# Patient Record
Sex: Male | Born: 1955
Health system: Southern US, Community
[De-identification: ages and names within clinical notes are randomized; demographics above are authoritative.]

## PROBLEM LIST (undated history)

## (undated) DIAGNOSIS — M1990 Unspecified osteoarthritis, unspecified site: Secondary | ICD-10-CM

## (undated) DIAGNOSIS — I509 Heart failure, unspecified: Secondary | ICD-10-CM

## (undated) DIAGNOSIS — R911 Solitary pulmonary nodule: Secondary | ICD-10-CM

## (undated) DIAGNOSIS — E7800 Pure hypercholesterolemia, unspecified: Secondary | ICD-10-CM

## (undated) DIAGNOSIS — I10 Essential (primary) hypertension: Secondary | ICD-10-CM

## (undated) DIAGNOSIS — Z87442 Personal history of urinary calculi: Secondary | ICD-10-CM

## (undated) HISTORY — DX: Essential (primary) hypertension: I10

## (undated) HISTORY — PX: KNEE ARTHROSCOPY: SHX127

## (undated) HISTORY — PX: JOINT REPLACEMENT: SHX530

## (undated) HISTORY — PX: TONSILLECTOMY: SUR1361

## (undated) HISTORY — PX: LITHOTRIPSY: SUR834

---
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
## 1995-04-05 HISTORY — PX: NASAL SEPTUM SURGERY: SHX37

## 1998-08-26 ENCOUNTER — Inpatient Hospital Stay (HOSPITAL_COMMUNITY): Admission: EM | Admit: 1998-08-26 | Discharge: 1998-08-27 | Payer: Self-pay | Admitting: Emergency Medicine

## 1998-08-26 ENCOUNTER — Encounter: Payer: Self-pay | Admitting: Emergency Medicine

## 1998-08-31 ENCOUNTER — Emergency Department (HOSPITAL_COMMUNITY): Admission: EM | Admit: 1998-08-31 | Discharge: 1998-08-31 | Payer: Self-pay | Admitting: Emergency Medicine

## 1998-08-31 ENCOUNTER — Encounter: Payer: Self-pay | Admitting: Emergency Medicine

## 1998-09-02 ENCOUNTER — Encounter: Payer: Self-pay | Admitting: *Deleted

## 1998-09-02 ENCOUNTER — Inpatient Hospital Stay (HOSPITAL_COMMUNITY): Admission: EM | Admit: 1998-09-02 | Discharge: 1998-09-05 | Payer: Self-pay | Admitting: Emergency Medicine

## 1998-09-02 ENCOUNTER — Encounter: Payer: Self-pay | Admitting: Emergency Medicine

## 1998-09-29 ENCOUNTER — Encounter: Payer: Self-pay | Admitting: Neurology

## 1998-09-29 ENCOUNTER — Inpatient Hospital Stay (HOSPITAL_COMMUNITY): Admission: EM | Admit: 1998-09-29 | Discharge: 1998-09-30 | Payer: Self-pay | Admitting: Emergency Medicine

## 1998-09-30 ENCOUNTER — Encounter: Payer: Self-pay | Admitting: Emergency Medicine

## 1999-02-22 ENCOUNTER — Emergency Department (HOSPITAL_COMMUNITY): Admission: EM | Admit: 1999-02-22 | Discharge: 1999-02-22 | Payer: Self-pay | Admitting: Emergency Medicine

## 1999-10-22 ENCOUNTER — Ambulatory Visit (HOSPITAL_COMMUNITY): Admission: RE | Admit: 1999-10-22 | Discharge: 1999-10-22 | Payer: Self-pay | Admitting: Orthopedic Surgery

## 2000-10-23 ENCOUNTER — Encounter: Payer: Self-pay | Admitting: Emergency Medicine

## 2000-10-23 ENCOUNTER — Emergency Department (HOSPITAL_COMMUNITY): Admission: EM | Admit: 2000-10-23 | Discharge: 2000-10-23 | Payer: Self-pay | Admitting: Emergency Medicine

## 2000-10-25 ENCOUNTER — Emergency Department (HOSPITAL_COMMUNITY): Admission: EM | Admit: 2000-10-25 | Discharge: 2000-10-25 | Payer: Self-pay | Admitting: Emergency Medicine

## 2002-08-15 ENCOUNTER — Emergency Department (HOSPITAL_COMMUNITY): Admission: EM | Admit: 2002-08-15 | Discharge: 2002-08-15 | Payer: Self-pay | Admitting: *Deleted

## 2002-08-15 ENCOUNTER — Encounter: Payer: Self-pay | Admitting: Emergency Medicine

## 2005-08-03 ENCOUNTER — Encounter: Payer: Self-pay | Admitting: Emergency Medicine

## 2005-10-19 ENCOUNTER — Encounter: Admission: RE | Admit: 2005-10-19 | Discharge: 2005-10-19 | Payer: Self-pay | Admitting: Internal Medicine

## 2005-12-12 ENCOUNTER — Emergency Department (HOSPITAL_COMMUNITY): Admission: EM | Admit: 2005-12-12 | Discharge: 2005-12-12 | Payer: Self-pay | Admitting: Emergency Medicine

## 2006-12-05 ENCOUNTER — Inpatient Hospital Stay (HOSPITAL_COMMUNITY): Admission: RE | Admit: 2006-12-05 | Discharge: 2006-12-08 | Payer: Self-pay | Admitting: Specialist

## 2007-09-21 ENCOUNTER — Ambulatory Visit: Payer: Self-pay | Admitting: Internal Medicine

## 2007-09-21 ENCOUNTER — Inpatient Hospital Stay (HOSPITAL_COMMUNITY): Admission: EM | Admit: 2007-09-21 | Discharge: 2007-09-23 | Payer: Self-pay | Admitting: Emergency Medicine

## 2008-01-02 ENCOUNTER — Inpatient Hospital Stay (HOSPITAL_COMMUNITY): Admission: EM | Admit: 2008-01-02 | Discharge: 2008-01-03 | Payer: Self-pay | Admitting: Emergency Medicine

## 2008-02-18 ENCOUNTER — Ambulatory Visit (HOSPITAL_COMMUNITY): Admission: RE | Admit: 2008-02-18 | Discharge: 2008-02-18 | Payer: Self-pay | Admitting: Internal Medicine

## 2008-06-20 ENCOUNTER — Emergency Department (HOSPITAL_COMMUNITY): Admission: EM | Admit: 2008-06-20 | Discharge: 2008-06-21 | Payer: Self-pay | Admitting: Emergency Medicine

## 2009-04-01 ENCOUNTER — Encounter: Admission: RE | Admit: 2009-04-01 | Discharge: 2009-04-01 | Payer: Self-pay | Admitting: Family Medicine

## 2009-10-28 ENCOUNTER — Encounter: Admission: RE | Admit: 2009-10-28 | Discharge: 2009-10-28 | Payer: Self-pay | Admitting: Internal Medicine

## 2010-03-28 ENCOUNTER — Emergency Department (HOSPITAL_BASED_OUTPATIENT_CLINIC_OR_DEPARTMENT_OTHER)
Admission: EM | Admit: 2010-03-28 | Discharge: 2010-03-28 | Payer: Self-pay | Source: Home / Self Care | Admitting: Emergency Medicine

## 2010-08-03 ENCOUNTER — Other Ambulatory Visit: Payer: Self-pay | Admitting: Internal Medicine

## 2010-08-03 ENCOUNTER — Ambulatory Visit
Admission: RE | Admit: 2010-08-03 | Discharge: 2010-08-03 | Disposition: A | Discharge status: Home / Self Care | Payer: Medicare Other | Source: Ambulatory Visit | Attending: Internal Medicine | Admitting: Internal Medicine

## 2010-08-03 DIAGNOSIS — R911 Solitary pulmonary nodule: Secondary | ICD-10-CM

## 2010-08-17 NOTE — Discharge Summary (Signed)
NAME:  Frederick Keller, FRIEDHOFF NO.:  [id number]   MEDICAL RECORD NO.:  [id number]          PATIENT TYPE:  INP   LOCATION:  6743                         FACILITY:  [facility]   PHYSICIAN:  Thora Lance, M.D.  DATE OF BIRTH:  February 19, 1956   DATE OF ADMISSION:  01/02/2008  DATE OF DISCHARGE:  01/03/2008                               DISCHARGE SUMMARY   REASON FOR ADMISSION:  Mr. Kozar is a 55 year old white male who  presented with a 1-week history of worsening productive cough, wheezing,  chills, nausea, and emesis.  The patient had a temperature of 102.1 one  week prior to admission with some generalized weakness and shortness of  breath.  The patient had been treated at [facility] with doxycycline and  a steroid taper with albuterol with no improvement.   SIGNIFICANT FINDINGS:  VITAL SIGNS:  Temperature 98.0, blood pressure  116/74, heart rate 103, respirations 22.  LUNGS:  Diffuse rhonchi and diffuse expiratory wheezes.  HEART:  Regular rate and rhythm.  ABDOMEN:  Benign.   LABORATORY DATA:  CPK-MB 329.05, sodium 136, potassium 3.4, chloride 98,  bicarbonate 28, BUN 20, creatinine 1.2, glucose 297, bilirubin 0.9, alk  phos 81, AST 38, ALT 43, calcium 9.3, albumin 3.6, total protein 6.4.  ABG, pH 7.42, PCO2 of 41, PO2 of 58.  CBC, WBC 11.0, hemoglobin 15.2,  platelet count 171.  Chest x-ray, no acute cardiopulmonary disease.  D-  dimer 0.29.   HOSPITAL COURSE:  The patient was admitted for asthmatic bronchitis.  He  was placed on IV steroids and Avelox.  He was given an influenza  vaccine.  After 1 day of hospitalization, the patient did feel  significantly better with less wheezing and cough.  His blood sugar was  noted to be elevated and hemoglobin A1c was drawn, it was 6.4.  This  will be followed up as an outpatient.  The patient was discharged in  improved condition.   DISCHARGE DIAGNOSES:  1. Asthmatic bronchitis.  2. Hyperglycemia.  3. Gout.  4.  Hyperlipidemia.   PROCEDURES:  None.   DISCHARGE MEDICATIONS:  1. Prednisone 20 mg 3 a day for 2 days, 2 a day for 2 days, 1 a day      for 2 days, one-half a day for 2 days, and then discontinue.  2. Avelox 400 mg daily 5 days.  3. Albuterol MDI 2 q.4 h.  4. Glimepiride 2 mg one-half a day if blood sugar greater than 200.  5. Allopurinol 300 mg a day.  6. Loratadine 10 mg a day.  7. Simvastatin 20 mg a day.   DISPOSITION:  Discharged to home.   FOLLOWUP:  In 2 weeks with Dr. Valentina Lucks.   ACTIVITY:  As tolerated.   DIET:  Regular diet.           ______________________________  Thora Lance, M.D.     JJG/MEDQ  D:  02/13/2008  T:  02/13/2008  Job:  [id number]

## 2010-08-17 NOTE — Discharge Summary (Signed)
NAME:  KAELIN, BONELLI NO.:  [id number]   MEDICAL RECORD NO.:  [id number]          PATIENT TYPE:  INP   LOCATION:  5151                         FACILITY:  [facility]   PHYSICIAN:  Thora Lance, M.D.  DATE OF BIRTH:  09-08-1955   DATE OF ADMISSION:  09/21/2007  DATE OF DISCHARGE:  09/23/2007                               DISCHARGE SUMMARY   DISCHARGE DIAGNOSES:  1. Severe lower laryngotracheobronchitis with hypoxia and      bronchospasm, resolved.  2. History of gout.  3. History of hypercholesterolemia.  4. History of obesity.   HOSPITAL COURSE:  Mr. Shawnn Bouillon is a pleasant 55 year old male who  for approximately a week prior to admission had developed a sore throat  and cough.  He had had a negative strep screen about 6 days prior to  admission and then throughout the week prior to admission he developed  progressive paroxysms of severe cough, nausea, vomiting, and wheezing.  He could not hold down liquids and food well and actually on the day of  admission visited an urgent care center and was found to be somewhat  hypotensive, though EKG and chest x-rays were unremarkable, his O2  saturation was only in the mid 80s.  He was referred to [facility]  Emergency Room for further evaluation, noted to be wheezing, and was  given several respiratory treatments in the emergency room, and  paroxysms of cough continued severely.  He was admitted for further  evaluation and workup.   D-dimer was normal to rule out PE, and BNP was less than 30.   During the hospitalization he did not respond well to simple nebulizer  treatment throughout the first night of admission, and on the second day  of admission he was started on intravenous Solu-Medrol 125 mg q.12 h and  wheezing and inflammation and cough markedly improved.   At the time of discharge, his O2 saturations on room air were 99%.  Vital signs were excellent with a pulse of 70 and regular, respiratory  rate 18  and nonlabored, blood pressure was 110/72, temperature 97.8, and  pulse 60 and regular.   DISCHARGE LABORATORIES:  Revealed the following:  BMET on September 23, 2007,  revealed a sodium 138, potassium 4.5, chloride 107, bicarb 23, glucose  182, BUN 9, creatinine 0.97.  CBC on September 23, 2007 revealed a white  count 10,300, hemoglobin 13.8, platelet count 140,000.  Uric acid level  was 6.3 - she is on allopurinol - normal and again D-dimers from June  /19, 2009 on the day of admission were 0.34 - within normal limits.   The patient was discharged home in improved condition and will be  discharged home on a prednisone taper 50 mg tapering down to 10 mg over  10 days and he can consider continue daily 12-hour oral Delsym for cough  which is  much improved regardless and I have given him some Restoril 15-30 mg 15  capsules to take h.s. as needed for insomnia which may be steroid  induced.   CONDITION ON DISCHARGE:  Much improved.  Candyce Churn, M.D.  Electronically Signed     ______________________________  Thora Lance, M.D.    RNG/MEDQ  D:  09/23/2007  T:  09/23/2007  Job:  [id number]

## 2010-08-17 NOTE — Op Note (Signed)
NAME:  Frederick Keller, Frederick Keller NO.:  [id number]   MEDICAL RECORD NO.:  [id number]          PATIENT TYPE:  INP   LOCATION:  0003                         FACILITY:  [facility]   PHYSICIAN:  Erasmo Leventhal, M.D.DATE OF BIRTH:  03/21/1956   DATE OF PROCEDURE:  12/05/2006  DATE OF DISCHARGE:                               OPERATIVE REPORT   PREOP DIAGNOSIS:  Right knee end-stage arthritis.   POSTOP DIAGNOSIS:  Right knee end-stage arthritis.   PROCEDURE:  Right total knee arthroplasty.   SURGEON:  Erasmo Leventhal, M.D.   ASSISTANT:  Jaquelyn Bitter. Chabon, P.A.-C.   ANESTHESIA:  Spinal with monitored anesthesia care   ESTIMATED BLOOD LOSS:  Less than 100 mL.   DRAINS:  One.   TOURNIQUET TIME:  75 minutes at 300 mmHg.   COMPLICATIONS:  None.   DISPOSITION:  To PACU stable.   OPERATIVE DETAILS:  The patient was counseled in the holding area.  IV  started, and chart was signed appropriately.  Patient was taken to  operating room.  IV antibiotics were given.  Spinal anesthetic was  administered and monitored anesthesia care.  Following this the Foley  catheter was placed utilizing sterile technique by the OR staff and  circulating nurse.  All extremities were well padded and bumped.  The  right knee was examined, had full extension.  He could flex to 120  degrees.  His leg was elevated, prepped, and DuraPrepped and draped in a  sterile fashion.  Exsanguinated with an Esmarch and tourniquet was  inflated to 300 mmHg.  Straight midline incision made at the skin and  subcutaneous tissue.  Midline soft tissue flaps were developed at the  appropriate level.  Medial parapatellar arthrotomy was performed; and a  proximal medial soft tissue release was done.   The knee was flexed.  Patella was retracted, but not everted.  Cruciate  ligaments were resected.  Arthritis, end-stage was identified.  Starter  hole made distal to the femur.  The canal was irrigated and effluent  was  clear.  An intramedullary rod was gently placed.  Also noted he had  tricompartmental end-stage arthritis changes.  I chose a 5-degrees  valgus cut and a 10-mm cut off the distal femur.  The distal femur was  found to be a size 4, rotation marks were set.  This was cut to fit a  size #4.  Medial menisci were removed under direct visualization.  Geniculate vessels were coagulated.   Tibial eminence was resected.  Osteophytes removed in the proximal and  medial tibia.  The tibia was subluxed anteriorly and found be a size 5.  Central aspect was identified.  Step reamer and canal was irrigated and  the effluent was clear; and a vented intramedullary rod was gently  placed.  It took a 0-degree slope with a 10-mm cut off the lateral side  which was the least sufficient side.  Posterior-medial and posterior  femur was identified; and a small amount of bone was removed after  better flexion.  Posterior vascular structures were __________ off and  protected throughout  the entire case.   At this time flexion-extension blocks were 10 insert and well balanced.  Tibial baseplate was applied.  Rotation cover set, reamer, and a step  reamer, and then a punch was performed.  Femoral box cut was now  performed.  At this time the size 5 tibia, size 4 femur with the 12.5  tibial insert with excellent motion, soft tissue balance, and alignment.  Patella was was sized to a 38.  Both bones were resected, locking holes  were made; and the patella button was tracking anatomically.  All trials  were now removed; and the knee was irrigated with pulsatile lavage.  Utilizing a modern DR cement technique, all components were cemented  into place:  A size 5 tibia, a size 4 femur, and a size 38 patella  cemented. After the cement had cured, excess cement was removed.  The  knee was again irrigated; and with a 12.5 trial we had full extension,  flexion to 120, limited by the drapes; varus and valgus stress was   stable.  He was well-balanced in flexion/extension; patellofemoral track  was anatomic.  Trial was then removed; and the knee was irrigated and a  final 12.5 anterior-posterior stabilized rotating platform insert was  implanted.   We had utilized [employer] [place] implants.  A size 4 femur, size 5 tibia with  a 38 all polyethylene patella, and a 12.5 posterior stabilized rotating  platform tibial insert.  A medium Hemovac drain was placed.  The wound  was copiously irrigated.  Sequential closure of the layers were done.  Arthrotomy Vicryl, subcu Vicryl, skin was closed with a subcuticular  Monocryl sutures.  Steri-Strips applied.  Drains hooked to suction.  Compression dressing was applied; and tourniquet was deflated.  The  patient had normal circulation of the foot and ankle in the case.  He  tolerated the procedure; there were no complications or problems.  He  was taken from the operating room to PACU in stable condition.   For surgical decision making, and assistance of Mr. Leilani Able, PA-C  was needed throughout the entire case.           ______________________________  Erasmo Leventhal, M.D.     RAC/MEDQ  D:  12/05/2006  T:  12/05/2006  Job:  [id number]

## 2010-08-17 NOTE — H&P (Signed)
NAME:  YAGO, LUDVIGSEN NO.:  [id number]   MEDICAL RECORD NO.:  [id number]          PATIENT TYPE:  INP   LOCATION:  5151                         FACILITY:  [facility]   PHYSICIAN:  Gordy Savers, MDDATE OF BIRTH:  February 20, 1956   DATE OF ADMISSION:  09/21/2007  DATE OF DISCHARGE:                              HISTORY & PHYSICAL   CHIEF COMPLAINT:  Refractory cough.   HISTORY OF PRESENT ILLNESS:  The patient is a 55 year old gentleman who  was well until 6 days ago at that time he developed sore throat, and the  following day had some associated cough.  He was seen at a local urgent  care center, where he had a negative strep screen.  He was treated  symptomatically at that time, and later developed worsening cough.  The  paroxysms of cough became quite severe and associated with episodes of  nausea and vomiting.  Four days ago, he became much weaker and returned  to the urgent care for reassessment.  At that time, he was noted to have  chest congestion and also gave a history of fever and chills with a  temperature as high as 100 degrees.  At that time, he was treated with a  Z-Pak and completed his final dose today.  He was also treated with a  codeine-based antitussive and had some associated dizziness and  weakness.  Yesterday, he developed cough that was slightly more  productive and yielded scanty amounts of green sputum.  Today, he  clinically worsened with dry largely nonproductive cough, but now  associated with much more dyspnea especially with exertion and also  periods of some audible wheezing.  He again returned to the urgent care  where he was noted be hypotensive.  Chest x-ray and EKG were  unremarkable, but due to intermittent low blood pressure readings as  well as hypoxemia with oxygen saturations to the mid 80s, he was  referred to the ED for further evaluation.  During the patient's ED  stay, he remained afebrile.  His other vital signs were quite  variable  with pulse rates from the mid 50s to the high 80s.  Intermittently, he  was hypotensive and required fluid resuscitation in the ED setting.  His  oxygen saturation was also quite labile with readings from the low 90s  and at times of 99% on room air.  His blood pressure and degree of  oxygen saturation seem to be associated with his paroxysms of coughing.  Laboratory evaluation revealed a normal white count of 8.1.  Chemistries  were unremarkable.  B natriuretic peptide was less than 30 and a D-dimer  was normal.  The patient is now admitted for further evaluation and  treatment of suspected viral tracheobronchitis.   PAST MEDICAL HISTORY:  Medical illnesses include a history of gout and  hypercholesterolemia.  He has a history of seasonal allergic rhinitis  and does have some chronic nasal obstructive symptoms.  At age 15, he  required significant nasal reconstructive surgery due to a facial  trauma.  He had an incidental tonsillectomy at that time.  He was last  admitted to hospital in September of last year for right total knee  replacement surgery, prior to this he has had bilateral knee  arthroscopies in the past.  In 2000, he had an extensive neurologic  evaluation for a functional left-sided weakness with negative results.   FAMILY HISTORY:  Father died at age 57 of a massive MI.  Mother is aged  33 with a history of osteoarthritis and macular degeneration.  Two  brothers, three sisters, positive hypertension and asthma.   SOCIAL HISTORY:  The patient is married, two adult children, one  grandchild.  He discontinued tobacco use in 1997.   REVIEW OF SYSTEMS:  Exam is otherwise unremarkable except as mentioned  in the history of present illness.  He was clinically well until 6 days  prior to admission.  He denies any recent change in his weight.  He  denies any history of cardiac disease or exertional chest pain.  There  has been no change in his bowel habits or history  of colonic polyps.  Neuropsychiatric unremarkable.  He was treated briefly with Paxil in  2000 for a suspected depression.   PHYSICAL EXAMINATION:  VITAL SIGNS:  Blood pressure 110/70, pulse rate  72, O2 saturation 98% on 1 liter of oxygen via nasal cannula.  General:  Revealed a well-developed, moderately overweight male in no  acute distress at rest.  During the exam and interview, he had frequent  paroxysms of vigorous coughing.  SKIN:  Unremarkable without rash.  HEAD/NECK:  Revealed normal conjunctivae.  Sclerae are slightly  injected.  Pupil responses were normal.  Ear, nose, and throat  unremarkable.  Dentures in place.  Mucous membrane appeared well-  hydrated.  Neck, no bruits or adenopathy.  CHEST:  Revealed clear breath sounds at the bases in the mid and upper  lung field.  He had some scattered rhonchi, more marked in expiration.  There is no audible wheezing.  CARDIOVASCULAR:  Revealed normal S1 and S2.  No murmurs or gallops  noted.  Rhythm was regular.  ABDOMEN:  Obese, soft, and nontender.  No organomegaly.  External Genitalia:  Normal.  EXTREMITIES:  Revealed no edema.  Peripheral pulses full.  There was a  surgical scar involving the anterior right knee.   IMPRESSION:  Suspect viral tracheobronchitis with transient hypoxemia  and labile blood pressure with periods of hypotension.   DISPOSITION:  We will admit to the hospital, support with IV fluids and  oxygen as needed.  He received intensive inhalational bronchodilators.  We will also replace on antitussives and mucolytics.  The patient  presently has no wheezing and steroid therapy will be held at this time.      Gordy Savers, MD  Electronically Signed     PFK/MEDQ  D:  09/21/2007  T:  09/22/2007  Job:  [id number]

## 2010-08-17 NOTE — H&P (Signed)
NAME:  KORAN, SEABROOK NO.:  [id number]   MEDICAL RECORD NO.:  [id number]           PATIENT TYPE:   LOCATION:                                 FACILITY:   PHYSICIAN:  Ramiro Harvest, MD    DATE OF BIRTH:  1956-03-02   DATE OF ADMISSION:  01/02/2008  DATE OF DISCHARGE:                              HISTORY & PHYSICAL   PRIMARY CARE PHYSICIAN:  Thora Lance, M.D. of [place].   HISTORY OF PRESENT ILLNESS:  [name] is a 55 year old white  gentleman with a history of gout, hyperlipidemia, remote history of  tobacco abuse, hospitalized September 21, 2007 to September 23, 2007 with severe  lower laryngeal tracheobronchitis with hypoxia and bronchospasm, also a  history of obesity.  He presented to the ED from [facility] with a 1-week  history of worsening nonproductive cough, wheezing, chills, nausea and  emesis secondary to his cough, and generalized weakness.  The patient  stated that he had had a temp of 102.1 one week prior to admission with  some associated shortness of breath 1 week prior to admission which has  since resolved.  The patient presented to [facility] 1 week prior to  admission and was treated for a bronchitis with doxycycline and a  steroid taper, albuterol inhaler and Tussionex, with no improvement in  symptoms.  The patient presented back to [facility] for followup and was  sent to the ED for further evaluation.  In the ED, chest x-ray with no  acute cardiopulmonary disease.  Comprehensive metabolic profile with a  potassium of 3.4, otherwise the rest of the comprehensive metabolic  profile was normal.  D-dimer at 0.29.  Point of care cardiac markers  negative x1.  CBC with a white count of 11.0, hemoglobin 15.2, platelets  171, hematocrit 46.5, ANC of 10.  ABG with pH of 7.42, pCO2 of 41, pO2  of 58, bicarb of 26, satting 90%.  The patient was given some nebulizer  treatments over 1 hour in the ED with no relief in his wheezing.  We  were  called to admit the patient for further evaluation and management.   ALLERGIES:  DILAUDID LEADS TO EMESIS.  PENICILLIN CAUSES HIVES.   PAST MEDICAL HISTORY:  1. Gout.  2. Hyperlipidemia.  3. SEASONAL ALLERGIC RHINITIS.  4. Chronic nasal obstructive symptoms.  5. Status post nasal reconstructive surgery secondary to facial trauma      at age 69.  65. Status post tonsillectomy at age 81.  35. Status post total right knee replacement in September 2008 per Dr.      Thomasena Edis.  8. History of recurrent bronchitis.   HOME MEDICATIONS:  1. Allopurinol 300 mg daily.  2. Claritin 10 mg daily.  3. Zocor 20 mg daily.  4. Doxycycline 100 mg b.i.d.  5. Mucinex as needed.  6. Indocin as needed.   SOCIAL HISTORY:  The patient is married, has 2 adult children and 1  grandchild, all of whom are healthy.  The patient has a prior tobacco  history of 14 pack-years, however, discontinued tobacco use in  1997.  No  alcohol use.  No IV drug use.   FAMILY HISTORY:  Father deceased at age 43 from acute MI.  Mother age 62  with a history of osteoarthritis and macular degeneration.  The patient  has 2 brothers and 3 sisters with history positive for hypertension and  asthma.   REVIEW OF SYSTEMS:  As per HPI, otherwise negative.   PHYSICAL EXAM:  Temperature 98.0, blood pressure 116/74, pulse of 103,  respiratory rate 22, satting 94% on room air.  GENERAL:  Patient lying on gurney in no apparent distress.  HEENT:  Normocephalic, atraumatic.  Pupils equal, round and reactive to  light.  Extraocular movements intact.  Oropharynx is clear.  No lesions,  no exudates.  NECK:  Supple.  No lymphadenopathy.  RESPIRATORY:  Diffuse rhonchi, diffuse expiratory wheezes.  CARDIOVASCULAR:  Regular rate and rhythm.  No murmurs, rubs or gallops.  ABDOMEN:  Soft, obese, nontender, nondistended.  Positive bowel sounds.  EXTREMITIES:  No clubbing, cyanosis or edema.  NEUROLOGICALLY:  The patient is alert and oriented x3.   Cranial nerves  II-XII are grossly intact.  No focal deficits.   LABS:  Point of care cardiac markers:  CK-MB went from 3.0 to 3.0,  myoglobin went from 145 to 134, troponin I was from 0.05 to 0.05.  Sodium 136, potassium 3.4, chloride 98, bicarb 28, BUN 20, creatinine  1.27, glucose 297, bilirubin 0.9, alk phosphatase 81, AST 38, ALT 43, D-  dimer 0.29, calcium of 9.3, albumin 3.6, protein of 6.4.  ABG:  pH of  7.42, pCO2 of 41, pO2 of 58, bicarb of 26, satting 90%.  CBC:  White  count 11.0, hemoglobin 15.2, hematocrit 46.5, platelets 171, ANC of  10.0.  Chest x-ray:  No acute cardiopulmonary disease, stable appearance  of the chest.   ASSESSMENT AND PLAN:  [name] is a 55 year old gentleman, history  of recurrent bronchitis, history of laryngeal tracheobronchitis with  hypoxia in June 2009 who presents to the ED with a 1-week history of  worsening shortness of breath, some chills, nausea, wheezing, diffusely  generalized weakness, and had a temp of 102.1 one week prior to  admission.   1. Recurrent bronchitis:  We will admit the patient to telemetry.      Check a sputum Gram stain and culture.  Check UA with cultures and      sensitivities.  Cycle cardiac enzymes q.12 h x2.  Check a magnesium      level.  Will place on IV Solu-Medrol 125 q.8 h, Avelox 400 mg IV      daily, nebulizer treatment, Tessalon Perles and oxygen.  If no      improvement in symptoms may consider CT of the chest for further      evaluation.  2. Leukocytosis, likely secondary to #1:  Chest x-ray is negative for      infiltrate.  Will check a UA culture and sensitivity.  Check a      sputum Gram stain and culture.  Will place on empiric IV      antibiotics of Avelox and follow.  3. Hypokalemia secondary to nebulizer treatment:  Will replete.  4. Gout:  Allopurinol.  5. Hyperlipidemia:  Zocor.  6. SEASONAL ALLERGIES:  Claritin.  7. Prophylaxis:  Protonix for GI prophylaxis, SCDs for DVT       prophylaxis.   It has been a pleasure taking care of [name].      Ramiro Harvest, MD  Electronically Signed     DT/MEDQ  D:  01/02/2008  T:  01/02/2008  Job:  [id number]   cc:   Thora Lance, M.D.

## 2010-08-20 NOTE — H&P (Signed)
NAME:  Frederick Keller, RICHTER NO.:  [id number]   MEDICAL RECORD NO.:  [id number]        PATIENT TYPE:  LINP   LOCATION:                               FACILITY:  [facility]   PHYSICIAN:  Erasmo Leventhal, M.D.DATE OF BIRTH:  July 04, 1955   DATE OF ADMISSION:  12/05/2006  DATE OF DISCHARGE:                              HISTORY & PHYSICAL   DATE OF SURGERY:  12/05/2006.   CHIEF COMPLAINT:  Osteoarthritis right knee.   HISTORY OF PRESENT ILLNESS:  This is a 55 year old gentleman with a  history of post traumatic arthritis of his right knee that has failed  conservative treatment of injections, arthroscopy, chondroplasty  debridement and Hyalgan.  After discussion of treatment options, risks  and benefits, the patient is now scheduled for total knee arthroplasty.  He has had medical clearance from his medical doctor at [facility] and  surgery will go ahead as scheduled.   PAST MEDICAL HISTORY:  Drug allergy PENICILLIN with a rash.   CURRENT MEDICATIONS:  Allopurinol 300 mg daily and Aleve.   PAST SURGERIES:  Include bilateral knee arthroscopies and serious  medical illnesses include gout.   FAMILY HISTORY:  Positive for diabetes and MI.   SOCIAL HISTORY:  The patient is married.  He works.  He does not smoke  and does not drink.   REVIEW OF SYSTEMS:  CENTRAL NERVOUS SYSTEM: Negative for headache,  blurred  vision or dizziness.  PULMONARY: No shortness breath, PND or  orthopnea.  CARDIOVASCULAR:  No chest pain, palpitation.  GI: Negative  for ulcers, hepatitis.  GU: Negative for urinary tract difficulty.  MUSCULOSKELETAL:  Positive in HPI.   PHYSICAL EXAM:  BP 130/88, respirations 18, pulse 72 and regular.  GENERAL APPEARANCE:  This is a well-developed, well-nourished gentleman  in no acute distress.  HEENT: Head normocephalic.  Nose patent.  Ears patent.  Pupils equal,  round, react to light.  Throat without injection.  NECK: Supple without adenopathy.  Carotids 2+  without bruit.  CHEST: Clear to auscultation.  No rales or rhonchi.  Respirations 18.  HEART:  Regular rate and rhythm at 72 beats per minute,  without murmur.  ABDOMEN: Soft with active bowel sounds.  No masses or organomegaly.  NEUROLOGIC:  Patient alert and oriented to time, place and person.  Cranial nerves II-XII grossly intact.  EXTREMITIES:  Shows the right knee with a varus deformity 0 to 125  degrees range of motion.  Dorsalis pedis, posterior tibialis pulses 2+.  X-ray shows osteoarthritis of the right knee.   IMPRESSION:  Osteoarthritis right knee.   PLAN:  Total knee arthroplasty right knee.      Jaquelyn Bitter. Chabon, P.A.    ______________________________  Erasmo Leventhal, M.D.    SJC/MEDQ  D:  11/20/2006  T:  11/20/2006  Job:  [id number]

## 2010-08-20 NOTE — Consult Note (Signed)
[place]  Patient:    Frederick Keller                        MRN: [id number] Proc. Date: 02/22/99 Adm. Date:  [id number] Attending:  Hilario Quarry CC:         Roland Earl, M.D. - [facility]                          Consultation Report  CHIEF COMPLAINT:  Headaches, dizziness, and left-sided weakness.  HISTORY OF PRESENT ILLNESS:  Patient is a 55 year old man who earlier this afternoon was at his work, in the break room, and developed sudden onset of dizziness, vertigo, followed by a sharp, throbbing occipitoparietal bilateral headache, facial numbness, confusion and weakness of his left upper extremity and left lower extremity.  Patient was transported via EMS with concerns of him having stroke and arrived to the ER where a "code stroke" was activated for immediate evaluation.  This patient has had previous evaluations and testing back in May nd June of 2000 by Dr. Lesia Sago, Dr. Avie Echevaria, and Dr. Roland Earl, from [facility], for history of a functional left-sided weakness, pseudoseizures, conversion reaction, and shaking spells.  He has undergone extensive neurological workup and testing including MRI/MRA of the brain, electroencephalograms, without finding any organic causes of his problems.  He lso has been evaluated by a psychiatrist and was being treated with Paxil 30 mg q.h.s. that he discontinued over the summer because of perfuse sweating.  There is no history of other recent illnesses, no fever, no trauma.  PAST MEDICAL HISTORY:  Gout.  MEDICATIONS:  Currently on no medications.  ALLERGIES:  PENICILLIN.  SOCIAL HISTORY:  Lives with his wife, nonsmoker, nondrinker.  REVIEW OF SYSTEMS:  As per history of present illness.  PHYSICAL EXAMINATION:  VITAL SIGNS:  Blood pressure 137/82, pulse 84, respirations 16, afebrile.  GENERAL:  A well-developed man in no acute  distress.  HEAD:  Normocephalic, atraumatic.  Eyes, ears, nose and throat were normal.  NECK:  Supple.  No bruits, no lymphadenopathy, no JVD.  LUNGS:  Clear bilaterally.  HEART:  Sounds regular rhythm with no murmurs.  ABDOMEN:  Soft, bowel sounds present.  No hepatosplenomegaly.  EXTREMITIES:  Positive pulses bilaterally.  No cyanosis or edema.  NEUROLOGIC:  He is awake, alert.  He is oriented to place, to month and year, he could recognize family members.  He did have some problems with short-term memory and mathematical ability.  His speech is slow.  Pupils equal, round and reactive bilaterally.  Extraocular cephalic movements intact.  Face symmetric, tongue midline, palate elevates symmetrically.  Hearing intact.  MOTOR EXAMINATION:  Displays a left hemiparesis with a giveaway weakness and positive Hoovers sign, functional weakness.  Reflexes equal throughout. Plantars downgoing.  Coordination is normal.  SENSORY:  Subjective decreased sensation to face and left side of his body.  TESTING/NEUROIMAGING:  I have personally reviewed CT scan of the patients brain  which is essentially normal.  There is no acute ischemic changes, no hypodensities, no hemorrhage, no hydrocephalus.  IMPRESSION: 1. Headaches. 2. Left-sided weakness, transient, functional.  PLAN/RECOMMENDATIONS:  The diagnosis, condition and further interventions were  discussed along with the patient and his wife at the bedside.  I have also discussed on the phone with Dr. Roland Earl in regards to this patients condition, to consider further evaluation in  regards to complicated migraines. At this time he does not warrant any further investigations and he will be sent home in stable condition, to follow up with Dr. Roland Earl tomorrow.  He has a scheduled appointment at 3:30 p.m., February 23, 1999.  He was given a dose of Toradol IV 30 mg in the emergency room. DD:  02/22/99 TD:  02/22/99 Job:  [id number]

## 2010-08-20 NOTE — Discharge Summary (Signed)
NAME:  Frederick Keller, Frederick Keller NO.:  [id number]   MEDICAL RECORD NO.:  [id number]          PATIENT TYPE:  INP   LOCATION:  1609                         FACILITY:  [facility]   PHYSICIAN:  Erasmo Leventhal, M.D.DATE OF BIRTH:  12/06/1955   DATE OF ADMISSION:  12/05/2006  DATE OF DISCHARGE:  12/08/2006                               DISCHARGE SUMMARY   ADMISSION DIAGNOSIS:  End-stage osteoarthritis, right knee.   DISCHARGE DIAGNOSIS:  End-stage osteoarthritis, right knee.   OPERATION:  Total knee arthroplasty, right knee.   BRIEF HISTORY:  This is a 55 year old gentleman with a history of post-  traumatic arthritis of his right knee who failed conservative  management.  After discussion of treatment options, the patient now for  total knee arthroplasty.   LABORATORY VALUES:  Admission CBC within normal limits.  Hemoglobin/hematocrit reached a low of 12 and 34.8 on the 5th.  PT/PTT  within normal limits.  BMET initially within normal limits with the  exception of elevated glucose at 122.  He ran mildly elevated glucose  throughout admission, less than 122, and otherwise had normal levels.  Urinalysis showed a small amount of hemoglobin and protein of 30.   COURSE IN THE HOSPITAL:  The patient tolerated the operative procedure  well.  The first postoperative day he was feeling okay; moderate pain  and nausea.  Vital signs stable, afebrile.  I&O was good.  Minimal  drainage.  Sodium 133, glucose 109.  Dressing was dry.  Drain was  removed.  Lungs were clear.  Heart sounds were normal.  Bowel sounds  active.  Calves were negative.  He was put on 1000-mL free water  restriction.  The second postoperative day, vital signs were stable.  He  was afebrile.  Dressing was changed.  Wound looked excellent no calf  tenderness, no cords.  Negative Homans sign.  Sodium was back to normal,  and plans were made for discharge in the a.m., and on the third  postoperative day he was doing  very well.  His vital signs were stable.  His wound was benign.  Calves were negative.  Hemoglobin and hematocrit  were okay, and the patient was subsequently discharged home for followup  in the office.   CONDITION ON DISCHARGE:  Improved.   DISCHARGE MEDICATIONS:  1. Percocet 5/325 one to two q.4-6 h  p.r.n. pain.  2. Robaxin 500 one p.o. q.8 h  p.r.n. spasm.  3. Lovenox 30 mg subcu q.12 h for 7 more days.  4. Trinsicon 1 b.i.d.   DISCHARGE INSTRUCTIONS:  He was instructed to keep the wound clean and  dry.  Keep the leg elevated.  Use ice.  Do his therapy as directed and  call if problems or questions arise.      Jaquelyn Bitter. Chabon, P.A.    ______________________________  Erasmo Leventhal, M.D.    SJC/MEDQ  D:  12/25/2006  T:  12/26/2006  Job:  [id number]

## 2010-12-30 LAB — BASIC METABOLIC PANEL
BUN: 11
BUN: 11
BUN: 9
Calcium: 9
Chloride: 105
Chloride: 106
GFR calc non Af Amer: >60
GFR calc non Af Amer: >60
Glucose, Bld: 112 — ABNORMAL HIGH
Glucose, Bld: 182 — ABNORMAL HIGH
Glucose, Bld: 96
Potassium: 3.8
Potassium: 3.9

## 2010-12-30 LAB — DIFFERENTIAL
Basophils Absolute: 0.1
Basophils Relative: 1
Eosinophils Absolute: 0.1
Eosinophils Relative: 1
Lymphocytes Relative: 24
Lymphs Abs: 1.9
Monocytes Absolute: 0.6
Monocytes Relative: 8
Neutro Abs: 5.4
Neutrophils Relative %: 66

## 2010-12-30 LAB — COMPREHENSIVE METABOLIC PANEL WITH GFR
Alkaline Phosphatase: 81
BUN: 16
CO2: 29
Calcium: 9.3
GFR calc non Af Amer: >60
Glucose, Bld: 87
Total Protein: 6.8

## 2010-12-30 LAB — URINALYSIS, ROUTINE W REFLEX MICROSCOPIC
Glucose, UA: NEGATIVE
Hgb urine dipstick: NEGATIVE
Ketones, ur: 15 — AB
Leukocytes, UA: NEGATIVE
Nitrite: NEGATIVE
Protein, ur: 30 — AB
Specific Gravity, Urine: 1.03
Urobilinogen, UA: 1
pH: 5.5

## 2010-12-30 LAB — CBC
HCT: 39.8
HCT: 40.2
HCT: 43.9
Hemoglobin: 13.7
Hemoglobin: 15.4
MCHC: 34.1
MCHC: 35
MCV: 97.4
MCV: 97.8
Platelets: 140 — ABNORMAL LOW
Platelets: 174
RBC: 4.51
RDW: 14.1
RDW: 14.1
WBC: 8.1

## 2010-12-30 LAB — COMPREHENSIVE METABOLIC PANEL
ALT: 51
AST: 50 — ABNORMAL HIGH
Albumin: 4.1
Chloride: 104
Creatinine, Ser: 1.19
Potassium: 4.4
Sodium: 141
Total Bilirubin: 1.3 — ABNORMAL HIGH

## 2010-12-30 LAB — URINE MICROSCOPIC-ADD ON

## 2010-12-30 LAB — B-NATRIURETIC PEPTIDE (CONVERTED LAB): Pro B Natriuretic peptide (BNP): <30

## 2010-12-30 LAB — D-DIMER, QUANTITATIVE

## 2011-01-03 LAB — POCT I-STAT 3, ART BLOOD GAS (G3+)
Acid-Base Excess: 2
Patient temperature: 98.6

## 2011-01-03 LAB — CBC
HCT: 46.5
HCT: 41.8
Hemoglobin: 15.2
Hemoglobin: 14
MCHC: 33.5
MCV: 100.4 — ABNORMAL HIGH
MCV: 100.5 — ABNORMAL HIGH
Platelets: 171
Platelets: 168
RBC: 4.16 — ABNORMAL LOW
RDW: 13.9
RDW: 13.9
WBC: 20 — ABNORMAL HIGH

## 2011-01-03 LAB — POCT CARDIAC MARKERS: Myoglobin, poc: 134

## 2011-01-03 LAB — DIFFERENTIAL
Basophils Absolute: 0.1
Basophils Relative: 1
Lymphocytes Relative: 8 — ABNORMAL LOW
Neutro Abs: 10 — ABNORMAL HIGH
Neutrophils Relative %: 91 — ABNORMAL HIGH

## 2011-01-03 LAB — BASIC METABOLIC PANEL
BUN: 19
CO2: 26
Calcium: 9.5
Chloride: 101
Creatinine, Ser: 1.08
GFR calc Af Amer: >60
GFR calc non Af Amer: >60
Glucose, Bld: 317 — ABNORMAL HIGH
Potassium: 4.9
Sodium: 136

## 2011-01-03 LAB — URINALYSIS, ROUTINE W REFLEX MICROSCOPIC
Glucose, UA: >1000 — AB
Ketones, ur: NEGATIVE
Leukocytes, UA: NEGATIVE
pH: 5

## 2011-01-03 LAB — URINE MICROSCOPIC-ADD ON

## 2011-01-03 LAB — GLUCOSE, CAPILLARY
Glucose-Capillary: 329 — ABNORMAL HIGH
Glucose-Capillary: 309 — ABNORMAL HIGH

## 2011-01-03 LAB — COMPREHENSIVE METABOLIC PANEL
Albumin: 3.6
BUN: 20
CO2: 28
Calcium: 9.3
Chloride: 98
Creatinine, Ser: 1.27
GFR calc non Af Amer: 60 — ABNORMAL LOW
Total Bilirubin: 0.9

## 2011-01-03 LAB — D-DIMER, QUANTITATIVE: D-Dimer, Quant: 0.29

## 2011-01-03 LAB — CK TOTAL AND CKMB (NOT AT ARMC)
CK, MB: 3.3
CK, MB: 5.3 — ABNORMAL HIGH
Relative Index: INVALID
Total CK: 65

## 2011-01-03 LAB — TROPONIN I: Troponin I: 0.01

## 2011-01-03 LAB — HEMOGLOBIN A1C
Hgb A1c MFr Bld: 6.4 — ABNORMAL HIGH
Mean Plasma Glucose: 137

## 2011-01-03 LAB — URINE CULTURE

## 2011-01-03 LAB — EXPECTORATED SPUTUM ASSESSMENT W GRAM STAIN, RFLX TO RESP C

## 2011-01-03 LAB — PROTIME-INR: Prothrombin Time: 13.4

## 2011-01-03 LAB — CULTURE, RESPIRATORY W GRAM STAIN

## 2011-01-03 LAB — MAGNESIUM: Magnesium: 2

## 2011-01-14 LAB — CBC
HCT: 34.8 — ABNORMAL LOW
HCT: 37.7 — ABNORMAL LOW
Hemoglobin: 12 — ABNORMAL LOW
MCV: 98.6
Platelets: 138 — ABNORMAL LOW
Platelets: 158
Platelets: 174
RBC: 3.88 — ABNORMAL LOW
RDW: 13.9
RDW: 13.9
WBC: 9
WBC: 9.2
WBC: 8.6

## 2011-01-14 LAB — BASIC METABOLIC PANEL
BUN: 12
CO2: 30
Calcium: 8.2 — ABNORMAL LOW
Calcium: 8.7
Chloride: 98
GFR calc Af Amer: >60
GFR calc Af Amer: >60
GFR calc non Af Amer: >60
GFR calc non Af Amer: >60
GFR calc non Af Amer: >60
Potassium: 4.3
Potassium: 4.5
Potassium: 4.5
Sodium: 133 — ABNORMAL LOW
Sodium: 136

## 2011-01-14 LAB — URINALYSIS, ROUTINE W REFLEX MICROSCOPIC
Bilirubin Urine: NEGATIVE
Nitrite: NEGATIVE
Specific Gravity, Urine: 1.019
pH: 6

## 2011-01-14 LAB — URINE MICROSCOPIC-ADD ON

## 2011-01-14 LAB — DIFFERENTIAL
Basophils Absolute: 0
Eosinophils Relative: 2
Lymphocytes Relative: 28
Lymphs Abs: 2.4
Monocytes Absolute: 0.7
Monocytes Relative: 9
Neutro Abs: 5.3

## 2011-01-14 LAB — ABO/RH: ABO/RH(D): O POS

## 2011-01-14 LAB — APTT: aPTT: 28

## 2011-01-14 LAB — COMPREHENSIVE METABOLIC PANEL
AST: 35
Albumin: 4.1
BUN: 10
Chloride: 103
Creatinine, Ser: 1.14
GFR calc Af Amer: >60
Total Bilirubin: 0.8
Total Protein: 7.5

## 2011-01-14 LAB — TYPE AND SCREEN: Antibody Screen: NEGATIVE

## 2011-06-02 ENCOUNTER — Emergency Department (HOSPITAL_COMMUNITY): Payer: Medicare Other

## 2011-06-02 ENCOUNTER — Emergency Department (HOSPITAL_COMMUNITY)
Admission: EM | Admit: 2011-06-02 | Discharge: 2011-06-03 | Disposition: A | Discharge status: Home / Self Care | Payer: Medicare Other | Attending: Emergency Medicine | Admitting: Emergency Medicine

## 2011-06-02 ENCOUNTER — Encounter (HOSPITAL_COMMUNITY): Payer: Self-pay | Admitting: Emergency Medicine

## 2011-06-02 DIAGNOSIS — K5730 Diverticulosis of large intestine without perforation or abscess without bleeding: Secondary | ICD-10-CM | POA: Diagnosis not present

## 2011-06-02 DIAGNOSIS — E7800 Pure hypercholesterolemia, unspecified: Secondary | ICD-10-CM | POA: Insufficient documentation

## 2011-06-02 DIAGNOSIS — K7689 Other specified diseases of liver: Secondary | ICD-10-CM | POA: Diagnosis not present

## 2011-06-02 DIAGNOSIS — R1031 Right lower quadrant pain: Secondary | ICD-10-CM | POA: Diagnosis not present

## 2011-06-02 DIAGNOSIS — N200 Calculus of kidney: Secondary | ICD-10-CM | POA: Insufficient documentation

## 2011-06-02 DIAGNOSIS — R10813 Right lower quadrant abdominal tenderness: Secondary | ICD-10-CM | POA: Diagnosis not present

## 2011-06-02 DIAGNOSIS — R110 Nausea: Secondary | ICD-10-CM | POA: Diagnosis not present

## 2011-06-02 DIAGNOSIS — Z79899 Other long term (current) drug therapy: Secondary | ICD-10-CM | POA: Insufficient documentation

## 2011-06-02 HISTORY — DX: Pure hypercholesterolemia, unspecified: E78.00

## 2011-06-02 LAB — BASIC METABOLIC PANEL
BUN: 14 mg/dL (ref 6–23)
CO2: 28 mEq/L (ref 19–32)
Calcium: 9.2 mg/dL (ref 8.4–10.5)
Chloride: 97 mEq/L (ref 96–112)
Creatinine, Ser: 0.86 mg/dL (ref 0.50–1.35)
GFR calc Af Amer: >90 mL/min (ref >90)
GFR calc non Af Amer: >90 mL/min (ref >90)
Glucose, Bld: 207 mg/dL — ABNORMAL HIGH (ref 70–99)
Potassium: 3.8 mEq/L (ref 3.5–5.1)
Sodium: 134 mEq/L — ABNORMAL LOW (ref 135–145)

## 2011-06-02 LAB — CBC
HCT: 44 % (ref 39.0–52.0)
Hemoglobin: 15.3 g/dL (ref 13.0–17.0)
MCH: 33.3 pg (ref 26.0–34.0)
MCHC: 34.8 g/dL (ref 30.0–36.0)
MCV: 95.7 fL (ref 78.0–100.0)
Platelets: 137 10*3/uL — ABNORMAL LOW (ref 150–400)
RBC: 4.6 MIL/uL (ref 4.22–5.81)
RDW: 12.8 % (ref 11.5–15.5)
WBC: 9.6 10*3/uL (ref 4.0–10.5)

## 2011-06-02 MED ORDER — IOHEXOL 300 MG/ML  SOLN
100.0000 mL | Freq: Once | INTRAMUSCULAR | Status: AC | PRN
  Administered 2011-06-02: 100 mL via INTRAVENOUS

## 2011-06-02 MED ORDER — MORPHINE SULFATE 4 MG/ML IJ SOLN
8.0000 mg | Freq: Once | INTRAMUSCULAR | Status: AC
  Administered 2011-06-02: 8 mg via INTRAVENOUS
  Filled 2011-06-02: qty 2

## 2011-06-02 MED ORDER — SODIUM CHLORIDE 0.9 % IV BOLUS (SEPSIS)
1000.0000 mL | Freq: Once | INTRAVENOUS | Status: AC
  Administered 2011-06-02: 1000 mL via INTRAVENOUS

## 2011-06-02 MED ORDER — ONDANSETRON HCL 4 MG/2ML IJ SOLN
4.0000 mg | Freq: Once | INTRAMUSCULAR | Status: AC
  Administered 2011-06-02: 4 mg via INTRAVENOUS
  Filled 2011-06-02: qty 2

## 2011-06-02 NOTE — ED Notes (Signed)
Patient transported to CT 

## 2011-06-02 NOTE — ED Notes (Signed)
Pt alert, nad, c/o RLQ pain, onset this evening, sent per PCP, resp even unlabored, skin pwd, + WBC, c/o nausea, no emesis, last meal 1300 today

## 2011-06-02 NOTE — ED Notes (Signed)
Pt denies any v/d. Pt complains of persistent nausea.

## 2011-06-02 NOTE — ED Notes (Signed)
ZOX:[id number]<AV> Expected date:<BR> Expected time:<BR> Means of arrival:<BR> Comments:<BR> Hold for verdin

## 2011-06-02 NOTE — ED Notes (Signed)
Pt states he was lying down when he felt a sudden pain in his RLQ. Pt denies pain radiating anywhere. Tender to palpation. Pt has a hx of kidney stone. Pt states he went to his PCP. Lab test at PCP showed hematuria, increased WBC, and negative x-ray scans. Pt is A&O x 4 with wife at bedside. Pt placed on continuous pulse ox. 97% RA. HR 90

## 2011-06-02 NOTE — ED Provider Notes (Signed)
History    56 year old male with abdominal pain. Gradual onset around lunchtime today. Relatively constant since onset. Pain is in the right lower quadrant. Does not radiate. No appreciable exacerbating relieving factors. Feels achy. Mild nausea. No vomiting. No diarrhea. No urinary complaints. Anorexia. Patient was evaluated at an outside facility and referred to the emergency room for evaluation of possible appendicitis. Lab work there was reviewed and shows a mild leukocytosis and small amount of blood on UA. Patient has a history kidney stones but states this this feels different. Denies history of abdominal surgery.   CSN: [id number]  Arrival date & time 06/02/11  2114   First MD Initiated Contact with Patient 06/02/11 2128      Chief Complaint  Patient presents with  . Abdominal Pain    (Consider location/radiation/quality/duration/timing/severity/associated sxs/prior treatment) HPI  Past Medical History  Diagnosis Date  . Renal disorder   . Gout   . High cholesterol     Past Surgical History  Procedure Date  . Joint replacement     No family history on file.  History  Substance Use Topics  . Smoking status: Never Smoker   . Smokeless tobacco: Not on file  . Alcohol Use: No      Review of Systems   Review of symptoms negative unless otherwise noted in HPI.   Allergies  Dilaudid and Penciclovir  Home Medications   Current Outpatient Rx  Name Route Sig Dispense Refill  . ALLOPURINOL 300 MG PO TABS Oral Take 300 mg by mouth daily.    Marland Kitchen CRANBERRY 1000 MG PO CAPS Oral Take 1,000 mg by mouth daily.    Marland Kitchen LORATADINE 10 MG PO TABS Oral Take 10 mg by mouth daily.    Marland Kitchen NAPROXEN SODIUM 220 MG PO TABS Oral Take 220 mg by mouth 2 (two) times daily with a meal.    . PRAVASTATIN SODIUM 20 MG PO TABS Oral Take 20 mg by mouth daily.      BP 115/69  Pulse 95  Temp 98 F (36.7 C)  Resp 16  Wt 245 lb (111.131 kg)  SpO2 94%  Physical Exam  Nursing note and vitals  reviewed. Constitutional: He appears well-developed and well-nourished. No distress.  HENT:  Head: Normocephalic and atraumatic.  Eyes: Conjunctivae are normal. Right eye exhibits no discharge. Left eye exhibits no discharge.  Neck: Neck supple.  Cardiovascular: Normal rate, regular rhythm and normal heart sounds.  Exam reveals no gallop and no friction rub.   No murmur heard. Pulmonary/Chest: Effort normal and breath sounds normal. No respiratory distress.  Abdominal: Soft. There is tenderness.       Abdomen is grossly normal to inspection. Patient has tenderness in the right lower quadrant. There is no rebound tenderness no guarding. No distention. No surgical scars noted.  Genitourinary:       Normal external male genitalia. No hernia appreciated. No scrotal swelling. No testicular tenderness. No inguinal adenopathy. No penile discharge. There is no costovertebral angle tenderness.  Musculoskeletal: He exhibits no edema and no tenderness.  Neurological: He is alert.  Skin: Skin is warm and dry. He is not diaphoretic.  Psychiatric: He has a normal mood and affect. His behavior is normal. Thought content normal.    ED Course  Procedures (including critical care time)  Labs Reviewed  CBC - Abnormal; Notable for the following:    Platelets 137 (*)    All other components within normal limits  BASIC METABOLIC PANEL - Abnormal; Notable for  the following:    Sodium 134 (*)    Glucose, Bld 207 (*)    All other components within normal limits   Ct Abdomen Pelvis W Contrast  06/03/2011  *RADIOLOGY REPORT*  Clinical Data: Right lower quadrant abdominal pain.  Leukocytosis.  CT ABDOMEN AND PELVIS WITH CONTRAST  Technique:  Multidetector CT imaging of the abdomen and pelvis was performed following the standard protocol during bolus administration of intravenous contrast.  Contrast: OMNIPAQUE IOHEXOL 300 MG/ML IJ SOLN  Comparison: CT of the chest performed 08/03/2010, and CT of the abdomen  and pelvis performed 04/01/2009  Findings: The 6 mm nodule at the right lung base is stable from the prior CT of the chest; a couple smaller left pulmonary nodules are also stable in size.  Minimal bibasilar atelectasis is noted.  There is diffuse fatty infiltration within the liver, with mild sparing about the gallbladder fossa.  The spleen is unremarkable in appearance.  The gallbladder is within normal limits.  The pancreas and adrenal glands are unremarkable in appearance.  A few small nonobstructing renal stones are noted within the left kidney, measuring up to 3 mm in size.  There is also a 2.3 cm cyst noted near the upper pole of the left kidney, and a 1.2 cm cyst noted at the lower pole of the left kidney.  Minimal calcification is suggested along the larger cyst.  No suspicious renal masses are identified.  The right kidney is unremarkable in appearance.  Mild nonspecific perinephric stranding is noted bilaterally.  No free fluid is identified.  The small bowel is unremarkable in appearance.  The stomach is within normal limits.  No acute vascular abnormalities are seen.  Minimal calcification is noted along the abdominal aorta.  Calcification is also seen along the distal common iliac arteries bilaterally.  A small umbilical hernia is noted, containing only fat, with minimal associated soft tissue stranding.  The appendix is normal in caliber, without evidence for appendicitis.  Contrast extends to the level of the ileocecal junction.  Minimal diverticulosis is noted at the hepatic flexure of the colon.  More diffuse diverticulosis is seen along the descending colon and proximal sigmoid colon.  The colon is otherwise unremarkable in appearance.  The bladder is mildly distended and grossly unremarkable in appearance.  The prostate is normal in size.  No inguinal lymphadenopathy is seen.  No acute osseous abnormalities are identified.  Vacuum phenomenon is noted at L5-S1.  IMPRESSION:  1.  No acute  abnormalities seen within the abdomen or pelvis. 2.  Scattered small left renal stones, measuring up to 3 mm in size. 3.  Small left renal cyst noted; minimal associated calcification suggested, without suspicious renal mass. 4.  Small umbilical hernia noted, containing only fat, with minimal associated soft tissue inflammation. 5.  Diverticulosis along the descending colon and proximal sigmoid colon; minimal diverticulosis at the hepatic flexure of the colon. 6.  Diffuse fatty infiltration within the liver. 7.  Bibasilar pulmonary nodules are stable in appearance, measuring 6 mm at the right lung base, unchanged from the prior CT of the chest. 8.  Calcification along the distal common iliac arteries bilaterally, and minimal calcification along the abdominal aorta.  Original Report Authenticated By: Tonia Ghent, M.D.     1. Abdominal pain        MDM  56 year old male with abdominal pain. Right lower quadrant tenderness on exam. Etiology is not completely clear. There is no hernia appreciated on exam. CT scan of  the abdomen and pelvis did not show any acute abnormality which may explain patient's symptoms. Patient reports complete relief after a dose of morphine. Repeat abdominal examination still does have some mild tenderness though. Feel patient can probably be discharged at this point in time though. Strict return precautions were discussed. Understands the need to followup with his family doctor. As needed pain medication.       Raeford Razor, MD 06/03/11 [phone]

## 2011-06-02 NOTE — ED Notes (Signed)
Pt back from CT

## 2011-06-03 MED ORDER — OXYCODONE-ACETAMINOPHEN 5-325 MG PO TABS: 1.0000 | ORAL_TABLET | ORAL | Status: AC | PRN

## 2011-06-03 NOTE — Discharge Instructions (Signed)
Abdominal Pain Abdominal pain can be caused by many things. Your caregiver decides the seriousness of your pain by an examination and possibly blood tests and X-rays. Many cases can be observed and treated at home. Most abdominal pain is not caused by a disease and will probably improve without treatment. However, in many cases, more time must pass before a clear cause of the pain can be found. Before that point, it may not be known if you need more testing, or if hospitalization or surgery is needed. HOME CARE INSTRUCTIONS   Do not take laxatives unless directed by your caregiver.   Take pain medicine only as directed by your caregiver.   Only take over-the-counter or prescription medicines for pain, discomfort, or fever as directed by your caregiver.   Try a clear liquid diet (broth, tea, or water) for as long as directed by your caregiver. Slowly move to a bland diet as tolerated.  SEEK IMMEDIATE MEDICAL CARE IF:   The pain does not go away.   You have a fever.   You keep throwing up (vomiting).   The pain is felt only in portions of the abdomen. Pain in the right side could possibly be appendicitis. In an adult, pain in the left lower portion of the abdomen could be colitis or diverticulitis.   You pass bloody or black tarry stools.  MAKE SURE YOU:   Understand these instructions.   Will watch your condition.   Will get help right away if you are not doing well or get worse.  Document Released: 12/29/2004 Document Revised: 12/01/2010 Document Reviewed: 11/07/2007 [employer] Patient Information 2012 [employer].  RESOURCE GUIDE  Dental Problems  Patients with Medicaid: [facility]                     [facility] [address]                                           [address] Phone:  [phone]                                                  Phone:  [phone]  If unable to pay or uninsured, contact:  [facility] or [facility] to become qualified for the adult dental clinic.  Chronic Pain Problems Contact [facility]  [phone] Patients need to be referred by their primary care doctor.  Insufficient Money for Medicine Contact [employer]:  call "211" or [facility] 271-5999.  No Primary Care Doctor Call [facility]  832-8000 Other agencies that provide inexpensive medical care    [facility]  [phone]     Internal Medicine  [phone]    [facility]  [phone]    Women's Clinic  832-4777    [facility]  [phone]    [facility]  [phone]  Psychological Services [facility]  [phone] [facility]  [phone] [facility]   800 853-5163 (emergency services 641-4993)  Substance Abuse Resources [facility]  336-882-2125 [facility] 336-784-9470 [facility] 336-285-9073 [facility] 336-845-3988 Residential & Outpatient Substance Abuse Program  800-659-3381    Abuse/Neglect [place] Child Abuse Hotline (336) 641-3795 [place] Child Abuse Hotline 800-378-5315 (After Hours)  Emergency Shelter [facility] (336) 271-5985  M[facility] (336) 275-9566 [facility] (704) 372-4663  MRSA Hotline #:   832-7006    [place] Resources  [facility] [place]     [employer]                          [facility] [address] [place]                       [address]      [address]  [place]                                                [place]                            [place] Phone:  [phone]                                   Phone:  [phone]                 Phone:  [phone]  [facility] Phone:  [phone]  [place] Child Abuse Hotline (336) 342-1394 (336) 342-3537 (After  Hours)   

## 2011-07-21 DIAGNOSIS — T1500XA Foreign body in cornea, unspecified eye, initial encounter: Secondary | ICD-10-CM | POA: Diagnosis not present

## 2011-08-08 ENCOUNTER — Other Ambulatory Visit: Payer: Self-pay | Admitting: Internal Medicine

## 2011-08-08 DIAGNOSIS — M109 Gout, unspecified: Secondary | ICD-10-CM | POA: Diagnosis not present

## 2011-08-08 DIAGNOSIS — R911 Solitary pulmonary nodule: Secondary | ICD-10-CM

## 2011-08-08 DIAGNOSIS — R7301 Impaired fasting glucose: Secondary | ICD-10-CM | POA: Diagnosis not present

## 2011-08-08 DIAGNOSIS — Z1331 Encounter for screening for depression: Secondary | ICD-10-CM | POA: Diagnosis not present

## 2011-08-08 DIAGNOSIS — E785 Hyperlipidemia, unspecified: Secondary | ICD-10-CM | POA: Diagnosis not present

## 2011-08-08 DIAGNOSIS — Z0000 Encounter for general adult medical examination without abnormal findings: Secondary | ICD-10-CM | POA: Diagnosis not present

## 2011-08-10 ENCOUNTER — Ambulatory Visit
Admission: RE | Admit: 2011-08-10 | Discharge: 2011-08-10 | Disposition: A | Discharge status: Home / Self Care | Payer: Medicare Other | Source: Ambulatory Visit | Attending: Internal Medicine | Admitting: Internal Medicine

## 2011-08-10 DIAGNOSIS — Z1331 Encounter for screening for depression: Secondary | ICD-10-CM | POA: Diagnosis not present

## 2011-08-10 DIAGNOSIS — M109 Gout, unspecified: Secondary | ICD-10-CM | POA: Diagnosis not present

## 2011-08-10 DIAGNOSIS — Z0000 Encounter for general adult medical examination without abnormal findings: Secondary | ICD-10-CM | POA: Diagnosis not present

## 2011-08-10 DIAGNOSIS — R918 Other nonspecific abnormal finding of lung field: Secondary | ICD-10-CM | POA: Diagnosis not present

## 2011-08-10 DIAGNOSIS — E119 Type 2 diabetes mellitus without complications: Secondary | ICD-10-CM | POA: Diagnosis not present

## 2011-08-10 DIAGNOSIS — R7301 Impaired fasting glucose: Secondary | ICD-10-CM | POA: Diagnosis not present

## 2011-08-10 DIAGNOSIS — R911 Solitary pulmonary nodule: Secondary | ICD-10-CM | POA: Diagnosis not present

## 2011-08-10 DIAGNOSIS — E785 Hyperlipidemia, unspecified: Secondary | ICD-10-CM | POA: Diagnosis not present

## 2011-08-12 DIAGNOSIS — D510 Vitamin B12 deficiency anemia due to intrinsic factor deficiency: Secondary | ICD-10-CM | POA: Diagnosis not present

## 2011-08-12 DIAGNOSIS — E119 Type 2 diabetes mellitus without complications: Secondary | ICD-10-CM | POA: Diagnosis not present

## 2011-08-30 ENCOUNTER — Encounter (HOSPITAL_COMMUNITY): Payer: Self-pay | Admitting: Emergency Medicine

## 2011-08-30 ENCOUNTER — Emergency Department (HOSPITAL_COMMUNITY)
Admission: EM | Admit: 2011-08-30 | Discharge: 2011-08-31 | Disposition: A | Discharge status: Home / Self Care | Payer: Medicare Other | Attending: Emergency Medicine | Admitting: Emergency Medicine

## 2011-08-30 DIAGNOSIS — Z862 Personal history of diseases of the blood and blood-forming organs and certain disorders involving the immune mechanism: Secondary | ICD-10-CM | POA: Diagnosis not present

## 2011-08-30 DIAGNOSIS — R109 Unspecified abdominal pain: Secondary | ICD-10-CM | POA: Insufficient documentation

## 2011-08-30 DIAGNOSIS — Z8639 Personal history of other endocrine, nutritional and metabolic disease: Secondary | ICD-10-CM | POA: Insufficient documentation

## 2011-08-30 DIAGNOSIS — Z79899 Other long term (current) drug therapy: Secondary | ICD-10-CM | POA: Diagnosis not present

## 2011-08-30 DIAGNOSIS — N201 Calculus of ureter: Secondary | ICD-10-CM | POA: Diagnosis not present

## 2011-08-30 DIAGNOSIS — E7800 Pure hypercholesterolemia, unspecified: Secondary | ICD-10-CM | POA: Diagnosis not present

## 2011-08-30 DIAGNOSIS — N200 Calculus of kidney: Secondary | ICD-10-CM | POA: Diagnosis not present

## 2011-08-30 DIAGNOSIS — E119 Type 2 diabetes mellitus without complications: Secondary | ICD-10-CM | POA: Insufficient documentation

## 2011-08-30 LAB — URINALYSIS, ROUTINE W REFLEX MICROSCOPIC
Glucose, UA: NEGATIVE mg/dL
Ketones, ur: 15 mg/dL — AB
Leukocytes, UA: NEGATIVE
Nitrite: NEGATIVE
Specific Gravity, Urine: 1.027 (ref 1.005–1.030)
pH: 5 (ref 5.0–8.0)

## 2011-08-30 LAB — URINE MICROSCOPIC-ADD ON

## 2011-08-30 NOTE — ED Notes (Signed)
Patient with pain in left flank and upper back.  Patient states he has had kidney stones before.  Patient states he is having some nausea and vomiting.  This has been going on for last hour.

## 2011-08-31 ENCOUNTER — Emergency Department (HOSPITAL_COMMUNITY): Payer: Medicare Other

## 2011-08-31 ENCOUNTER — Encounter (HOSPITAL_COMMUNITY): Payer: Self-pay | Admitting: Radiology

## 2011-08-31 DIAGNOSIS — N201 Calculus of ureter: Secondary | ICD-10-CM | POA: Diagnosis not present

## 2011-08-31 DIAGNOSIS — N200 Calculus of kidney: Secondary | ICD-10-CM | POA: Diagnosis not present

## 2011-08-31 DIAGNOSIS — R109 Unspecified abdominal pain: Secondary | ICD-10-CM | POA: Diagnosis not present

## 2011-08-31 MED ORDER — TAMSULOSIN HCL 0.4 MG PO CAPS
0.4000 mg | ORAL_CAPSULE | Freq: Once | ORAL | Status: AC
  Administered 2011-08-31: 0.4 mg via ORAL
  Filled 2011-08-31: qty 1

## 2011-08-31 MED ORDER — HYDROCODONE-ACETAMINOPHEN 5-325 MG PO TABS: 1.0000 | ORAL_TABLET | Freq: Four times a day (QID) | ORAL | Status: AC | PRN

## 2011-08-31 MED ORDER — PROMETHAZINE HCL 25 MG PO TABS: 25.0000 mg | ORAL_TABLET | Freq: Four times a day (QID) | ORAL | Status: DC | PRN

## 2011-08-31 MED ORDER — HYDROCODONE-ACETAMINOPHEN 5-325 MG PO TABS
1.0000 | ORAL_TABLET | Freq: Once | ORAL | Status: AC
  Administered 2011-08-31: 1 via ORAL
  Filled 2011-08-31: qty 1

## 2011-08-31 MED ORDER — TAMSULOSIN HCL 0.4 MG PO CAPS: 0.4000 mg | ORAL_CAPSULE | Freq: Every day | ORAL | Status: DC

## 2011-08-31 MED ORDER — KETOROLAC TROMETHAMINE 30 MG/ML IJ SOLN
30.0000 mg | Freq: Once | INTRAMUSCULAR | Status: AC
  Administered 2011-08-31: 30 mg via INTRAMUSCULAR
  Filled 2011-08-31: qty 1

## 2011-08-31 NOTE — ED Notes (Signed)
Family at bedside. 

## 2011-08-31 NOTE — ED Notes (Signed)
Patient ambulatory to CT at this time.

## 2011-08-31 NOTE — ED Notes (Signed)
Patient states that it hurts for him to sit down too long.  Noted patient ambulating around triage waiting area without difficulties prior to obtaining VS.

## 2011-08-31 NOTE — ED Provider Notes (Signed)
Medical screening examination/treatment/procedure(s) were performed by non-physician practitioner and as supervising physician I was immediately available for consultation/collaboration.    Ayiana Winslett D Kanyon Seibold, MD 08/31/11 0641 

## 2011-08-31 NOTE — Discharge Instructions (Signed)
Kidney Stones Kidney stones (ureteral lithiasis) are solid masses that form inside your kidneys. The intense pain is caused by the stone moving through the kidney, ureter, bladder, and urethra (urinary tract). When the stone moves, the ureter starts to spasm around the stone. The stone is usually passed in the urine.  HOME CARE  Drink enough fluids to keep your pee (urine) clear or pale yellow. This helps to get the stone out.   Strain all pee through the provided strainer. Do not pee without peeing through the strainer, not even once. If you pee the stone out, catch it. The stone may be as small as a grain of salt. Take this to your doctor.   Only take medicine as told by your doctor.   Follow up with your doctor as told.   Get follow-up X-rays as told by your doctor.  GET HELP RIGHT AWAY IF:   Your pain does not get better with medicine.   You have a fever.   Your pain increases and gets worse over 18 hours.   You have new belly (abdominal) pain.   You feel faint or pass out.  MAKE SURE YOU:   Understand these instructions.   Will watch your condition.   Will get help right away if you are not doing well or get worse.  Document Released: 09/07/2007 Document Revised: 03/10/2011 Document Reviewed: 01/16/2009 [facility] Patient Information 2012 [place], [place]. As discussed, you have 2 small, kidney stones, one within the ureter.  That is in the process of passing the other within the kidney itself, U. been given medication for pain, as well as Flomax to help dilate the ureter to hopefully help pass the stone was easily.  You have also been given a referral to [facility] for followup

## 2011-08-31 NOTE — ED Provider Notes (Signed)
History     CSN: [id number]  Arrival date & time 08/30/11  2147   None     Chief Complaint  Patient presents with  . Flank Pain    (Consider location/radiation/quality/duration/timing/severity/associated sxs/prior treatment) HPI Comments: Patient with a history of kidney stands now has developed left flank pain, radiating to the left lower abdomen, consistent with his previous symptoms of kidney stone.  He was able to pass the stone in the past.  His pain is waxing and waning at this point, it is minimal  Patient is a 56 y.o. male presenting with flank pain. The history is provided by the patient.  Flank Pain This is a recurrent problem. The current episode started today. The problem occurs constantly. The problem has been waxing and waning. Pertinent negatives include no fever, rash or urinary symptoms.    Past Medical History  Diagnosis Date  . Renal disorder   . Gout   . High cholesterol   . Diabetes mellitus     Past Surgical History  Procedure Date  . Joint replacement     No family history on file.  History  Substance Use Topics  . Smoking status: Never Smoker   . Smokeless tobacco: Not on file  . Alcohol Use: No      Review of Systems  Constitutional: Negative for fever.  Genitourinary: Positive for flank pain. Negative for dysuria, frequency, hematuria and decreased urine volume.  Skin: Negative for rash.  Neurological: Negative for dizziness.    Allergies  Dilaudid and Penciclovir  Home Medications   Current Outpatient Rx  Name Route Sig Dispense Refill  . ALLOPURINOL 300 MG PO TABS Oral Take 300 mg by mouth daily.    Marland Kitchen CRANBERRY 1000 MG PO CAPS Oral Take 1,000 mg by mouth daily.    Marland Kitchen LORATADINE 10 MG PO TABS Oral Take 10 mg by mouth daily.    Marland Kitchen NAPROXEN SODIUM 220 MG PO TABS Oral Take 220 mg by mouth 2 (two) times daily with a meal.    . PRAVASTATIN SODIUM 20 MG PO TABS Oral Take 40 mg by mouth daily.     Marland Kitchen HYDROCODONE-ACETAMINOPHEN 5-325 MG  PO TABS Oral Take 1 tablet by mouth every 6 (six) hours as needed for pain. 30 tablet 0  . TAMSULOSIN HCL 0.4 MG PO CAPS Oral Take 1 capsule (0.4 mg total) by mouth daily. 30 capsule 0    BP 146/88  Pulse 63  Temp(Src) 97.7 F (36.5 C) (Oral)  Resp 20  SpO2 97%  Physical Exam  Constitutional: He is oriented to person, place, and time. He appears well-developed and well-nourished.  HENT:  Head: Normocephalic.  Eyes: Pupils are equal, round, and reactive to light.  Neck: Normal range of motion.  Cardiovascular: Normal rate.   Pulmonary/Chest: Effort normal.  Abdominal: Soft.  Musculoskeletal: Normal range of motion.  Neurological: He is alert and oriented to person, place, and time.  Skin: Skin is warm.    ED Course  Procedures (including critical care time)  Labs Reviewed  URINALYSIS, ROUTINE W REFLEX MICROSCOPIC - Abnormal; Notable for the following:    Color, Urine AMBER (*) BIOCHEMICALS MAY BE AFFECTED BY COLOR   Hgb urine dipstick MODERATE (*)    Bilirubin Urine SMALL (*)    Ketones, ur 15 (*)    Protein, ur 100 (*)    All other components within normal limits  URINE MICROSCOPIC-ADD ON - Abnormal; Notable for the following:    Casts HYALINE CASTS (*)  All other components within normal limits   Ct Abdomen Pelvis Wo Contrast  08/31/2011  *RADIOLOGY REPORT*  Clinical Data: Left flank pain.  Nausea and vomiting.  CT ABDOMEN AND PELVIS WITHOUT CONTRAST  Technique:  Multidetector CT imaging of the abdomen and pelvis was performed following the standard protocol without intravenous contrast.  Comparison: 06/02/2011  Findings: Respiratory motion artifact in the lung bases.  Scattered bilateral pulmonary nodules in the lung bases, largest measuring 6 mm diameter.  These appear stable since the previous study.  Small parenchymal cysts.  There is a 3 mm stone in the mid left ureter at about the level of L4-5 with mild proximal ureterectasis and pyelocaliectasis.  Mild periureteral  and pararenal stranding.  Changes consistent with moderate obstruction.  No additional ureteral stones are demonstrated.  No bladder stones.  There are several punctate intrarenal stones on the left without obstruction.  No right renal stones.  The unenhanced appearance of the liver, spleen, gallbladder, pancreas, adrenal glands, abdominal aorta, and retroperitoneal lymph nodes is unremarkable.  The stomach and small bowel are decompressed.  Stool filled colon with scattered diverticula.  No wall thickening or distension.  Small umbilical hernia containing fat.  No free air or free fluid in the abdomen.  Pelvis:  Prostate gland is not enlarged.  The bladder is decompressed without apparent wall thickening.  No inflammatory changes in the sigmoid colon.  The appendix is normal.  No free or loculated pelvic fluid collections.  Mild lumbar scoliosis and degenerative changes.  IMPRESSION: 3 mm stone in the mid left ureter with moderate proximal obstruction.  Nonobstructing intrarenal stones on the left.  Small umbilical hernia containing fat.  Original Report Authenticated By: Marlon Pel, M.D.     1. Kidney stone       MDM   Patient has kidney stone on the left, hematuria.  We'll treat with Flomax.  Hydrocodone, portal in the emergency department, and followup with urology        Arman Filter, NP 08/31/11 0305  Arman Filter, NP 08/31/11 0305  Arman Filter, NP 08/31/11 0306  Arman Filter, NP 08/31/11 0306  Arman Filter, NP 08/31/11 0306  Arman Filter, NP 08/31/11 [phone]

## 2011-08-31 NOTE — ED Notes (Signed)
Patient is resting comfortably. 

## 2011-09-02 DIAGNOSIS — H18419 Arcus senilis, unspecified eye: Secondary | ICD-10-CM | POA: Diagnosis not present

## 2011-09-02 DIAGNOSIS — H2510 Age-related nuclear cataract, unspecified eye: Secondary | ICD-10-CM | POA: Diagnosis not present

## 2011-09-02 DIAGNOSIS — E119 Type 2 diabetes mellitus without complications: Secondary | ICD-10-CM | POA: Diagnosis not present

## 2011-09-02 DIAGNOSIS — H04129 Dry eye syndrome of unspecified lacrimal gland: Secondary | ICD-10-CM | POA: Diagnosis not present

## 2011-09-13 DIAGNOSIS — N200 Calculus of kidney: Secondary | ICD-10-CM | POA: Diagnosis not present

## 2011-09-13 DIAGNOSIS — N201 Calculus of ureter: Secondary | ICD-10-CM | POA: Diagnosis not present

## 2011-09-16 DIAGNOSIS — M542 Cervicalgia: Secondary | ICD-10-CM | POA: Diagnosis not present

## 2011-09-22 DIAGNOSIS — M546 Pain in thoracic spine: Secondary | ICD-10-CM | POA: Diagnosis not present

## 2011-10-17 DIAGNOSIS — E119 Type 2 diabetes mellitus without complications: Secondary | ICD-10-CM | POA: Diagnosis not present

## 2011-11-16 DIAGNOSIS — E119 Type 2 diabetes mellitus without complications: Secondary | ICD-10-CM | POA: Diagnosis not present

## 2011-11-23 DIAGNOSIS — E119 Type 2 diabetes mellitus without complications: Secondary | ICD-10-CM | POA: Diagnosis not present

## 2011-11-23 DIAGNOSIS — E785 Hyperlipidemia, unspecified: Secondary | ICD-10-CM | POA: Diagnosis not present

## 2011-11-25 DIAGNOSIS — IMO0002 Reserved for concepts with insufficient information to code with codable children: Secondary | ICD-10-CM | POA: Diagnosis not present

## 2011-12-30 DIAGNOSIS — Z23 Encounter for immunization: Secondary | ICD-10-CM | POA: Diagnosis not present

## 2012-03-07 DIAGNOSIS — H04129 Dry eye syndrome of unspecified lacrimal gland: Secondary | ICD-10-CM | POA: Diagnosis not present

## 2012-03-07 DIAGNOSIS — H01009 Unspecified blepharitis unspecified eye, unspecified eyelid: Secondary | ICD-10-CM | POA: Diagnosis not present

## 2012-03-07 DIAGNOSIS — E119 Type 2 diabetes mellitus without complications: Secondary | ICD-10-CM | POA: Diagnosis not present

## 2012-03-07 DIAGNOSIS — H0019 Chalazion unspecified eye, unspecified eyelid: Secondary | ICD-10-CM | POA: Diagnosis not present

## 2012-04-10 DIAGNOSIS — E119 Type 2 diabetes mellitus without complications: Secondary | ICD-10-CM | POA: Diagnosis not present

## 2012-04-13 DIAGNOSIS — J069 Acute upper respiratory infection, unspecified: Secondary | ICD-10-CM | POA: Diagnosis not present

## 2012-04-13 DIAGNOSIS — J45909 Unspecified asthma, uncomplicated: Secondary | ICD-10-CM | POA: Diagnosis not present

## 2012-05-24 DIAGNOSIS — R05 Cough: Secondary | ICD-10-CM | POA: Diagnosis not present

## 2012-06-03 ENCOUNTER — Encounter (HOSPITAL_COMMUNITY): Payer: Self-pay | Admitting: *Deleted

## 2012-06-03 ENCOUNTER — Emergency Department (HOSPITAL_COMMUNITY)
Admission: EM | Admit: 2012-06-03 | Discharge: 2012-06-03 | Disposition: A | Discharge status: Home / Self Care | Payer: Medicare Other | Attending: Emergency Medicine | Admitting: Emergency Medicine

## 2012-06-03 DIAGNOSIS — E860 Dehydration: Secondary | ICD-10-CM | POA: Insufficient documentation

## 2012-06-03 DIAGNOSIS — R1110 Vomiting, unspecified: Secondary | ICD-10-CM | POA: Diagnosis not present

## 2012-06-03 DIAGNOSIS — M109 Gout, unspecified: Secondary | ICD-10-CM | POA: Insufficient documentation

## 2012-06-03 DIAGNOSIS — Z87448 Personal history of other diseases of urinary system: Secondary | ICD-10-CM | POA: Insufficient documentation

## 2012-06-03 DIAGNOSIS — R197 Diarrhea, unspecified: Secondary | ICD-10-CM | POA: Insufficient documentation

## 2012-06-03 DIAGNOSIS — Z79899 Other long term (current) drug therapy: Secondary | ICD-10-CM | POA: Insufficient documentation

## 2012-06-03 DIAGNOSIS — R42 Dizziness and giddiness: Secondary | ICD-10-CM | POA: Diagnosis not present

## 2012-06-03 DIAGNOSIS — R1084 Generalized abdominal pain: Secondary | ICD-10-CM | POA: Diagnosis not present

## 2012-06-03 DIAGNOSIS — R112 Nausea with vomiting, unspecified: Secondary | ICD-10-CM | POA: Diagnosis not present

## 2012-06-03 DIAGNOSIS — E7800 Pure hypercholesterolemia, unspecified: Secondary | ICD-10-CM | POA: Diagnosis not present

## 2012-06-03 DIAGNOSIS — R5381 Other malaise: Secondary | ICD-10-CM | POA: Diagnosis not present

## 2012-06-03 DIAGNOSIS — E119 Type 2 diabetes mellitus without complications: Secondary | ICD-10-CM | POA: Insufficient documentation

## 2012-06-03 DIAGNOSIS — R109 Unspecified abdominal pain: Secondary | ICD-10-CM

## 2012-06-03 LAB — URINE MICROSCOPIC-ADD ON

## 2012-06-03 LAB — URINALYSIS, ROUTINE W REFLEX MICROSCOPIC
Glucose, UA: NEGATIVE mg/dL
pH: 7.5 (ref 5.0–8.0)

## 2012-06-03 LAB — CBC WITH DIFFERENTIAL/PLATELET
Basophils Absolute: 0 10*3/uL (ref 0.0–0.1)
Basophils Relative: 0 % (ref 0–1)
Hemoglobin: 17 g/dL (ref 13.0–17.0)
Lymphocytes Relative: 4 % — ABNORMAL LOW (ref 12–46)
MCHC: 34.3 g/dL (ref 30.0–36.0)
Monocytes Relative: 5 % (ref 3–12)
Neutro Abs: 13.9 10*3/uL — ABNORMAL HIGH (ref 1.7–7.7)
Neutrophils Relative %: 91 % — ABNORMAL HIGH (ref 43–77)
RBC: 5.21 MIL/uL (ref 4.22–5.81)
WBC: 15.3 10*3/uL — ABNORMAL HIGH (ref 4.0–10.5)

## 2012-06-03 LAB — COMPREHENSIVE METABOLIC PANEL
AST: 28 U/L (ref 0–37)
Albumin: 4.2 g/dL (ref 3.5–5.2)
Alkaline Phosphatase: 107 U/L (ref 39–117)
BUN: 20 mg/dL (ref 6–23)
Chloride: 96 mEq/L (ref 96–112)
Potassium: 4.7 mEq/L (ref 3.5–5.1)
Total Bilirubin: 0.8 mg/dL (ref 0.3–1.2)

## 2012-06-03 MED ORDER — ONDANSETRON 8 MG PO TBDP: ORAL_TABLET | ORAL | Status: DC

## 2012-06-03 MED ORDER — OXYCODONE-ACETAMINOPHEN 5-325 MG PO TABS: 2.0000 | ORAL_TABLET | ORAL | Status: DC | PRN

## 2012-06-03 MED ORDER — FENTANYL CITRATE 0.05 MG/ML IJ SOLN
100.0000 ug | Freq: Once | INTRAMUSCULAR | Status: AC
  Administered 2012-06-03: 100 ug via INTRAVENOUS
  Filled 2012-06-03: qty 2

## 2012-06-03 MED ORDER — SODIUM CHLORIDE 0.9 % IV SOLN
INTRAVENOUS | Status: DC
  Administered 2012-06-03: 14:00:00 via INTRAVENOUS

## 2012-06-03 MED ORDER — SODIUM CHLORIDE 0.9 % IV BOLUS (SEPSIS)
1000.0000 mL | Freq: Once | INTRAVENOUS | Status: AC
  Administered 2012-06-03: 1000 mL via INTRAVENOUS

## 2012-06-03 MED ORDER — ONDANSETRON HCL 4 MG/2ML IJ SOLN
4.0000 mg | Freq: Once | INTRAMUSCULAR | Status: AC
  Administered 2012-06-03: 4 mg via INTRAVENOUS
  Filled 2012-06-03: qty 2

## 2012-06-03 MED ORDER — LOPERAMIDE HCL 2 MG PO CAPS: ORAL_CAPSULE | ORAL | Status: DC

## 2012-06-03 MED ORDER — METOCLOPRAMIDE HCL 10 MG PO TABS: 10.0000 mg | ORAL_TABLET | Freq: Four times a day (QID) | ORAL | Status: DC | PRN

## 2012-06-03 NOTE — ED Provider Notes (Signed)
History     CSN: [id number]  Arrival date & time 06/03/12  1110   First MD Initiated Contact with Patient 06/03/12 1138      Chief Complaint  Patient presents with  . Diarrhea  . Emesis  . Nausea  . Weakness    (Consider location/radiation/quality/duration/timing/severity/associated sxs/prior treatment) HPI This 57 year old male has almost 12 hours of acute onset multiple spells of nonbloody vomiting and diarrhea with diffuse abdominal crampy pain without focal localized abdominal pain he has no fever rash confusion chest pain shortness breath or cough, he is no dysuria or testicular pain, he has no arthralgias or myalgias new for him today, he is no confusion but did feel lightheaded prior to arrival and feels dehydrated because he has not been able to eat or drink anything today from all the vomiting and diarrhea that he has been doing about a dozen episodes each prior to arrival with no treatment prior to arrival. Past Medical History  Diagnosis Date  . Renal disorder   . Gout   . High cholesterol   . Diabetes mellitus     Past Surgical History  Procedure Laterality Date  . Joint replacement      History reviewed. No pertinent family history.  History  Substance Use Topics  . Smoking status: Never Smoker   . Smokeless tobacco: Never Used  . Alcohol Use: No      Review of Systems 10 Systems reviewed and are negative for acute change except as noted in the HPI. Allergies  Dilaudid and Penicillins  Home Medications   Current Outpatient Rx  Name  Route  Sig  Dispense  Refill  . allopurinol (ZYLOPRIM) 300 MG tablet   Oral   Take 300 mg by mouth daily.         Marland Kitchen loratadine (CLARITIN) 10 MG tablet   Oral   Take 10 mg by mouth daily.         . naproxen sodium (ANAPROX) 220 MG tablet   Oral   Take 220 mg by mouth 2 (two) times daily with a meal.         . pravastatin (PRAVACHOL) 40 MG tablet   Oral   Take 40 mg by mouth daily.         Marland Kitchen loperamide  (IMODIUM) 2 MG capsule      Take two tabs po initially, then one tab after each loose stool: max 8 tabs in 24 hours   12 capsule   0   . metoCLOPramide (REGLAN) 10 MG tablet   Oral   Take 1 tablet (10 mg total) by mouth every 6 (six) hours as needed (nausea/headache).   6 tablet   0   . ondansetron (ZOFRAN ODT) 8 MG disintegrating tablet      8mg  ODT q4 hours prn nausea   4 tablet   0   . oxyCODONE-acetaminophen (PERCOCET) 5-325 MG per tablet   Oral   Take 2 tablets by mouth every 4 (four) hours as needed for pain.   6 tablet   0     BP 104/66  Pulse 90  Temp(Src) 98.4 F (36.9 C) (Oral)  Resp 18  SpO2 95%  Physical Exam  Nursing note and vitals reviewed. Constitutional:  Awake, alert, nontoxic appearance.  HENT:  Head: Atraumatic.  Eyes: Right eye exhibits no discharge. Left eye exhibits no discharge.  Neck: Neck supple.  Cardiovascular: Regular rhythm.   No murmur heard. tachycardic  Pulmonary/Chest: Breath sounds normal. No respiratory  distress. He has no wheezes. He has no rales. He exhibits no tenderness.  Abdominal: Soft. Bowel sounds are normal. He exhibits no distension and no mass. There is tenderness. There is no rebound and no guarding.  Mild diffuse tenderness without rebound or localizing tenderness  Genitourinary:  Scrotum is nontender  Musculoskeletal: He exhibits no edema and no tenderness.  Baseline ROM, no obvious new focal weakness.  Neurological:  Mental status and motor strength appears baseline for patient and situation.  Skin: No rash noted.  Psychiatric: He has a normal mood and affect.    ED Course  Procedures (including critical care time) Feels much better. Labs Reviewed  CBC WITH DIFFERENTIAL - Abnormal; Notable for the following:    WBC 15.3 (*)    Neutrophils Relative 91 (*)    Neutro Abs 13.9 (*)    Lymphocytes Relative 4 (*)    Lymphs Abs 0.6 (*)    All other components within normal limits  COMPREHENSIVE METABOLIC  PANEL - Abnormal; Notable for the following:    Glucose, Bld 148 (*)    GFR calc non Af Amer 79 (*)    All other components within normal limits  URINALYSIS, ROUTINE W REFLEX MICROSCOPIC - Abnormal; Notable for the following:    Color, Urine AMBER (*)    APPearance TURBID (*)    Bilirubin Urine SMALL (*)    Ketones, ur TRACE (*)    Protein, ur 100 (*)    Leukocytes, UA SMALL (*)    All other components within normal limits  URINE MICROSCOPIC-ADD ON - Abnormal; Notable for the following:    Bacteria, UA FEW (*)    All other components within normal limits  LIPASE, BLOOD   No results found.   1. Vomiting and diarrhea   2. Abdominal pain       MDM   Pt stable in ED with no significant deterioration in condition.  Patient / Family / Caregiver informed of clinical course, understand medical decision-making process, and agree with plan.  I doubt any other EMC precluding discharge at this time including, but not necessarily limited to the following:SBI, peritonitis.      Hurman Horn, MD 06/04/12 [phone]

## 2012-06-03 NOTE — ED Notes (Signed)
Pt states since 2:15 am he's been experiencing, n/v/d, states unable to keep down any food or fluids, states feels weak and having abdominal pain.

## 2012-08-16 DIAGNOSIS — M109 Gout, unspecified: Secondary | ICD-10-CM | POA: Diagnosis not present

## 2012-08-16 DIAGNOSIS — E1129 Type 2 diabetes mellitus with other diabetic kidney complication: Secondary | ICD-10-CM | POA: Diagnosis not present

## 2012-08-16 DIAGNOSIS — Z1331 Encounter for screening for depression: Secondary | ICD-10-CM | POA: Diagnosis not present

## 2012-08-16 DIAGNOSIS — N182 Chronic kidney disease, stage 2 (mild): Secondary | ICD-10-CM | POA: Diagnosis not present

## 2012-08-16 DIAGNOSIS — Z23 Encounter for immunization: Secondary | ICD-10-CM | POA: Diagnosis not present

## 2012-08-22 DIAGNOSIS — N182 Chronic kidney disease, stage 2 (mild): Secondary | ICD-10-CM | POA: Diagnosis not present

## 2012-08-31 ENCOUNTER — Other Ambulatory Visit: Payer: Self-pay | Admitting: Internal Medicine

## 2012-08-31 DIAGNOSIS — R809 Proteinuria, unspecified: Secondary | ICD-10-CM

## 2012-09-05 ENCOUNTER — Ambulatory Visit
Admission: RE | Admit: 2012-09-05 | Discharge: 2012-09-05 | Disposition: A | Discharge status: Home / Self Care | Payer: Medicare Other | Source: Ambulatory Visit | Attending: Internal Medicine | Admitting: Internal Medicine

## 2012-09-05 DIAGNOSIS — R809 Proteinuria, unspecified: Secondary | ICD-10-CM | POA: Diagnosis not present

## 2012-09-05 DIAGNOSIS — N281 Cyst of kidney, acquired: Secondary | ICD-10-CM | POA: Diagnosis not present

## 2012-09-05 DIAGNOSIS — N200 Calculus of kidney: Secondary | ICD-10-CM | POA: Diagnosis not present

## 2012-09-15 DIAGNOSIS — J209 Acute bronchitis, unspecified: Secondary | ICD-10-CM | POA: Diagnosis not present

## 2012-11-16 DIAGNOSIS — J019 Acute sinusitis, unspecified: Secondary | ICD-10-CM | POA: Diagnosis not present

## 2012-12-25 DIAGNOSIS — Z23 Encounter for immunization: Secondary | ICD-10-CM | POA: Diagnosis not present

## 2013-02-19 DIAGNOSIS — N182 Chronic kidney disease, stage 2 (mild): Secondary | ICD-10-CM | POA: Diagnosis not present

## 2013-02-19 DIAGNOSIS — E1129 Type 2 diabetes mellitus with other diabetic kidney complication: Secondary | ICD-10-CM | POA: Diagnosis not present

## 2013-03-05 DIAGNOSIS — M1710 Unilateral primary osteoarthritis, unspecified knee: Secondary | ICD-10-CM | POA: Diagnosis not present

## 2013-03-05 DIAGNOSIS — Z96659 Presence of unspecified artificial knee joint: Secondary | ICD-10-CM | POA: Diagnosis not present

## 2013-03-24 DIAGNOSIS — J040 Acute laryngitis: Secondary | ICD-10-CM | POA: Diagnosis not present

## 2013-03-27 DIAGNOSIS — B9789 Other viral agents as the cause of diseases classified elsewhere: Secondary | ICD-10-CM | POA: Diagnosis not present

## 2013-03-27 DIAGNOSIS — J9801 Acute bronchospasm: Secondary | ICD-10-CM | POA: Diagnosis not present

## 2013-03-27 DIAGNOSIS — I9589 Other hypotension: Secondary | ICD-10-CM | POA: Diagnosis not present

## 2013-03-27 DIAGNOSIS — J209 Acute bronchitis, unspecified: Secondary | ICD-10-CM | POA: Diagnosis not present

## 2013-04-10 DIAGNOSIS — M1710 Unilateral primary osteoarthritis, unspecified knee: Secondary | ICD-10-CM | POA: Diagnosis not present

## 2013-04-12 DIAGNOSIS — R809 Proteinuria, unspecified: Secondary | ICD-10-CM | POA: Diagnosis not present

## 2013-04-12 DIAGNOSIS — J069 Acute upper respiratory infection, unspecified: Secondary | ICD-10-CM | POA: Diagnosis not present

## 2013-04-17 DIAGNOSIS — M1710 Unilateral primary osteoarthritis, unspecified knee: Secondary | ICD-10-CM | POA: Diagnosis not present

## 2013-04-24 DIAGNOSIS — IMO0002 Reserved for concepts with insufficient information to code with codable children: Secondary | ICD-10-CM | POA: Diagnosis not present

## 2013-04-24 DIAGNOSIS — M1710 Unilateral primary osteoarthritis, unspecified knee: Secondary | ICD-10-CM | POA: Diagnosis not present

## 2013-08-09 ENCOUNTER — Other Ambulatory Visit: Payer: Self-pay | Admitting: Adult Health

## 2013-08-09 DIAGNOSIS — R109 Unspecified abdominal pain: Secondary | ICD-10-CM | POA: Diagnosis not present

## 2013-08-09 DIAGNOSIS — D49519 Neoplasm of unspecified behavior of unspecified kidney: Secondary | ICD-10-CM

## 2013-08-09 DIAGNOSIS — N201 Calculus of ureter: Secondary | ICD-10-CM | POA: Diagnosis not present

## 2013-08-09 DIAGNOSIS — R3129 Other microscopic hematuria: Secondary | ICD-10-CM | POA: Diagnosis not present

## 2013-08-09 DIAGNOSIS — N200 Calculus of kidney: Secondary | ICD-10-CM | POA: Diagnosis not present

## 2013-08-20 ENCOUNTER — Ambulatory Visit (HOSPITAL_COMMUNITY)
Admission: RE | Admit: 2013-08-20 | Discharge: 2013-08-20 | Disposition: A | Discharge status: Home / Self Care | Payer: Medicare Other | Source: Ambulatory Visit | Attending: Adult Health | Admitting: Adult Health

## 2013-08-20 DIAGNOSIS — D49519 Neoplasm of unspecified behavior of unspecified kidney: Secondary | ICD-10-CM

## 2013-08-20 DIAGNOSIS — N289 Disorder of kidney and ureter, unspecified: Secondary | ICD-10-CM | POA: Diagnosis not present

## 2013-08-20 DIAGNOSIS — N281 Cyst of kidney, acquired: Secondary | ICD-10-CM | POA: Diagnosis not present

## 2013-08-20 MED ORDER — GADOBENATE DIMEGLUMINE 529 MG/ML IV SOLN
20.0000 mL | Freq: Once | INTRAVENOUS | Status: AC | PRN
  Administered 2013-08-20: 20 mL via INTRAVENOUS

## 2013-08-22 DIAGNOSIS — N182 Chronic kidney disease, stage 2 (mild): Secondary | ICD-10-CM | POA: Diagnosis not present

## 2013-08-22 DIAGNOSIS — M109 Gout, unspecified: Secondary | ICD-10-CM | POA: Diagnosis not present

## 2013-08-22 DIAGNOSIS — Z125 Encounter for screening for malignant neoplasm of prostate: Secondary | ICD-10-CM | POA: Diagnosis not present

## 2013-08-22 DIAGNOSIS — Z0000 Encounter for general adult medical examination without abnormal findings: Secondary | ICD-10-CM | POA: Diagnosis not present

## 2013-08-22 DIAGNOSIS — Z1331 Encounter for screening for depression: Secondary | ICD-10-CM | POA: Diagnosis not present

## 2013-08-22 DIAGNOSIS — E119 Type 2 diabetes mellitus without complications: Secondary | ICD-10-CM | POA: Diagnosis not present

## 2013-08-22 DIAGNOSIS — D510 Vitamin B12 deficiency anemia due to intrinsic factor deficiency: Secondary | ICD-10-CM | POA: Diagnosis not present

## 2013-08-29 ENCOUNTER — Other Ambulatory Visit: Payer: Self-pay | Admitting: Urology

## 2013-08-29 DIAGNOSIS — D4959 Neoplasm of unspecified behavior of other genitourinary organ: Secondary | ICD-10-CM | POA: Diagnosis not present

## 2013-08-29 DIAGNOSIS — N200 Calculus of kidney: Secondary | ICD-10-CM | POA: Diagnosis not present

## 2013-08-29 DIAGNOSIS — Q619 Cystic kidney disease, unspecified: Secondary | ICD-10-CM | POA: Diagnosis not present

## 2013-08-29 DIAGNOSIS — N201 Calculus of ureter: Secondary | ICD-10-CM | POA: Diagnosis not present

## 2013-08-30 ENCOUNTER — Encounter (HOSPITAL_COMMUNITY): Payer: Self-pay | Admitting: Pharmacy Technician

## 2013-09-10 ENCOUNTER — Encounter (HOSPITAL_COMMUNITY): Payer: Self-pay

## 2013-09-12 ENCOUNTER — Encounter (HOSPITAL_COMMUNITY)
Admission: RE | Disposition: A | Discharge status: Home / Self Care | Payer: Self-pay | Source: Ambulatory Visit | Attending: Urology

## 2013-09-12 ENCOUNTER — Ambulatory Visit (HOSPITAL_COMMUNITY)
Admission: RE | Admit: 2013-09-12 | Discharge: 2013-09-12 | Disposition: A | Discharge status: Home / Self Care | Payer: Medicare Other | Source: Ambulatory Visit | Attending: Urology | Admitting: Urology

## 2013-09-12 ENCOUNTER — Ambulatory Visit (HOSPITAL_COMMUNITY): Payer: Medicare Other

## 2013-09-12 ENCOUNTER — Encounter (HOSPITAL_COMMUNITY): Payer: Self-pay

## 2013-09-12 DIAGNOSIS — Z87891 Personal history of nicotine dependence: Secondary | ICD-10-CM | POA: Insufficient documentation

## 2013-09-12 DIAGNOSIS — Z79899 Other long term (current) drug therapy: Secondary | ICD-10-CM | POA: Diagnosis not present

## 2013-09-12 DIAGNOSIS — Z96659 Presence of unspecified artificial knee joint: Secondary | ICD-10-CM | POA: Diagnosis not present

## 2013-09-12 DIAGNOSIS — Z7982 Long term (current) use of aspirin: Secondary | ICD-10-CM | POA: Diagnosis not present

## 2013-09-12 DIAGNOSIS — N201 Calculus of ureter: Secondary | ICD-10-CM

## 2013-09-12 DIAGNOSIS — M109 Gout, unspecified: Secondary | ICD-10-CM | POA: Diagnosis not present

## 2013-09-12 DIAGNOSIS — Z01818 Encounter for other preprocedural examination: Secondary | ICD-10-CM | POA: Diagnosis not present

## 2013-09-12 DIAGNOSIS — N200 Calculus of kidney: Secondary | ICD-10-CM | POA: Diagnosis not present

## 2013-09-12 DIAGNOSIS — Q619 Cystic kidney disease, unspecified: Secondary | ICD-10-CM | POA: Diagnosis not present

## 2013-09-12 LAB — GLUCOSE, CAPILLARY: Glucose-Capillary: 117 mg/dL — ABNORMAL HIGH (ref 70–99)

## 2013-09-12 SURGERY — LITHOTRIPSY, ESWL
Anesthesia: LOCAL | Laterality: Left

## 2013-09-12 MED ORDER — DIAZEPAM 5 MG PO TABS
10.0000 mg | ORAL_TABLET | ORAL | Status: AC
  Administered 2013-09-12: 10 mg via ORAL
  Filled 2013-09-12: qty 2

## 2013-09-12 MED ORDER — TAMSULOSIN HCL 0.4 MG PO CAPS: 0.4000 mg | ORAL_CAPSULE | ORAL | Status: DC

## 2013-09-12 MED ORDER — OXYCODONE-ACETAMINOPHEN 10-325 MG PO TABS: 1.0000 | ORAL_TABLET | ORAL | Status: DC | PRN

## 2013-09-12 MED ORDER — SODIUM CHLORIDE 0.9 % IV SOLN
INTRAVENOUS | Status: DC
  Administered 2013-09-12: 10:00:00 via INTRAVENOUS

## 2013-09-12 MED ORDER — CIPROFLOXACIN HCL 500 MG PO TABS
500.0000 mg | ORAL_TABLET | ORAL | Status: AC
  Administered 2013-09-12: 500 mg via ORAL
  Filled 2013-09-12: qty 1

## 2013-09-12 MED ORDER — DIPHENHYDRAMINE HCL 25 MG PO CAPS
25.0000 mg | ORAL_CAPSULE | ORAL | Status: AC
  Administered 2013-09-12: 25 mg via ORAL
  Filled 2013-09-12: qty 1

## 2013-09-12 NOTE — Op Note (Signed)
See [facility] OP note scanned into chart. 

## 2013-09-12 NOTE — Discharge Instructions (Signed)
Lithotripsy, Care After °Refer to this sheet in the next few weeks. These instructions provide you with information on caring for yourself after your procedure. Your health care provider may also give you more specific instructions. Your treatment has been planned according to current medical practices, but problems sometimes occur. Call your health care provider if you have any problems or questions after your procedure. °WHAT TO EXPECT AFTER THE PROCEDURE  °· Your urine may have a red tinge for a few days after treatment. Blood loss is usually minimal. °· You may have soreness in the back or flank area. This usually goes away after a few days. The procedure can cause blotches or bruises on the back where the pressure wave enters the skin. These marks usually cause only minimal discomfort and should disappear in a short time. °· Stone fragments should begin to pass within 24 hours of treatment. However, a delayed passage is not unusual. °· You may have pain, discomfort, and feel sick to your stomach (nauseated) when the crushed fragments of stone are passed down the tube from the kidney to the bladder. Stone fragments can pass soon after the procedure and may last for up to 4 8 weeks. °· A small number of patients may have severe pain when stone fragments are not able to pass, which leads to an obstruction. °· If your stone is greater than 1 inch (2.5 cm) in diameter or if you have multiple stones that have a combined diameter greater than 1 inch (2.5 cm), you may require more than one treatment. °· If you had a stent placed prior to your procedure, you may experience some discomfort, especially during urination. You may experience the pain or discomfort in your flank or back, or you may experience a sharp pain or discomfort at the base of your penis or in your lower abdomen. The discomfort usually lasts only a few minutes after urinating. °HOME CARE INSTRUCTIONS  °· Rest at home until you feel your energy  improving. °· Only take over-the-counter or prescription medicines for pain, discomfort, or fever as directed by your health care provider. Depending on the type of lithotripsy, you may need to take antibiotics and anti-inflammatory medicines for a few days. °· Drink enough water and fluids to keep your urine clear or pale yellow. This helps "flush" your kidneys. It helps pass any remaining pieces of stone and prevents stones from coming back. °· Most people can resume daily activities within 1 2 days after standard lithotripsy. It can take longer to recover from laser and percutaneous lithotripsy. °· If the stones are in your urinary system, you may be asked to strain your urine at home to look for stones. Any stones that are found can be sent to a medical lab for examination. °· Visit your health care provider for a follow-up appointment in a few weeks. Your doctor may remove your stent if you have one. Your health care provider will also check to see whether stone particles still remain. °SEEK MEDICAL CARE IF:  °· Your pain is not relieved by medicine. °· You have a lasting nauseous feeling. °· You feel there is too much blood in the urine. °· You develop persistent problems with frequent or painful urination that does not at least partially improve after 2 days following the procedure. °· You have a congested cough. °· You feel lightheaded. °· You develop a rash or any other signs that might suggest an allergic problem. °· You develop any reaction or side   effects to your medicine(s). °SEEK IMMEDIATE MEDICAL CARE IF:  °· You experience severe back or flank pain or both. °· You see nothing but blood when you urinate. °· You cannot pass any urine at all. °· You have a fever or shaking chills. °· You develop shortness of breath, difficulty breathing, or chest pain. °· You develop vomiting that will not stop after 6 8 hours. °· You have a fainting episode. °Document Released: 04/10/2007 Document Revised: 01/09/2013  Document Reviewed: 10/04/2012 °[employer]® Patient Information ©2014 [employer]. ° °

## 2013-09-12 NOTE — H&P (Signed)
History of Present Illness Nephrolithiasis: He passed a 3 mm left ureteral stone in 5/13. CT scan 08/09/13 revealed a single 1 mm stone in the right kidney, a 3 mm left upper pole stone as well as a ureteral stone.  Stone analysis 2013: calcium oxalate.    Interval HX: Developed left flank pain and a CT scan on 08/09/13 revealed a 3 mm wide stone in the left proximal ureter. In addition what appeared to be a possible hyperdense cyst measuring 13 mm was noted in the left kidney. He was placed on medical expulsive therapy. He has had some mild discomfort in the left flank but no severe pain. He has not seen a stone pass.     Past Medical History Problems  1. History of Arthritis (V13.4) 2. History of Gout (274.9) 3. History of diabetes mellitus (V12.29) 4. History of hypertension (V12.59)  Surgical History Problems  1. History of Knee Replacement  Current Meds 1. Allopurinol 300 MG Oral Tablet;  Therapy: [id number] to Recorded 2. Aspirin 81 MG Oral Tablet;  Therapy: (Recorded:11Jun2013) to Recorded 3. Cyanocobalamin 1000 MCG/ML Injection Solution;  Therapy: [id number] to Recorded 4. Hydrocodone-Acetaminophen 7.5-325 MG Oral Tablet; TAKE 1 TO 2 TABLETS EVERY 4  TO 6 HOURS AS NEEDED FOR PAIN;  Therapy: 22LNL8921 to (Evaluate:12May2015); Last [id number] Ordered 5. Loratadine TABS;  Therapy: (Recorded:11Jun2013) to Recorded 6. Naproxen TABS;  Therapy: (Recorded:11Jun2013) to Recorded 7. Ondansetron HCl - 8 MG Oral Tablet; TAKE 1 TABLET Every 6 hours PRN;  Therapy: [id number] to (Last [id number])  Requested for: [id number] Ordered 8. Tamsulosin HCl - 0.4 MG Oral Capsule; TAKE 1 CAPSULE Daily;  Therapy: 86VEH2094 to (Evaluate:07Jun2015)  Requested for: 70JGG8366; Last  [id number] Ordered  Allergies Medication  1. Dilaudid TABS 2. Penicillins  Family History Problems  1. Family history of Death In The Family Father 2. Family history of Family Health Status - Mother's Age    age 57 3. Family history of Family Health Status Number Of Children   1 son 1 daugter 4. Family history of Nephrolithiasis : Sister  Social History Problems  1. Denied: History of Alcohol Use 2. Denied: History of Caffeine Use 3. Former smoker [place])   smoked 1ppd for 79yrs quit 44yrs ago 4. Marital History - Currently Married  Review of Systems Genitourinary, constitutional, skin, eye, otolaryngeal, hematologic/lymphatic, cardiovascular, pulmonary, endocrine, musculoskeletal, gastrointestinal, neurological and psychiatric system(s) were reviewed and pertinent findings if present are noted.  Genitourinary: erectile dysfunction.  Musculoskeletal: joint pain.    Vitals Vital Signs  Weight: 250 lb  Blood Pressure: 111 / 74 Temperature: 96.5 F Heart Rate: 74  Physical Exam Constitutional: Well nourished and well developed . No acute distress. The patient appears well hydrated.  ENT:. The ears and nose are normal in appearance.  Neck: The appearance of the neck is normal.  Pulmonary: No respiratory distress.  Cardiovascular: Heart rate and rhythm are normal.  Abdomen: The abdomen is obese. The abdomen is soft and nontender. No suprapubic tenderness. No CVA tenderness.  Skin: Normal skin turgor and normal skin color and pigmentation.  Neuro/Psych:. Mood and affect are appropriate.   The following images/tracing/specimen were independently visualized:  KUB: Stone in his mid left ureter appears unchanged in position.  The following clinical lab reports were reviewed:  UA again had red cells noted.  The following radiology reports were reviewed: MRI as below.    Assessment Assessed  1. Renal calculus, bilateral (592.0) 2. Calculus of left ureter (592.1) 3. Renal cyst, left (753.10)  His MRI revealed a lesion in the left kidney appears to be a Bosniak 2 cyst.    We discussed the management of urinary stones. These options include observation, ureteroscopy, shockwave  lithotripsy, and PCNL. We discussed which options are relevant to these particular stones. We discussed the natural history of stones as well as the complications of untreated stones and the impact on quality of life without treatment as well as with each of the above listed treatments. We also discussed the efficacy of each treatment in its ability to clear the stone burden. With any of these management options I discussed the signs and symptoms of infection and the need for emergent treatment should these be experienced. For each option we discussed the ability of each procedure to clear the patient of their stone burden.    For observation I described the risks which include but are not limited to silent renal damage, life-threatening infection, need for emergent surgery, failure to pass stone, and pain.    For ureteroscopy I described the risks which include heart attack, stroke, pulmonary embolus, death, bleeding, infection, damage to contiguous structures, positioning injury, ureteral stricture, ureteral avulsion, ureteral injury, need for ureteral stent, inability to perform ureteroscopy, need for an interval procedure, inability to clear stone burden, stent discomfort and pain.    For shockwave lithotripsy I described the risks which include arrhythmia, kidney contusion, kidney hemorrhage, need for transfusion, long-term risk of diabetes or hypertension, back discomfort, flank ecchymosis, flank abrasion, inability to break up stone, inability to pass stone fragments, Steinstrasse, infection associated with obstructing stones, need for different surgical procedure, need for repeat shockwave lithotripsy, and death.    He has elected to proceed with lithotripsy.   Plan   1. He's going to stop his aspirin.  2. He'll be scheduled for lithotripsy of his left mid ureteral stone.  3. He will remain on medical expulsive therapy and I also refilled his prescription pain medication.

## 2013-09-15 ENCOUNTER — Encounter (HOSPITAL_COMMUNITY): Payer: Self-pay | Admitting: Emergency Medicine

## 2013-09-15 ENCOUNTER — Emergency Department (HOSPITAL_COMMUNITY)
Admission: EM | Admit: 2013-09-15 | Discharge: 2013-09-15 | Disposition: A | Discharge status: Home / Self Care | Payer: Medicare Other | Attending: Emergency Medicine | Admitting: Emergency Medicine

## 2013-09-15 ENCOUNTER — Emergency Department (HOSPITAL_COMMUNITY): Payer: Medicare Other

## 2013-09-15 DIAGNOSIS — N1330 Unspecified hydronephrosis: Secondary | ICD-10-CM | POA: Diagnosis not present

## 2013-09-15 DIAGNOSIS — I10 Essential (primary) hypertension: Secondary | ICD-10-CM | POA: Diagnosis not present

## 2013-09-15 DIAGNOSIS — M109 Gout, unspecified: Secondary | ICD-10-CM | POA: Diagnosis not present

## 2013-09-15 DIAGNOSIS — Z880 Allergy status to penicillin: Secondary | ICD-10-CM | POA: Insufficient documentation

## 2013-09-15 DIAGNOSIS — Z87891 Personal history of nicotine dependence: Secondary | ICD-10-CM | POA: Insufficient documentation

## 2013-09-15 DIAGNOSIS — Z79899 Other long term (current) drug therapy: Secondary | ICD-10-CM | POA: Insufficient documentation

## 2013-09-15 DIAGNOSIS — E119 Type 2 diabetes mellitus without complications: Secondary | ICD-10-CM | POA: Insufficient documentation

## 2013-09-15 DIAGNOSIS — Z7982 Long term (current) use of aspirin: Secondary | ICD-10-CM | POA: Diagnosis not present

## 2013-09-15 DIAGNOSIS — E7800 Pure hypercholesterolemia, unspecified: Secondary | ICD-10-CM | POA: Diagnosis not present

## 2013-09-15 DIAGNOSIS — N23 Unspecified renal colic: Secondary | ICD-10-CM | POA: Insufficient documentation

## 2013-09-15 DIAGNOSIS — N210 Calculus in bladder: Secondary | ICD-10-CM | POA: Diagnosis not present

## 2013-09-15 LAB — COMPREHENSIVE METABOLIC PANEL
ALT: 15 U/L (ref 0–53)
AST: 19 U/L (ref 0–37)
Albumin: 3.8 g/dL (ref 3.5–5.2)
Alkaline Phosphatase: 95 U/L (ref 39–117)
BUN: 19 mg/dL (ref 6–23)
CALCIUM: 9.7 mg/dL (ref 8.4–10.5)
CO2: 28 meq/L (ref 19–32)
CREATININE: 1.79 mg/dL — AB (ref 0.50–1.35)
Chloride: 95 mEq/L — ABNORMAL LOW (ref 96–112)
GFR, EST AFRICAN AMERICAN: 46 mL/min — AB (ref >90)
GFR, EST NON AFRICAN AMERICAN: 40 mL/min — AB (ref >90)
GLUCOSE: 128 mg/dL — AB (ref 70–99)
Potassium: 4.1 mEq/L (ref 3.7–5.3)
Sodium: 136 mEq/L — ABNORMAL LOW (ref 137–147)
Total Bilirubin: 0.5 mg/dL (ref 0.3–1.2)
Total Protein: 7.1 g/dL (ref 6.0–8.3)

## 2013-09-15 LAB — URINALYSIS, ROUTINE W REFLEX MICROSCOPIC
Bilirubin Urine: NEGATIVE
Glucose, UA: NEGATIVE mg/dL
Ketones, ur: NEGATIVE mg/dL
Leukocytes, UA: NEGATIVE
Nitrite: NEGATIVE
Protein, ur: 30 mg/dL — AB
SPECIFIC GRAVITY, URINE: 1.019 (ref 1.005–1.030)
Urobilinogen, UA: 1 mg/dL (ref 0.0–1.0)
pH: 6 (ref 5.0–8.0)

## 2013-09-15 LAB — CBC
HCT: 43.5 % (ref 39.0–52.0)
HEMOGLOBIN: 14.5 g/dL (ref 13.0–17.0)
MCH: 32.4 pg (ref 26.0–34.0)
MCHC: 33.3 g/dL (ref 30.0–36.0)
MCV: 97.1 fL (ref 78.0–100.0)
PLATELETS: 115 10*3/uL — AB (ref 150–400)
RBC: 4.48 MIL/uL (ref 4.22–5.81)
RDW: 12.8 % (ref 11.5–15.5)
WBC: 9.7 10*3/uL (ref 4.0–10.5)

## 2013-09-15 LAB — URINE MICROSCOPIC-ADD ON

## 2013-09-15 MED ORDER — MORPHINE SULFATE 4 MG/ML IJ SOLN
4.0000 mg | Freq: Once | INTRAMUSCULAR | Status: AC
  Administered 2013-09-15: 4 mg via INTRAVENOUS
  Filled 2013-09-15: qty 1

## 2013-09-15 MED ORDER — KETOROLAC TROMETHAMINE 30 MG/ML IJ SOLN
30.0000 mg | Freq: Once | INTRAMUSCULAR | Status: AC
  Administered 2013-09-15: 30 mg via INTRAVENOUS
  Filled 2013-09-15: qty 1

## 2013-09-15 MED ORDER — ONDANSETRON HCL 4 MG/2ML IJ SOLN
4.0000 mg | INTRAMUSCULAR | Status: AC
  Administered 2013-09-15: 4 mg via INTRAVENOUS
  Filled 2013-09-15: qty 2

## 2013-09-15 MED ORDER — SODIUM CHLORIDE 0.9 % IV SOLN
Freq: Once | INTRAVENOUS | Status: AC
  Administered 2013-09-15: 06:00:00 via INTRAVENOUS

## 2013-09-15 MED ORDER — SODIUM CHLORIDE 0.9 % IV BOLUS (SEPSIS): 1000.0000 mL | Freq: Once | INTRAVENOUS | Status: DC

## 2013-09-15 NOTE — ED Provider Notes (Addendum)
CSN: [id number]     Arrival date & time 09/15/13  0450 History   First MD Initiated Contact with Patient 09/15/13 [phone]     Chief Complaint  Patient presents with  . Flank Pain     (Consider location/radiation/quality/duration/timing/severity/associated sxs/prior Treatment) HPI Patient is a 58 yo man with a history of nephrolithiasis who is POD #3 s/p lithotripsy of 29mm left ureteral stone with Dr. Elesa Massed. Patient has has persistent left flank pain since the procedure. He has noticed "2 small flecks" of gravel while voiding. His pain was worse last night and associated with difficulty voiding. Patient localizes pain to left mid back. He reports a subjected fever.   Pain is 10/10, aching, radiates to left flank. Nothing makes pain worse or better. No dysuria. No gross hematuria.   Past Medical History  Diagnosis Date  . Renal disorder   . Gout   . High cholesterol   . Diabetes mellitus     type 2-diet controlled   Past Surgical History  Procedure Laterality Date  . Joint replacement      rt knee-2008  . Lithotripsy     History reviewed. No pertinent family history. History  Substance Use Topics  . Smoking status: Former Research scientist (life sciences)  . Smokeless tobacco: Never Used  . Alcohol Use: No    Review of Systems Ten point review of symptoms performed and is negative with the exception of symptoms noted above.     Allergies  Dilaudid and Penicillins  Home Medications   Prior to Admission medications   Medication Sig Start Date End Date Taking? Authorizing Provider  allopurinol (ZYLOPRIM) 300 MG tablet Take 300 mg by mouth daily.   Yes Historical Provider, MD  aspirin EC 81 MG tablet Take 81 mg by mouth daily.   Yes Historical Provider, MD  cyanocobalamin (,VITAMIN B-12,) 1000 MCG/ML injection Inject 1,000 mcg into the muscle every 30 (thirty) days. 2nd Wednesday of the month.   Yes Historical Provider, MD  ketorolac (TORADOL) 10 MG tablet Take 10 mg by mouth every 8 (eight)  hours as needed (for pain.).   Yes Historical Provider, MD  loratadine (CLARITIN) 10 MG tablet Take 10 mg by mouth daily.   Yes Historical Provider, MD  pravastatin (PRAVACHOL) 40 MG tablet Take 40 mg by mouth daily.   Yes Historical Provider, MD  promethazine (PHENERGAN) 25 MG tablet Take 25 mg by mouth every 4 (four) hours as needed for nausea or vomiting.   Yes Historical Provider, MD  tamsulosin (FLOMAX) 0.4 MG CAPS capsule Take 0.4 mg by mouth daily.   Yes Historical Provider, MD   BP 140/82  Pulse 74  Temp(Src) 97.7 F (36.5 C) (Oral)  Resp 21  Ht 5\' 10"  (1.778 m)  Wt 240 lb (108.863 kg)  BMI 34.44 kg/m2  SpO2 95% Physical Exam Gen: well developed and well nourished appearing Head: NCAT Eyes: PERL, EOMI Nose: no epistaixis or rhinorrhea Mouth/throat: mucosa is moist and pink Neck: supple, no stridor Lungs: CTA B, no wheezing, rhonchi or rales CV: RRR, no murmur, extremities appear well perfused.  Abd: soft, notender, nondistended Back: no ttp, left cva ttp Skin: warm and dry Ext: normal to inspection, no dependent edema Neuro: CN ii-xii grossly intact, no focal deficits Psyche; normal affect,  calm and cooperative.   ED Course  Procedures (including critical care time) Labs Review  Results for orders placed during the hospital encounter of 09/15/13 (from the past 24 hour(s))  CBC     Status:  None   Collection Time    09/15/13  6:08 AM      Result Value Ref Range   WBC 9.7  4.0 - 10.5 K/uL   RBC 4.48  4.22 - 5.81 MIL/uL   Hemoglobin 14.5  13.0 - 17.0 g/dL   HCT 43.5  39.0 - 52.0 %   MCV 97.1  78.0 - 100.0 fL   MCH 32.4  26.0 - 34.0 pg   MCHC 33.3  30.0 - 36.0 g/dL   RDW 12.8  11.5 - 15.5 %   Platelets PENDING  150 - 400 K/uL  COMPREHENSIVE METABOLIC PANEL     Status: Abnormal   Collection Time    09/15/13  6:08 AM      Result Value Ref Range   Sodium 136 (*) 137 - 147 mEq/L   Potassium 4.1  3.7 - 5.3 mEq/L   Chloride 95 (*) 96 - 112 mEq/L   CO2 28  19 - 32  mEq/L   Glucose, Bld 128 (*) 70 - 99 mg/dL   BUN 19  6 - 23 mg/dL   Creatinine, Ser 1.79 (*) 0.50 - 1.35 mg/dL   Calcium 9.7  8.4 - 10.5 mg/dL   Total Protein 7.1  6.0 - 8.3 g/dL   Albumin 3.8  3.5 - 5.2 g/dL   AST 19  0 - 37 U/L   ALT 15  0 - 53 U/L   Alkaline Phosphatase 95  39 - 117 U/L   Total Bilirubin 0.5  0.3 - 1.2 mg/dL   GFR calc non Af Amer 40 (*) >90 mL/min   GFR calc Af Amer 46 (*) >90 mL/min  URINALYSIS, ROUTINE W REFLEX MICROSCOPIC     Status: Abnormal   Collection Time    09/15/13  6:18 AM      Result Value Ref Range   Color, Urine YELLOW  YELLOW   APPearance CLEAR  CLEAR   Specific Gravity, Urine 1.019  1.005 - 1.030   pH 6.0  5.0 - 8.0   Glucose, UA NEGATIVE  NEGATIVE mg/dL   Hgb urine dipstick TRACE (*) NEGATIVE   Bilirubin Urine NEGATIVE  NEGATIVE   Ketones, ur NEGATIVE  NEGATIVE mg/dL   Protein, ur 30 (*) NEGATIVE mg/dL   Urobilinogen, UA 1.0  0.0 - 1.0 mg/dL   Nitrite NEGATIVE  NEGATIVE   Leukocytes, UA NEGATIVE  NEGATIVE  URINE MICROSCOPIC-ADD ON     Status: None   Collection Time    09/15/13  6:18 AM      Result Value Ref Range   Squamous Epithelial / LPF RARE  RARE   RBC / HPF 0-2  <3 RBC/hpf    MDM   Ddx: ureteral spasm, UTI, pyelonephritis.   Labs notable for clean urinalysis but worsened renal function from baseline of normal. The patient's creatinine has jumped from 1.0 on 06/03/12 to 1.79 today - GFR from 79 to 40. No immediate preop values available in EHR. Suspect that the patient's pain is secondary to ureteral spasm following lithotripsy. However, we will obtain a renal U/S to rule out worsened hydronephrosis which might represent an obstructive process.     Elyn Peers, MD 09/15/13 0715  Patient re-examined and is feeling better. Currently pain free. Results of labs discussed with patient and Dr. Elesa Massed. Patient counseled to discontinue Toradol in favor of previously prescribed oxycodone. Patient has follow up in the office with Dr.  Elesa Massed on Thursday.   Elyn Peers, MD 09/15/13 [phone]

## 2013-09-15 NOTE — Discharge Instructions (Signed)
STOP TAKING TORADOL (KETOROLAC).  DO NOT TAKE THIS MEDICATION IN THE FUTURE.

## 2013-09-15 NOTE — ED Notes (Signed)
Pt had lithotripsy 6/11 for left sided kidney stone.  Pt presents today d/t low grade fever and severe left sided flank pain.  Pt rates pain 10/10 sharp in nature.

## 2013-09-19 DIAGNOSIS — N201 Calculus of ureter: Secondary | ICD-10-CM | POA: Diagnosis not present

## 2013-09-20 DIAGNOSIS — J45909 Unspecified asthma, uncomplicated: Secondary | ICD-10-CM | POA: Diagnosis not present

## 2013-10-16 IMAGING — CT CT CHEST W/O CM
3 of 4 series · 16 of 30 positions shown, 17 images · non-contrast
Comparison: Multiple exams, including 08/03/2010, 10/28/2009, and
04/01/2009

CLINICAL DATA: Right lung nodule follow-up.

CT CHEST WITHOUT CONTRAST
TECHNIQUE: Multidetector CT imaging of the chest was performed
following the standard protocol without IV contrast.

[Series 3: chest w/o · axial · non-contrast · 0.77mm/px · z∈[-239,-74]mm · 4 of 57 slices shown, 5 images]
[im 12/57  mediastinal]
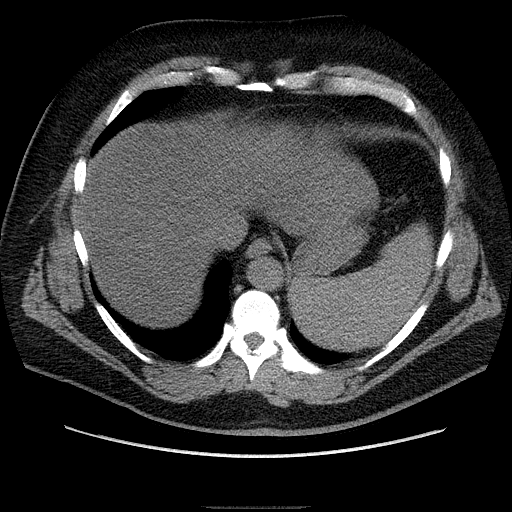
[im 12/57  lung]
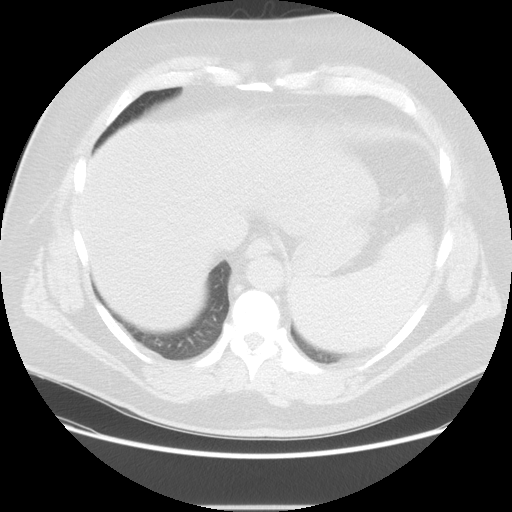
[im 23/57  lung]
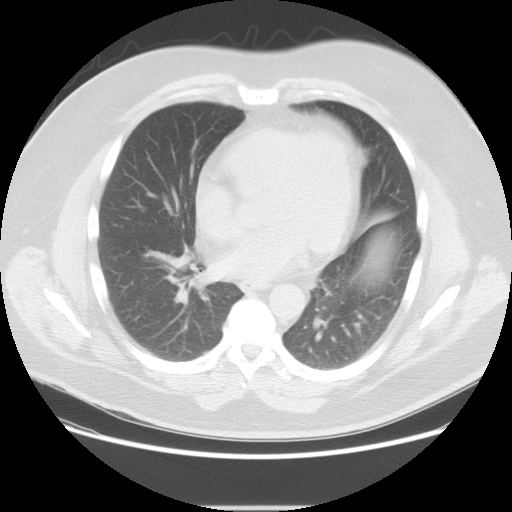
[im 34/57  lung]
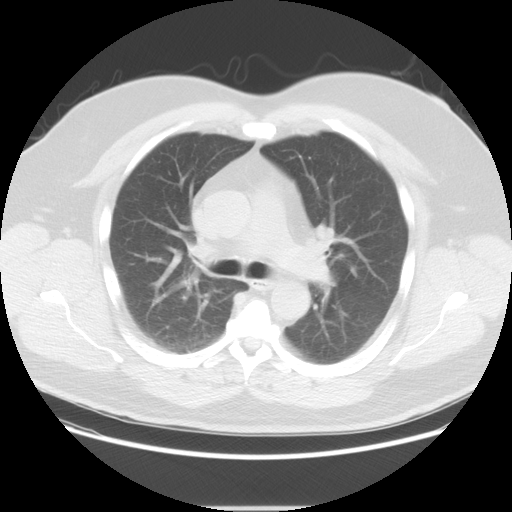
[im 45/57  lung]
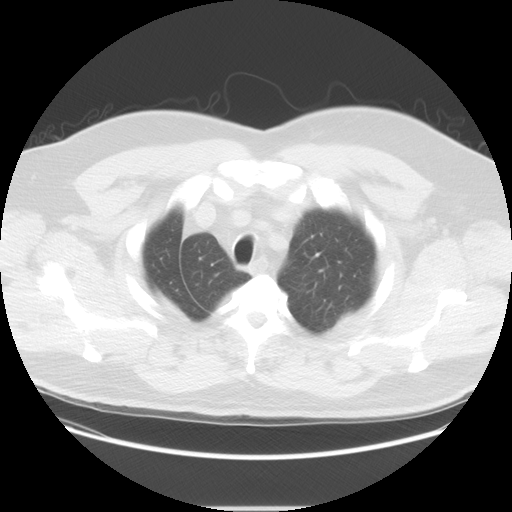

[Series 4: lung windows · axial · 0.77mm/px · z∈[-239,-74]mm · 4 of 57 slices shown]
[im 12/57  lung]
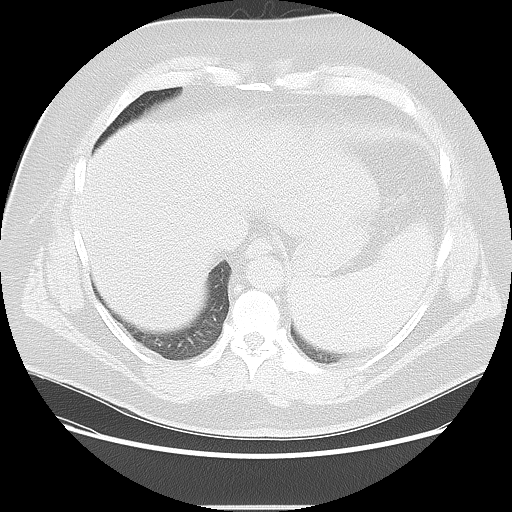
[im 23/57  lung]
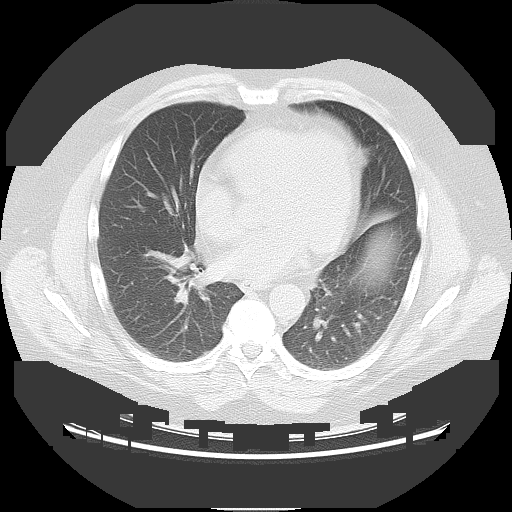
[im 34/57  lung]
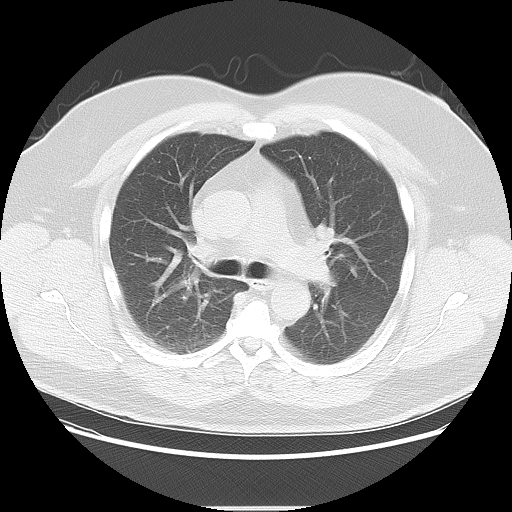
[im 45/57  lung]
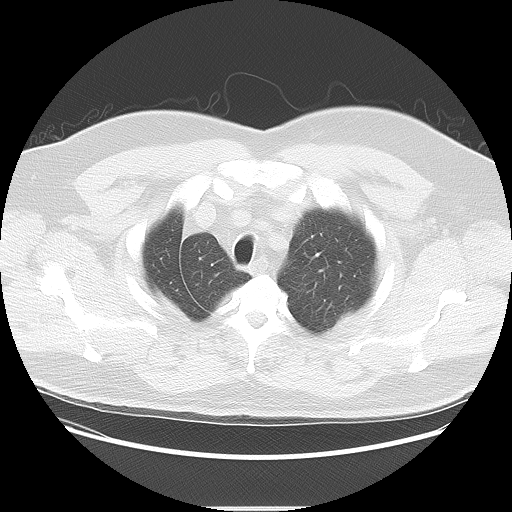

[Series 602: sagittal body · sagittal · 0.77mm/px · 8 of 160 slices shown]
[im 11/160  mediastinal]
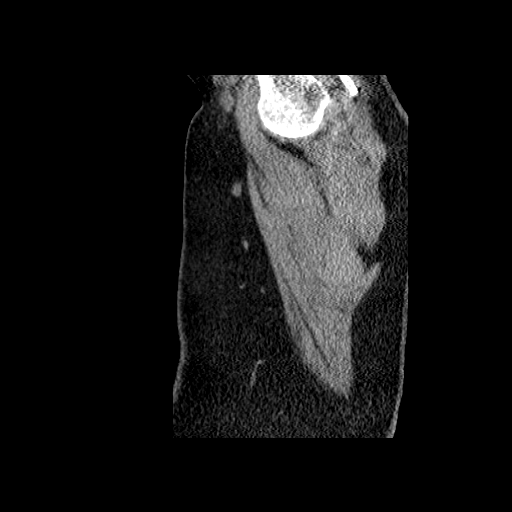
[im 32/160  mediastinal]
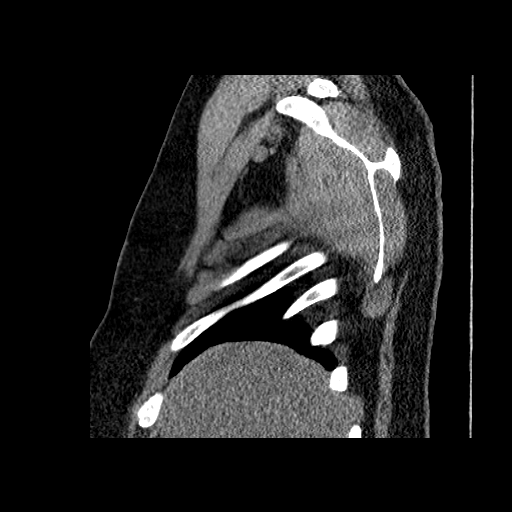
[im 54/160  mediastinal]
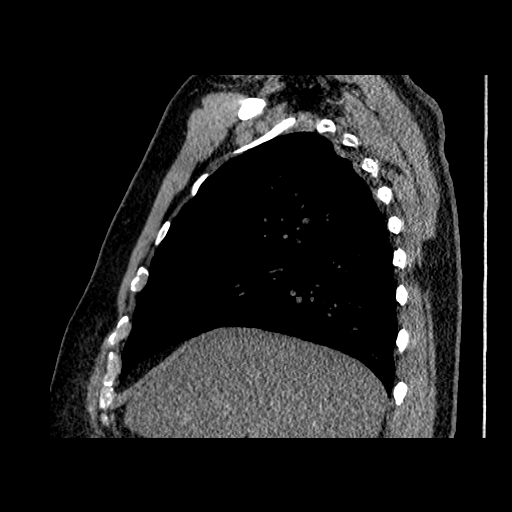
[im 75/160  mediastinal]
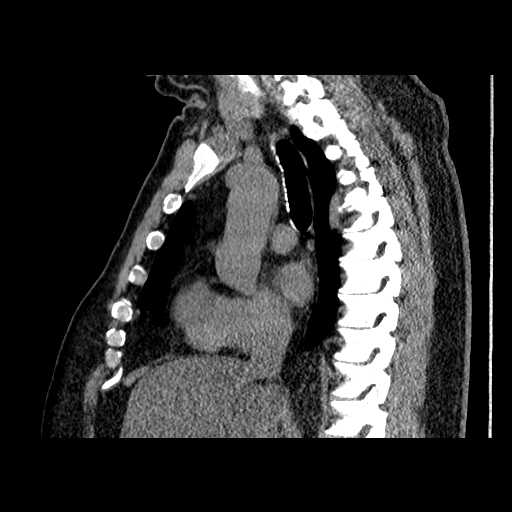
[im 85/160  mediastinal]
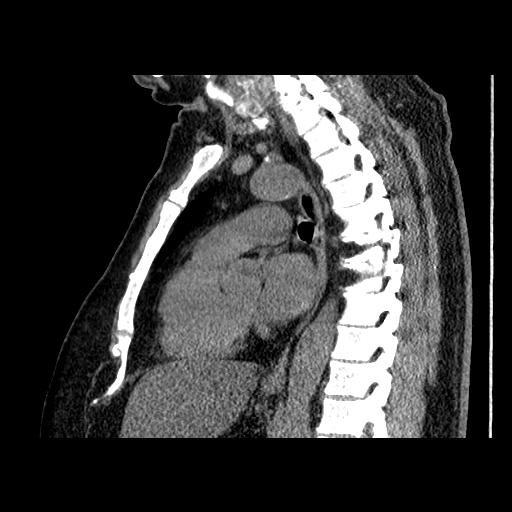
[im 107/160  mediastinal]
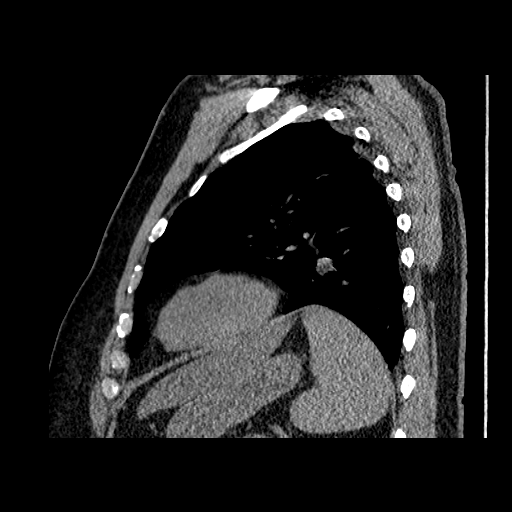
[im 128/160  mediastinal]
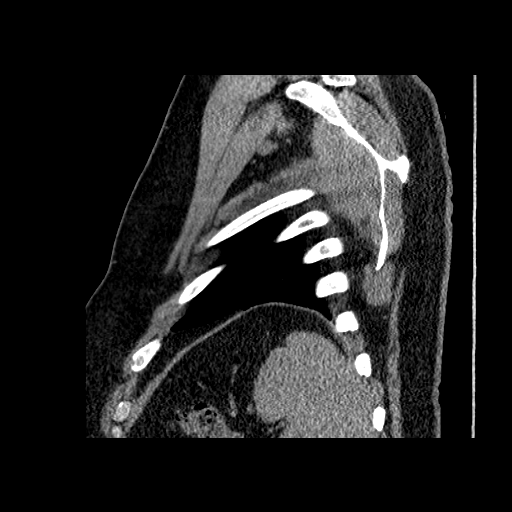
[im 149/160  mediastinal]
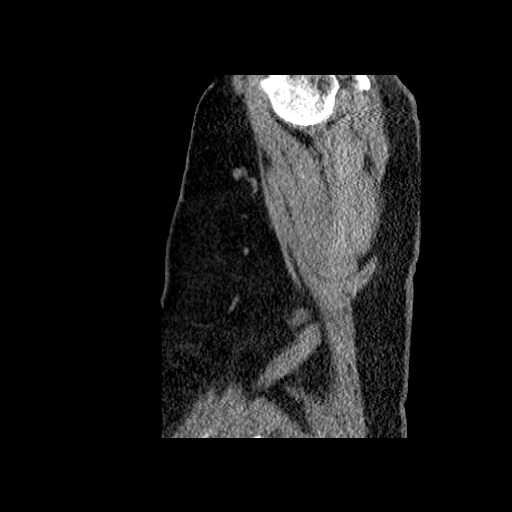

[16 of 30 positions shown; findings below may reference images not displayed]

FINDINGS: No thoracic adenopathy identified.  Incidental note is
made of an azygos fissure and of hepatic steatosis involving the
visualized portion of the liver.  No pleural effusion noted.

A 6 mm solid right lower lobe pulmonary nodule is unchanged on
image 41 of series [DATE] back through 04/01/2009.  Stable nodularity along the upper
margin of the azygos fissure noted.  A 3 mm right middle lobe
nodule is stable on image 34 of series 4.  A faint 5 mm nodule in
the left lower lobe on image 33 of series 4 is stable.  A 3 mm
nodule on image 35 of series 4 is stable on all prior exams as
well.

A 3 mm left lower lobe nodule on image 37 of series 4 is unchanged.

Mild thoracic spondylosis noted.
IMPRESSION: 1.  Stable small bilateral pulmonary nodules compared back through
9366.  The stability over the last 2-2.5 years is typically
considered indicative of a benign etiology, and no further follow-
up is required based on current guidelines.
2.  Diffuse hepatic steatosis.

## 2013-11-27 DIAGNOSIS — R252 Cramp and spasm: Secondary | ICD-10-CM | POA: Diagnosis not present

## 2013-12-13 DIAGNOSIS — H2510 Age-related nuclear cataract, unspecified eye: Secondary | ICD-10-CM | POA: Diagnosis not present

## 2013-12-13 DIAGNOSIS — E119 Type 2 diabetes mellitus without complications: Secondary | ICD-10-CM | POA: Diagnosis not present

## 2013-12-17 DIAGNOSIS — N200 Calculus of kidney: Secondary | ICD-10-CM | POA: Diagnosis not present

## 2013-12-17 DIAGNOSIS — R3129 Other microscopic hematuria: Secondary | ICD-10-CM | POA: Diagnosis not present

## 2013-12-18 DIAGNOSIS — Z96659 Presence of unspecified artificial knee joint: Secondary | ICD-10-CM | POA: Diagnosis not present

## 2013-12-18 DIAGNOSIS — M1710 Unilateral primary osteoarthritis, unspecified knee: Secondary | ICD-10-CM | POA: Diagnosis not present

## 2013-12-31 DIAGNOSIS — Z23 Encounter for immunization: Secondary | ICD-10-CM | POA: Diagnosis not present

## 2014-01-24 DIAGNOSIS — Z96651 Presence of right artificial knee joint: Secondary | ICD-10-CM | POA: Diagnosis not present

## 2014-01-24 DIAGNOSIS — M1712 Unilateral primary osteoarthritis, left knee: Secondary | ICD-10-CM | POA: Diagnosis not present

## 2014-01-24 DIAGNOSIS — M25562 Pain in left knee: Secondary | ICD-10-CM | POA: Diagnosis not present

## 2014-02-04 DIAGNOSIS — M1712 Unilateral primary osteoarthritis, left knee: Secondary | ICD-10-CM | POA: Diagnosis not present

## 2014-02-07 DIAGNOSIS — M179 Osteoarthritis of knee, unspecified: Secondary | ICD-10-CM | POA: Diagnosis not present

## 2014-02-07 DIAGNOSIS — E119 Type 2 diabetes mellitus without complications: Secondary | ICD-10-CM | POA: Diagnosis not present

## 2014-02-14 NOTE — H&P (Signed)
TOTAL KNEE ADMISSION H&P  Patient is being admitted for left total knee arthroplasty.  Subjective:  Chief Complaint:   Left knee primary OA / pain  HPI: Frederick Keller, 58 y.o. male, has a history of pain and functional disability in the left knee due to arthritis and has failed non-surgical conservative treatments for greater than 12 weeks to includeNSAID's and/or analgesics, corticosteriod injections, viscosupplementation injections and activity modification.  Onset of symptoms was gradual, starting 2.5+ years ago with gradually worsening course since that time. The patient noted prior procedures on the knee to include  arthroscopy on the left knee(s).  Patient currently rates pain in the left knee(s) at 8 out of 10 with activity. Patient has night pain, worsening of pain with activity and weight bearing, pain that interferes with activities of daily living, pain with passive range of motion, crepitus and joint swelling.  Patient has evidence of periarticular osteophytes and joint space narrowing by imaging studies.  There is no active infection.  Risks, benefits and expectations were discussed with the patient.  Risks including but not limited to the risk of anesthesia, blood clots, nerve damage, blood vessel damage, failure of the prosthesis, infection and up to and including death.  Patient understand the risks, benefits and expectations and wishes to proceed with surgery.   PCP: No primary care provider on file.  D/C Plans:      Home with HHPT  Post-op Meds:       No Rx given  Tranexamic Acid:      To be given - IV    Decadron:      Is to be given  FYI:     ASA post-op  Norco post-op  Morphine good per patient (No Dilaudid)  CPM - hospital and home (had previously with right TKA, Dr. Theda Sers)   Past Medical History  Diagnosis Date  . Renal disorder   . Gout   . High cholesterol   . Diabetes mellitus     type 2-diet controlled    Past Surgical History  Procedure Laterality  Date  . Joint replacement      rt knee-2008  . Lithotripsy      No prescriptions prior to admission   Allergies  Allergen Reactions  . Dilaudid [Hydromorphone Hcl] Nausea And Vomiting  . Penicillins Hives    History  Substance Use Topics  . Smoking status: Former Research scientist (life sciences)  . Smokeless tobacco: Never Used  . Alcohol Use: No       Review of Systems  Constitutional: Negative.   HENT: Positive for hearing loss.   Eyes: Negative.   Cardiovascular: Negative.   Musculoskeletal: Positive for joint pain.  Skin: Negative.   Neurological: Negative.   Endo/Heme/Allergies: Negative.   Psychiatric/Behavioral: Negative.     Objective:  Physical Exam  Constitutional: He is oriented to person, place, and time. He appears well-developed and well-nourished.  HENT:  Head: Normocephalic and atraumatic.  Mouth/Throat: He has dentures.  Eyes: Pupils are equal, round, and reactive to light.  Neck: Neck supple. No JVD present. No tracheal deviation present. No thyromegaly present.  Cardiovascular: Normal rate, regular rhythm, normal heart sounds and intact distal pulses.   Respiratory: Effort normal and breath sounds normal. No stridor. No respiratory distress. He has no wheezes.  GI: Soft. There is no tenderness. There is no guarding.  Musculoskeletal:       Left knee: He exhibits decreased range of motion, swelling and bony tenderness. He exhibits no ecchymosis, no deformity,  no laceration and no erythema. Tenderness found.  Lymphadenopathy:    He has no cervical adenopathy.  Neurological: He is alert and oriented to person, place, and time.  Skin: Skin is warm and dry.  Psychiatric: He has a normal mood and affect.     Labs:  Estimated body mass index is 34.44 kg/(m^2) as calculated from the following:   Height as of 09/15/13: 5\' 10"  (1.778 m).   Weight as of 09/15/13: 108.863 kg (240 lb).   Imaging Review Plain radiographs demonstrate severe degenerative joint disease of the left  knee(s). The overall alignment is mild varus. The bone quality appears to be good for age and reported activity level.  Assessment/Plan:  End stage arthritis, left knee   The patient history, physical examination, clinical judgment of the provider and imaging studies are consistent with end stage degenerative joint disease of the left knee(s) and total knee arthroplasty is deemed medically necessary. The treatment options including medical management, injection therapy arthroscopy and arthroplasty were discussed at length. The risks and benefits of total knee arthroplasty were presented and reviewed. The risks due to aseptic loosening, infection, stiffness, patella tracking problems, thromboembolic complications and other imponderables were discussed. The patient acknowledged the explanation, agreed to proceed with the plan and consent was signed. Patient is being admitted for inpatient treatment for surgery, pain control, PT, OT, prophylactic antibiotics, VTE prophylaxis, progressive ambulation and ADL's and discharge planning. The patient is planning to be discharged home with home health services.     Frederick Pugh [name]   PA-C  02/14/2014, 10:29 AM

## 2014-02-18 ENCOUNTER — Encounter (HOSPITAL_COMMUNITY): Payer: Self-pay

## 2014-02-18 ENCOUNTER — Ambulatory Visit (HOSPITAL_COMMUNITY)
Admission: RE | Admit: 2014-02-18 | Discharge: 2014-02-18 | Disposition: A | Discharge status: Home / Self Care | Payer: Medicare Other | Source: Ambulatory Visit | Attending: Anesthesiology | Admitting: Anesthesiology

## 2014-02-18 ENCOUNTER — Encounter (HOSPITAL_COMMUNITY)
Admission: RE | Admit: 2014-02-18 | Discharge: 2014-02-18 | Disposition: A | Discharge status: Home / Self Care | Payer: Medicare Other | Source: Ambulatory Visit | Attending: Orthopedic Surgery | Admitting: Orthopedic Surgery

## 2014-02-18 DIAGNOSIS — R911 Solitary pulmonary nodule: Secondary | ICD-10-CM

## 2014-02-18 DIAGNOSIS — Z01818 Encounter for other preprocedural examination: Secondary | ICD-10-CM | POA: Insufficient documentation

## 2014-02-18 HISTORY — DX: Personal history of urinary calculi: Z87.442

## 2014-02-18 HISTORY — DX: Solitary pulmonary nodule: R91.1

## 2014-02-18 HISTORY — DX: Unspecified osteoarthritis, unspecified site: M19.90

## 2014-02-18 LAB — CBC
HEMATOCRIT: 45.7 % (ref 39.0–52.0)
HEMOGLOBIN: 15.3 g/dL (ref 13.0–17.0)
MCH: 32.7 pg (ref 26.0–34.0)
MCHC: 33.5 g/dL (ref 30.0–36.0)
MCV: 97.6 fL (ref 78.0–100.0)
Platelets: 132 10*3/uL — ABNORMAL LOW (ref 150–400)
RBC: 4.68 MIL/uL (ref 4.22–5.81)
RDW: 13.1 % (ref 11.5–15.5)
WBC: 8.7 10*3/uL (ref 4.0–10.5)

## 2014-02-18 LAB — BASIC METABOLIC PANEL
Anion gap: 10 (ref 5–15)
BUN: 11 mg/dL (ref 6–23)
CHLORIDE: 99 meq/L (ref 96–112)
CO2: 30 mEq/L (ref 19–32)
Calcium: 9.6 mg/dL (ref 8.4–10.5)
Creatinine, Ser: 0.96 mg/dL (ref 0.50–1.35)
GFR calc non Af Amer: 89 mL/min — ABNORMAL LOW (ref >90)
Glucose, Bld: 100 mg/dL — ABNORMAL HIGH (ref 70–99)
Potassium: 4.5 mEq/L (ref 3.7–5.3)
Sodium: 139 mEq/L (ref 137–147)

## 2014-02-18 LAB — URINALYSIS, ROUTINE W REFLEX MICROSCOPIC
BILIRUBIN URINE: NEGATIVE
GLUCOSE, UA: NEGATIVE mg/dL
HGB URINE DIPSTICK: NEGATIVE
Ketones, ur: NEGATIVE mg/dL
Leukocytes, UA: NEGATIVE
Nitrite: NEGATIVE
PH: 6 (ref 5.0–8.0)
Protein, ur: 100 mg/dL — AB
SPECIFIC GRAVITY, URINE: 1.021 (ref 1.005–1.030)
Urobilinogen, UA: 1 mg/dL (ref 0.0–1.0)

## 2014-02-18 LAB — URINE MICROSCOPIC-ADD ON

## 2014-02-18 LAB — APTT: aPTT: 34 seconds (ref 24–37)

## 2014-02-18 LAB — PROTIME-INR
INR: 1.09 (ref 0.00–1.49)
Prothrombin Time: 14.2 seconds (ref 11.6–15.2)

## 2014-02-18 LAB — SURGICAL PCR SCREEN
MRSA, PCR: NEGATIVE
Staphylococcus aureus: NEGATIVE

## 2014-02-18 NOTE — Progress Notes (Signed)
Surgery clearance note Dr. Laurann Montana 02/07/14 on chart

## 2014-02-18 NOTE — Patient Instructions (Addendum)
[place]  02/18/2014   Your procedure is scheduled on: Tuesday 02/25/14  Report to [facility] at 12:40 PM.  Call this number if you have problems the morning of surgery [phone]: [phone]   Remember:   Do not eat food After Midnight. Clear liquids from midnight until 09:40am on 02/25/14 then nothing.     Do not wear jewelry, make-up or nail polish.  Do not wear lotions, powders, or perfumes.   Do not shave 48 hours prior to surgery. Men may shave face and neck.  Do not bring valuables to the hospital.  Contacts, dentures or bridgework may not be worn into surgery.  Leave suitcase in the car. After surgery it may be brought to your room.  For patients admitted to the hospital, checkout time is 11:00 AM the day of discharge.   Please read over the following fact sheets that you were given: MRSA Information Paulette Blanch, RN  pre op nurse call if needed [phone]    [facility] - Preparing for Surgery Before surgery, you can play an important role.  Because skin is not sterile, your skin needs to be as free of germs as possible.  You can reduce the number of germs on your skin by washing with CHG (chlorahexidine gluconate) soap before surgery.  CHG is an antiseptic cleaner which kills germs and bonds with the skin to continue killing germs even after washing. Please DO NOT use if you have an allergy to CHG or antibacterial soaps.  If your skin becomes reddened/irritated stop using the CHG and inform your nurse when you arrive at Short Stay. Do not shave (including legs and underarms) for at least 48 hours prior to the first CHG shower.  You may shave your face/neck. Please follow these instructions carefully:  1.  Shower with CHG Soap the night before surgery and the  morning of Surgery.  2.  If you choose to wash your hair, wash your hair first as usual with your  normal  shampoo.  3.  After you shampoo, rinse your hair and body thoroughly to remove the   shampoo.                            4.  Use CHG as you would any other liquid soap.  You can apply chg directly  to the skin and wash                       Gently with a scrungie or clean washcloth.  5.  Apply the CHG Soap to your body ONLY FROM THE NECK DOWN.   Do not use on face/ open                           Wound or open sores. Avoid contact with eyes, ears mouth and genitals (private parts).                       Wash face,  Genitals (private parts) with your normal soap.             6.  Wash thoroughly, paying special attention to the area where your surgery  will be performed.  7.  Thoroughly rinse your body with warm water from the neck down.  8.  DO NOT shower/wash with your normal soap after using and rinsing off  the CHG Soap.                9.  Pat yourself dry with a clean towel.            10.  Wear clean pajamas.            11.  Place clean sheets on your bed the night of your first shower and do not  sleep with pets. Day of Surgery : Do not apply any lotions/deodorants the morning of surgery.  Please wear clean clothes to the hospital/surgery center.  FAILURE TO FOLLOW THESE INSTRUCTIONS MAY RESULT IN THE CANCELLATION OF YOUR SURGERY PATIENT SIGNATURE_________________________________  NURSE SIGNATURE__________________________________  ________________________________________________________________________  WHAT IS A BLOOD TRANSFUSION? Blood Transfusion Information  A transfusion is the replacement of blood or some of its parts. Blood is made up of multiple cells which provide different functions.  Red blood cells carry oxygen and are used for blood loss replacement.  White blood cells fight against infection.  Platelets control bleeding.  Plasma helps clot blood.  Other blood products are available for specialized needs, such as hemophilia or other clotting disorders. BEFORE THE TRANSFUSION  Who gives blood for transfusions?   Healthy volunteers who are fully  evaluated to make sure their blood is safe. This is blood bank blood. Transfusion therapy is the safest it has ever been in the practice of medicine. Before blood is taken from a donor, a complete history is taken to make sure that person has no history of diseases nor engages in risky social behavior (examples are intravenous drug use or sexual activity with multiple partners). The donor's travel history is screened to minimize risk of transmitting infections, such as malaria. The donated blood is tested for signs of infectious diseases, such as HIV and hepatitis. The blood is then tested to be sure it is compatible with you in order to minimize the chance of a transfusion reaction. If you or a relative donates blood, this is often done in anticipation of surgery and is not appropriate for emergency situations. It takes many days to process the donated blood. RISKS AND COMPLICATIONS Although transfusion therapy is very safe and saves many lives, the main dangers of transfusion include:  1. Getting an infectious disease. 2. Developing a transfusion reaction. This is an allergic reaction to something in the blood you were given. Every precaution is taken to prevent this. The decision to have a blood transfusion has been considered carefully by your caregiver before blood is given. Blood is not given unless the benefits outweigh the risks. AFTER THE TRANSFUSION  Right after receiving a blood transfusion, you will usually feel much better and more energetic. This is especially true if your red blood cells have gotten low (anemic). The transfusion raises the level of the red blood cells which carry oxygen, and this usually causes an energy increase.  The nurse administering the transfusion will monitor you carefully for complications. HOME CARE INSTRUCTIONS  No special instructions are needed after a transfusion. You may find your energy is better. Speak with your caregiver about any limitations on activity  for underlying diseases you may have. SEEK MEDICAL CARE IF:   Your condition is not improving after your transfusion.  You develop redness or irritation at the intravenous (IV) site. SEEK IMMEDIATE MEDICAL CARE IF:  Any of the following symptoms occur over the next 12 hours:  Shaking chills.  You have a temperature by mouth above 102 F (38.9 C), not controlled by  medicine.  Chest, back, or muscle pain.  People around you feel you are not acting correctly or are confused.  Shortness of breath or difficulty breathing.  Dizziness and fainting.  You get a rash or develop hives.  You have a decrease in urine output.  Your urine turns a dark color or changes to pink, red, or brown. Any of the following symptoms occur over the next 10 days:  You have a temperature by mouth above 102 F (38.9 C), not controlled by medicine.  Shortness of breath.  Weakness after normal activity.  The white part of the eye turns yellow (jaundice).  You have a decrease in the amount of urine or are urinating less often.  Your urine turns a dark color or changes to pink, red, or brown. Document Released: 03/18/2000 Document Revised: 06/13/2011 Document Reviewed: 11/05/2007 Exit[employer] Patient Information 2014 [employer], [place].  _______________________________________________________________________   CLEAR LIQUID DIET   Foods Allowed                                                                     Foods Excluded  Coffee and tea, regular and decaf                             liquids that you cannot  Plain Jell-O in any flavor                                             see through such as: Fruit ices (not with fruit pulp)                                     milk, soups, orange juice  Iced Popsicles                                    All solid food Carbonated beverages, regular and diet                                    Cranberry, grape and apple juices Sports drinks like  Gatorade Lightly seasoned clear broth or consume(fat free) Sugar, honey syrup  Sample Menu Breakfast                                Lunch                                     Supper Cranberry juice                    Beef broth                            Chicken broth Jell-O  Grape juice                           Apple juice Coffee or tea                        Jell-O                                      Popsicle                                                Coffee or tea                        Coffee or tea  _____________________________________________________________________    Incentive Spirometer  An incentive spirometer is a tool that can help keep your lungs clear and active. This tool measures how well you are filling your lungs with each breath. Taking long deep breaths may help reverse or decrease the chance of developing breathing (pulmonary) problems (especially infection) following:  A long period of time when you are unable to move or be active. BEFORE THE PROCEDURE   If the spirometer includes an indicator to show your best effort, your nurse or respiratory therapist will set it to a desired goal.  If possible, sit up straight or lean slightly forward. Try not to slouch.  Hold the incentive spirometer in an upright position. INSTRUCTIONS FOR USE  3. Sit on the edge of your bed if possible, or sit up as far as you can in bed or on a chair. 4. Hold the incentive spirometer in an upright position. 5. Breathe out normally. 6. Place the mouthpiece in your mouth and seal your lips tightly around it. 7. Breathe in slowly and as deeply as possible, raising the piston or the ball toward the top of the column. 8. Hold your breath for 3-5 seconds or for as long as possible. Allow the piston or ball to fall to the bottom of the column. 9. Remove the mouthpiece from your mouth and breathe out normally. 10. Rest for a few seconds and repeat Steps  1 through 7 at least 10 times every 1-2 hours when you are awake. Take your time and take a few normal breaths between deep breaths. 11. The spirometer may include an indicator to show your best effort. Use the indicator as a goal to work toward during each repetition. 12. After each set of 10 deep breaths, practice coughing to be sure your lungs are clear. If you have an incision (the cut made at the time of surgery), support your incision when coughing by placing a pillow or rolled up towels firmly against it. Once you are able to get out of bed, walk around indoors and cough well. You may stop using the incentive spirometer when instructed by your caregiver.  RISKS AND COMPLICATIONS  Take your time so you do not get dizzy or light-headed.  If you are in pain, you may need to take or ask for pain medication before doing incentive spirometry. It is harder to take a deep breath if you are having pain. AFTER USE  Rest and breathe slowly and easily.  It can be  helpful to keep track of a log of your progress. Your caregiver can provide you with a simple table to help with this. If you are using the spirometer at home, follow these instructions: [place] IF:   You are having difficultly using the spirometer.  You have trouble using the spirometer as often as instructed.  Your pain medication is not giving enough relief while using the spirometer.  You develop fever of 100.5 F (38.1 C) or higher. SEEK IMMEDIATE MEDICAL CARE IF:   You cough up bloody sputum that had not been present before.  You develop fever of 102 F (38.9 C) or greater.  You develop worsening pain at or near the incision site. MAKE SURE YOU:   Understand these instructions.  Will watch your condition.  Will get help right away if you are not doing well or get worse. Document Released: 08/01/2006 Document Revised: 06/13/2011 Document Reviewed: 10/02/2006 [facility] Patient Information 2014 [place],  [place].   ________________________________________________________________________

## 2014-02-25 ENCOUNTER — Encounter (HOSPITAL_COMMUNITY): Payer: Self-pay | Admitting: *Deleted

## 2014-02-25 ENCOUNTER — Inpatient Hospital Stay (HOSPITAL_COMMUNITY)
Admission: RE | Admit: 2014-02-25 | Discharge: 2014-02-26 | DRG: 470 | Disposition: A | Discharge status: Home Health | Payer: Medicare Other | Source: Ambulatory Visit | Attending: Orthopedic Surgery | Admitting: Orthopedic Surgery

## 2014-02-25 ENCOUNTER — Encounter (HOSPITAL_COMMUNITY)
Admission: RE | Disposition: A | Discharge status: Home Health | Payer: Self-pay | Source: Ambulatory Visit | Attending: Orthopedic Surgery

## 2014-02-25 ENCOUNTER — Inpatient Hospital Stay (HOSPITAL_COMMUNITY): Payer: Medicare Other | Admitting: Anesthesiology

## 2014-02-25 DIAGNOSIS — E669 Obesity, unspecified: Secondary | ICD-10-CM | POA: Diagnosis present

## 2014-02-25 DIAGNOSIS — M1712 Unilateral primary osteoarthritis, left knee: Secondary | ICD-10-CM | POA: Diagnosis present

## 2014-02-25 DIAGNOSIS — Z96652 Presence of left artificial knee joint: Secondary | ICD-10-CM

## 2014-02-25 DIAGNOSIS — E780 Pure hypercholesterolemia: Secondary | ICD-10-CM | POA: Diagnosis present

## 2014-02-25 DIAGNOSIS — E668 Other obesity: Secondary | ICD-10-CM | POA: Diagnosis present

## 2014-02-25 DIAGNOSIS — M25562 Pain in left knee: Secondary | ICD-10-CM | POA: Diagnosis not present

## 2014-02-25 DIAGNOSIS — E119 Type 2 diabetes mellitus without complications: Secondary | ICD-10-CM | POA: Diagnosis present

## 2014-02-25 DIAGNOSIS — Z96659 Presence of unspecified artificial knee joint: Secondary | ICD-10-CM

## 2014-02-25 DIAGNOSIS — M659 Synovitis and tenosynovitis, unspecified: Secondary | ICD-10-CM | POA: Diagnosis present

## 2014-02-25 DIAGNOSIS — Z87891 Personal history of nicotine dependence: Secondary | ICD-10-CM

## 2014-02-25 DIAGNOSIS — M179 Osteoarthritis of knee, unspecified: Secondary | ICD-10-CM | POA: Diagnosis not present

## 2014-02-25 DIAGNOSIS — Z6834 Body mass index (BMI) 34.0-34.9, adult: Secondary | ICD-10-CM | POA: Diagnosis not present

## 2014-02-25 HISTORY — PX: TOTAL KNEE ARTHROPLASTY: SHX125

## 2014-02-25 LAB — GLUCOSE, CAPILLARY
Glucose-Capillary: 113 mg/dL — ABNORMAL HIGH (ref 70–99)
Glucose-Capillary: 109 mg/dL — ABNORMAL HIGH (ref 70–99)

## 2014-02-25 LAB — TYPE AND SCREEN
ABO/RH(D): O POS
Antibody Screen: NEGATIVE

## 2014-02-25 SURGERY — ARTHROPLASTY, KNEE, TOTAL
Anesthesia: Spinal | Site: Knee | Laterality: Left

## 2014-02-25 MED ORDER — CLINDAMYCIN PHOSPHATE 600 MG/50ML IV SOLN
600.0000 mg | Freq: Four times a day (QID) | INTRAVENOUS | Status: AC
Start: 2014-02-25 — End: 2014-02-26
  Administered 2014-02-25 – 2014-02-26 (×2): 600 mg via INTRAVENOUS
  Filled 2014-02-25 (×2): qty 50

## 2014-02-25 MED ORDER — MORPHINE SULFATE 2 MG/ML IJ SOLN
1.0000 mg | INTRAMUSCULAR | Status: DC | PRN
  Administered 2014-02-26: 1 mg via INTRAVENOUS
  Filled 2014-02-25: qty 1

## 2014-02-25 MED ORDER — SODIUM CHLORIDE 0.9 % IR SOLN
Status: DC | PRN
  Administered 2014-02-25: 1000 mL

## 2014-02-25 MED ORDER — SODIUM CHLORIDE 0.9 % IV SOLN
1000.0000 mg | Freq: Once | INTRAVENOUS | Status: DC
  Filled 2014-02-25: qty 10

## 2014-02-25 MED ORDER — DEXAMETHASONE SODIUM PHOSPHATE 10 MG/ML IJ SOLN
INTRAMUSCULAR | Status: DC | PRN
  Administered 2014-02-25: 10 mg via INTRAVENOUS

## 2014-02-25 MED ORDER — MIDAZOLAM HCL 5 MG/5ML IJ SOLN
INTRAMUSCULAR | Status: DC | PRN
  Administered 2014-02-25: 2 mg via INTRAVENOUS

## 2014-02-25 MED ORDER — BUPIVACAINE-EPINEPHRINE (PF) 0.25% -1:200000 IJ SOLN
INTRAMUSCULAR | Status: DC | PRN
Start: 2014-02-25 — End: 2014-02-25
  Administered 2014-02-25: 30 mL

## 2014-02-25 MED ORDER — BUPIVACAINE IN DEXTROSE 0.75-8.25 % IT SOLN
INTRATHECAL | Status: DC | PRN
  Administered 2014-02-25: 2 mL via INTRATHECAL

## 2014-02-25 MED ORDER — ALUM & MAG HYDROXIDE-SIMETH 200-200-20 MG/5ML PO SUSP: 30.0000 mL | ORAL | Status: DC | PRN

## 2014-02-25 MED ORDER — DOCUSATE SODIUM 100 MG PO CAPS
100.0000 mg | ORAL_CAPSULE | Freq: Two times a day (BID) | ORAL | Status: DC
  Administered 2014-02-25 – 2014-02-26 (×2): 100 mg via ORAL

## 2014-02-25 MED ORDER — FENTANYL CITRATE 0.05 MG/ML IJ SOLN
INTRAMUSCULAR | Status: DC | PRN
Start: 2014-02-25 — End: 2014-02-25
  Administered 2014-02-25 (×2): 50 ug via INTRAVENOUS

## 2014-02-25 MED ORDER — PROMETHAZINE HCL 25 MG/ML IJ SOLN: 6.2500 mg | INTRAMUSCULAR | Status: DC | PRN

## 2014-02-25 MED ORDER — METHOCARBAMOL 500 MG PO TABS: 500.0000 mg | ORAL_TABLET | Freq: Four times a day (QID) | ORAL | Status: DC | PRN

## 2014-02-25 MED ORDER — PRAVASTATIN SODIUM 40 MG PO TABS
40.0000 mg | ORAL_TABLET | Freq: Every day | ORAL | Status: DC
  Administered 2014-02-25: 40 mg via ORAL
  Filled 2014-02-25 (×2): qty 1

## 2014-02-25 MED ORDER — METOCLOPRAMIDE HCL 5 MG/ML IJ SOLN: 5.0000 mg | Freq: Three times a day (TID) | INTRAMUSCULAR | Status: DC | PRN

## 2014-02-25 MED ORDER — PHENYLEPHRINE HCL 10 MG/ML IJ SOLN
10.0000 mg | INTRAVENOUS | Status: DC | PRN
  Administered 2014-02-25: 100 ug/min via INTRAVENOUS

## 2014-02-25 MED ORDER — DEXAMETHASONE SODIUM PHOSPHATE 10 MG/ML IJ SOLN
INTRAMUSCULAR | Status: AC
  Filled 2014-02-25: qty 1

## 2014-02-25 MED ORDER — SODIUM CHLORIDE 0.9 % IJ SOLN
INTRAMUSCULAR | Status: DC | PRN
Start: 2014-02-25 — End: 2014-02-25
  Administered 2014-02-25: 30 mL via INTRAVENOUS

## 2014-02-25 MED ORDER — KETOROLAC TROMETHAMINE 10 MG PO TABS
10.0000 mg | ORAL_TABLET | Freq: Every day | ORAL | Status: DC
  Filled 2014-02-25 (×2): qty 1

## 2014-02-25 MED ORDER — MAGNESIUM CITRATE PO SOLN: 1.0000 | Freq: Once | ORAL | Status: AC | PRN

## 2014-02-25 MED ORDER — PROPOFOL 10 MG/ML IV BOLUS
INTRAVENOUS | Status: AC
  Filled 2014-02-25: qty 20

## 2014-02-25 MED ORDER — METHOCARBAMOL 1000 MG/10ML IJ SOLN
500.0000 mg | Freq: Four times a day (QID) | INTRAVENOUS | Status: DC | PRN
  Administered 2014-02-25: 500 mg via INTRAVENOUS
  Filled 2014-02-25 (×2): qty 5

## 2014-02-25 MED ORDER — FERROUS SULFATE 325 (65 FE) MG PO TABS
325.0000 mg | ORAL_TABLET | Freq: Three times a day (TID) | ORAL | Status: DC
  Administered 2014-02-26 (×2): 325 mg via ORAL
  Filled 2014-02-25 (×5): qty 1

## 2014-02-25 MED ORDER — PHENOL 1.4 % MT LIQD
1.0000 | OROMUCOSAL | Status: DC | PRN
  Filled 2014-02-25: qty 177

## 2014-02-25 MED ORDER — ONDANSETRON HCL 4 MG/2ML IJ SOLN: 4.0000 mg | Freq: Four times a day (QID) | INTRAMUSCULAR | Status: DC | PRN

## 2014-02-25 MED ORDER — ONDANSETRON HCL 4 MG PO TABS: 4.0000 mg | ORAL_TABLET | Freq: Four times a day (QID) | ORAL | Status: DC | PRN

## 2014-02-25 MED ORDER — CLINDAMYCIN PHOSPHATE 900 MG/50ML IV SOLN
INTRAVENOUS | Status: AC
  Filled 2014-02-25: qty 50

## 2014-02-25 MED ORDER — METOCLOPRAMIDE HCL 10 MG PO TABS: 5.0000 mg | ORAL_TABLET | Freq: Three times a day (TID) | ORAL | Status: DC | PRN

## 2014-02-25 MED ORDER — ONDANSETRON HCL 4 MG/2ML IJ SOLN
INTRAMUSCULAR | Status: DC | PRN
  Administered 2014-02-25: 4 mg via INTRAVENOUS

## 2014-02-25 MED ORDER — CLINDAMYCIN PHOSPHATE 900 MG/50ML IV SOLN
900.0000 mg | INTRAVENOUS | Status: AC
  Administered 2014-02-25: 900 mg via INTRAVENOUS

## 2014-02-25 MED ORDER — SODIUM CHLORIDE 0.9 % IJ SOLN
INTRAMUSCULAR | Status: AC
  Filled 2014-02-25: qty 50

## 2014-02-25 MED ORDER — BISACODYL 10 MG RE SUPP: 10.0000 mg | Freq: Every day | RECTAL | Status: DC | PRN

## 2014-02-25 MED ORDER — LACTATED RINGERS IV SOLN
INTRAVENOUS | Status: DC
  Administered 2014-02-25: 17:00:00 via INTRAVENOUS
  Administered 2014-02-25: 1000 mL via INTRAVENOUS
  Administered 2014-02-25: 16:00:00 via INTRAVENOUS

## 2014-02-25 MED ORDER — PROPOFOL 10 MG/ML IV BOLUS
INTRAVENOUS | Status: DC | PRN
  Administered 2014-02-25: 20 mg via INTRAVENOUS
  Administered 2014-02-25: 50 mg via INTRAVENOUS

## 2014-02-25 MED ORDER — ASPIRIN EC 325 MG PO TBEC
325.0000 mg | DELAYED_RELEASE_TABLET | Freq: Two times a day (BID) | ORAL | Status: DC
  Administered 2014-02-26: 325 mg via ORAL
  Filled 2014-02-25 (×3): qty 1

## 2014-02-25 MED ORDER — HYDROCODONE-ACETAMINOPHEN 7.5-325 MG PO TABS
1.0000 | ORAL_TABLET | ORAL | Status: DC
  Administered 2014-02-25: 2 via ORAL
  Administered 2014-02-25: 1 via ORAL
  Administered 2014-02-26 (×3): 2 via ORAL
  Filled 2014-02-25: qty 1
  Filled 2014-02-25 (×4): qty 2

## 2014-02-25 MED ORDER — 0.9 % SODIUM CHLORIDE (POUR BTL) OPTIME
TOPICAL | Status: DC | PRN
Start: 2014-02-25 — End: 2014-02-25
  Administered 2014-02-25: 1000 mL

## 2014-02-25 MED ORDER — PHENYLEPHRINE HCL 10 MG/ML IJ SOLN
INTRAMUSCULAR | Status: AC
  Filled 2014-02-25: qty 1

## 2014-02-25 MED ORDER — FENTANYL CITRATE 0.05 MG/ML IJ SOLN: 25.0000 ug | INTRAMUSCULAR | Status: DC | PRN

## 2014-02-25 MED ORDER — FENTANYL CITRATE 0.05 MG/ML IJ SOLN
INTRAMUSCULAR | Status: AC
  Filled 2014-02-25: qty 2

## 2014-02-25 MED ORDER — KETOROLAC TROMETHAMINE 30 MG/ML IJ SOLN
INTRAMUSCULAR | Status: AC
  Filled 2014-02-25: qty 1

## 2014-02-25 MED ORDER — DEXAMETHASONE SODIUM PHOSPHATE 10 MG/ML IJ SOLN: 10.0000 mg | Freq: Once | INTRAMUSCULAR | Status: DC

## 2014-02-25 MED ORDER — ONDANSETRON HCL 4 MG/2ML IJ SOLN
INTRAMUSCULAR | Status: AC
  Filled 2014-02-25: qty 2

## 2014-02-25 MED ORDER — POLYETHYLENE GLYCOL 3350 17 G PO PACK
17.0000 g | PACK | Freq: Two times a day (BID) | ORAL | Status: DC
  Administered 2014-02-25: 17 g via ORAL

## 2014-02-25 MED ORDER — DEXAMETHASONE SODIUM PHOSPHATE 10 MG/ML IJ SOLN
10.0000 mg | Freq: Once | INTRAMUSCULAR | Status: DC
  Filled 2014-02-25: qty 1

## 2014-02-25 MED ORDER — PROPOFOL INFUSION 10 MG/ML OPTIME
INTRAVENOUS | Status: DC | PRN
  Administered 2014-02-25: 100 ug/kg/min via INTRAVENOUS

## 2014-02-25 MED ORDER — KETOROLAC TROMETHAMINE 30 MG/ML IJ SOLN
INTRAMUSCULAR | Status: DC | PRN
Start: 2014-02-25 — End: 2014-02-25
  Administered 2014-02-25: 30 mg via INTRAVENOUS

## 2014-02-25 MED ORDER — MIDAZOLAM HCL 2 MG/2ML IJ SOLN
INTRAMUSCULAR | Status: AC
  Filled 2014-02-25: qty 2

## 2014-02-25 MED ORDER — POTASSIUM CHLORIDE 2 MEQ/ML IV SOLN
INTRAVENOUS | Status: DC
  Administered 2014-02-25: 20:00:00 via INTRAVENOUS
  Filled 2014-02-25 (×8): qty 1000

## 2014-02-25 MED ORDER — ALLOPURINOL 300 MG PO TABS
300.0000 mg | ORAL_TABLET | Freq: Every day | ORAL | Status: DC
  Administered 2014-02-25: 300 mg via ORAL
  Filled 2014-02-25 (×2): qty 1

## 2014-02-25 MED ORDER — LORATADINE 10 MG PO TABS
10.0000 mg | ORAL_TABLET | Freq: Every day | ORAL | Status: DC
  Administered 2014-02-25: 10 mg via ORAL
  Filled 2014-02-25 (×2): qty 1

## 2014-02-25 MED ORDER — DIPHENHYDRAMINE HCL 25 MG PO CAPS
25.0000 mg | ORAL_CAPSULE | Freq: Four times a day (QID) | ORAL | Status: DC | PRN
  Administered 2014-02-26: 25 mg via ORAL
  Filled 2014-02-25: qty 1

## 2014-02-25 MED ORDER — MENTHOL 3 MG MT LOZG
1.0000 | LOZENGE | OROMUCOSAL | Status: DC | PRN
  Filled 2014-02-25: qty 9

## 2014-02-25 MED ORDER — BUPIVACAINE-EPINEPHRINE (PF) 0.25% -1:200000 IJ SOLN
INTRAMUSCULAR | Status: AC
  Filled 2014-02-25: qty 30

## 2014-02-25 MED ORDER — TAMSULOSIN HCL 0.4 MG PO CAPS
0.4000 mg | ORAL_CAPSULE | Freq: Every day | ORAL | Status: DC
  Administered 2014-02-26: 0.4 mg via ORAL
  Filled 2014-02-25: qty 1

## 2014-02-25 SURGICAL SUPPLY — 56 items
ADH SKN CLS APL DERMABOND .7 (GAUZE/BANDAGES/DRESSINGS) ×1
BAG SPEC THK2 15X12 ZIP CLS (MISCELLANEOUS)
BAG ZIPLOCK 12X15 (MISCELLANEOUS) IMPLANT
BANDAGE ELASTIC 6 VELCRO ST LF (GAUZE/BANDAGES/DRESSINGS) ×3 IMPLANT
BANDAGE ESMARK 6X9 LF (GAUZE/BANDAGES/DRESSINGS) ×1 IMPLANT
BLADE SAW SGTL 13.0X1.19X90.0M (BLADE) ×3 IMPLANT
BNDG CMPR 9X6 STRL LF SNTH (GAUZE/BANDAGES/DRESSINGS) ×1
BNDG ESMARK 6X9 LF (GAUZE/BANDAGES/DRESSINGS) ×3
BONE CEMENT GENTAMICIN (Cement) ×6 IMPLANT
BOWL SMART MIX CTS (DISPOSABLE) ×3 IMPLANT
CAPT RP KNEE ×2 IMPLANT
CEMENT BONE GENTAMICIN 40 (Cement) IMPLANT
CUFF TOURN SGL QUICK 34 (TOURNIQUET CUFF) ×3
CUFF TRNQT CYL 34X4X40X1 (TOURNIQUET CUFF) ×1 IMPLANT
DECANTER SPIKE VIAL GLASS SM (MISCELLANEOUS) ×3 IMPLANT
DERMABOND ADVANCED (GAUZE/BANDAGES/DRESSINGS) ×2
DERMABOND ADVANCED .7 DNX12 (GAUZE/BANDAGES/DRESSINGS) IMPLANT
DRAPE EXTREMITY T 121X128X90 (DRAPE) ×3 IMPLANT
DRAPE POUCH INSTRU U-SHP 10X18 (DRAPES) ×3 IMPLANT
DRAPE U-SHAPE 47X51 STRL (DRAPES) ×3 IMPLANT
DRSG AQUACEL AG ADV 3.5X10 (GAUZE/BANDAGES/DRESSINGS) ×3 IMPLANT
DURAPREP 26ML APPLICATOR (WOUND CARE) ×6 IMPLANT
ELECT REM PT RETURN 9FT ADLT (ELECTROSURGICAL) ×3
ELECTRODE REM PT RTRN 9FT ADLT (ELECTROSURGICAL) ×1 IMPLANT
FACESHIELD WRAPAROUND (MASK) ×15 IMPLANT
FACESHIELD WRAPAROUND OR TEAM (MASK) ×5 IMPLANT
GLOVE BIOGEL PI IND STRL 7.5 (GLOVE) ×1 IMPLANT
GLOVE BIOGEL PI IND STRL 8.5 (GLOVE) ×1 IMPLANT
GLOVE BIOGEL PI INDICATOR 7.5 (GLOVE) ×2
GLOVE BIOGEL PI INDICATOR 8.5 (GLOVE) ×2
GLOVE ECLIPSE 8.0 STRL XLNG CF (GLOVE) ×3 IMPLANT
GLOVE ORTHO TXT STRL SZ7.5 (GLOVE) ×6 IMPLANT
GOWN SPEC L3 XXLG W/TWL (GOWN DISPOSABLE) ×3 IMPLANT
GOWN STRL REUS W/TWL LRG LVL3 (GOWN DISPOSABLE) ×3 IMPLANT
HANDPIECE INTERPULSE COAX TIP (DISPOSABLE) ×3
KIT BASIN OR (CUSTOM PROCEDURE TRAY) ×3 IMPLANT
LIQUID BAND (GAUZE/BANDAGES/DRESSINGS) ×3 IMPLANT
MANIFOLD NEPTUNE II (INSTRUMENTS) ×3 IMPLANT
NDL SAFETY ECLIPSE 18X1.5 (NEEDLE) ×1 IMPLANT
NEEDLE HYPO 18GX1.5 SHARP (NEEDLE) ×3
PACK TOTAL JOINT (CUSTOM PROCEDURE TRAY) ×3 IMPLANT
POSITIONER SURGICAL ARM (MISCELLANEOUS) ×3 IMPLANT
SET HNDPC FAN SPRY TIP SCT (DISPOSABLE) ×1 IMPLANT
SET PAD KNEE POSITIONER (MISCELLANEOUS) ×3 IMPLANT
SUCTION FRAZIER 12FR DISP (SUCTIONS) ×3 IMPLANT
SUT MNCRL AB 4-0 PS2 18 (SUTURE) ×3 IMPLANT
SUT VIC AB 1 CT1 36 (SUTURE) ×3 IMPLANT
SUT VIC AB 2-0 CT1 27 (SUTURE) ×9
SUT VIC AB 2-0 CT1 TAPERPNT 27 (SUTURE) ×3 IMPLANT
SUT VLOC 180 0 24IN GS25 (SUTURE) ×3 IMPLANT
SYR 50ML LL SCALE MARK (SYRINGE) ×3 IMPLANT
TOWEL OR 17X26 10 PK STRL BLUE (TOWEL DISPOSABLE) ×3 IMPLANT
TOWEL OR NON WOVEN STRL DISP B (DISPOSABLE) IMPLANT
TRAY FOLEY CATH 16FR SILVER (SET/KITS/TRAYS/PACK) ×2 IMPLANT
WATER STERILE IRR 1500ML POUR (IV SOLUTION) ×3 IMPLANT
WRAP KNEE MAXI GEL POST OP (GAUZE/BANDAGES/DRESSINGS) ×3 IMPLANT

## 2014-02-25 NOTE — Anesthesia Procedure Notes (Signed)
Spinal Patient location during procedure: OR Start time: 02/25/2014 3:00 PM End time: 02/25/2014 3:05 PM Staffing Performed by: anesthesiologist  Preanesthetic Checklist Completed: patient identified, site marked, surgical consent, pre-op evaluation, timeout performed, IV checked, risks and benefits discussed and monitors and equipment checked Spinal Block Patient position: sitting Prep: Betadine Patient monitoring: heart rate, continuous pulse ox and blood pressure Approach: right paramedian Location: L3-4 Injection technique: single-shot Needle Needle type: [place]  Needle gauge: 25 G Needle length: 9 cm Assessment Sensory level: T6 Additional Notes Expiration date of kit checked and confirmed. Patient tolerated procedure well, without complications. X 1 attempt with noted clear CSF return, noted loss of motor and sensory on exam.

## 2014-02-25 NOTE — Op Note (Signed)
NAME:  Frederick Keller                      MEDICAL RECORD NO.:  [id number]                             FACILITY:  [facility]      PHYSICIAN:  Pietro Cassis. Alvan Dame, M.D.  DATE OF BIRTH:  27-Feb-1956      DATE OF PROCEDURE:  02/25/2014                                     OPERATIVE REPORT         PREOPERATIVE DIAGNOSIS:  Left knee primary osteoarthritis.      POSTOPERATIVE DIAGNOSIS:  Left knee primary osteoarthritis.      FINDINGS:  The patient was noted to have complete loss of cartilage and   bone-on-bone arthritis with associated osteophytes in the medial and patellofemoral compartments of   the knee with a significant synovitis and associated effusion.      PROCEDURE:  Left total knee replacement.      COMPONENTS USED:  DePuy rotating platform posterior stabilized knee   system, a size 4 standard femur, 5 tibia, 15 mm PS insert, and 41 patellar   button.      SURGEON:  Pietro Cassis. Alvan Dame, M.D.      ASSISTANT:  Danae Orleans, PA-C.      ANESTHESIA:  Spinal.      SPECIMENS:  None.      COMPLICATION:  None.      DRAINS:  None.  EBL: <100      TOURNIQUET TIME:  94min at 234mmHg.      The patient was stable to the recovery room.      INDICATION FOR PROCEDURE:  Frederick Keller is a 58 y.o. male patient of   mine.  The patient had been seen, evaluated, and treated conservatively in the   office with medication, activity modification, and injections.  The patient had   radiographic changes of bone-on-bone arthritis with endplate sclerosis and osteophytes noted.      The patient failed conservative measures including medication, injections, and activity modification, and at this point was ready for more definitive measures.   Based on the radiographic changes and failed conservative measures, the patient   decided to proceed with total knee replacement.  Risks of infection,   DVT, component failure, need for revision surgery, postop course, and   expectations were all   discussed  and reviewed.  Consent was obtained for benefit of pain   relief.      PROCEDURE IN DETAIL:  The patient was brought to the operative theater.   Once adequate anesthesia, preoperative antibiotics, 900mg  of Cleocin administered, the patient was positioned supine with the left thigh tourniquet placed.  The  left lower extremity was prepped and draped in sterile fashion.  A time-   out was performed identifying the patient, planned procedure, and   extremity.      The left lower extremity was placed in the [facility] leg holder.  The leg was   exsanguinated, tourniquet elevated to 250 mmHg.  A midline incision was   made followed by median parapatellar arthrotomy.  Following initial   exposure, attention was first directed to the patella.  Precut   measurement was noted to be  27 mm.  I resected down to 16 mm and used a   41 patellar button to restore patellar height as well as cover the cut   surface.      The lug holes were drilled and a metal shim was placed to protect the   patella from retractors and saw blades.      At this point, attention was now directed to the femur.  The femoral   canal was opened with a drill, irrigated to try to prevent fat emboli.  An   intramedullary rod was passed at 5 degrees valgus, 10 mm of bone was   resected off the distal femur.  Following this resection, the tibia was   subluxated anteriorly.  Using the extramedullary guide, 2 mm of bone was resected off   the proximal medial tibia.  We confirmed the gap would be   stable medially and laterally with a 10 mm insert as well as confirmed   the cut was perpendicular in the coronal plane, checking with an alignment rod.      Once this was done, I sized the femur to be a size 4 in the anterior-   posterior dimension, chose a standard component based on medial and   lateral dimension.  The size 4 rotation block was then pinned in   position anterior referenced using the C-clamp to set rotation.  The    anterior, posterior, and  chamfer cuts were made without difficulty nor   notching making certain that I was along the anterior cortex to help   with flexion gap stability.      The final box cut was made off the lateral aspect of distal femur.      At this point, the tibia was sized to be a size 5, the size 5 tray was   then pinned in position through the medial third of the tubercle,   drilled, and keel punched.  Trial reduction was now carried with a 4 femur,  5 tibia, a 12.5 then 15 mm insert, and the 41 patella botton.  The knee was brought to   extension, full extension with good flexion stability with the patella   tracking through the trochlea without application of pressure.  Given   all these findings, the trial components removed.  Final components were   opened and cement was mixed.  The knee was irrigated with normal saline   solution and pulse lavage.  The synovial lining was   then injected with 30cc of 0.25% Marcaine with epinephrine and 1 cc of Toradol plus 30cc of NS for a  total of 61 cc.      The knee was irrigated.  Final implants were then cemented onto clean and   dried cut surfaces of bone with the knee brought to extension with a 15 mm trial insert.      Once the cement had fully cured, the excess cement was removed   throughout the knee.  I confirmed I was satisfied with the range of   motion and stability, and the final 15 mm PS insert was chosen.  It was   placed into the knee.      The tourniquet had been let down at 36 minutes.  No significant   hemostasis required.  The   extensor mechanism was then reapproximated using #1 Vicryl and #0 V-lock sutures with the knee   in flexion.  The   remaining wound was closed with 2-0 Vicryl and  running 4-0 Monocryl.   The knee was cleaned, dried, dressed sterilely using Dermabond and   Aquacel dressing.  The patient was then   brought to recovery room in stable condition, tolerating the procedure   well.   Please  note that Physician Assistant, Danae Orleans, PA-C, was present for the entirety of the case, and was utilized for pre-operative positioning, peri-operative retractor management, general facilitation of the procedure.  He was also utilized for primary wound closure at the end of the case.              Pietro Cassis Alvan Dame, M.D.    02/25/2014 3:10 PM

## 2014-02-25 NOTE — Interval H&P Note (Signed)
History and Physical Interval Note:  02/25/2014 1:58 PM  Frederick Keller  has presented today for surgery, with the diagnosis of OA LEFT KNEE   The various methods of treatment have been discussed with the patient and family. After consideration of risks, benefits and other options for treatment, the patient has consented to  Procedure(s): LEFT TOTAL KNEE ARTHROPLASTY (Left) as a surgical intervention .  The patient's history has been reviewed, patient examined, no change in status, stable for surgery.  I have reviewed the patient's chart and labs.  Questions were answered to the patient's satisfaction.     Mauri Pole

## 2014-02-25 NOTE — Anesthesia Preprocedure Evaluation (Addendum)
Anesthesia Evaluation  Patient identified by MRN, date of birth, ID band Patient awake    Reviewed: Allergy & Precautions, H&P , NPO status , Patient's Chart, lab work & pertinent test results  Airway Mallampati: II  TM Distance: >3 FB Neck ROM: Full    Dental no notable dental hx.    Pulmonary former smoker,  breath sounds clear to auscultation  Pulmonary exam normal       Cardiovascular negative cardio ROS  Rhythm:Regular Rate:Normal     Neuro/Psych H/O right foot neuropathy. negative psych ROS   GI/Hepatic negative GI ROS, Neg liver ROS,   Endo/Other  diabetes, Type 2  Renal/GU negative Renal ROS  negative genitourinary   Musculoskeletal  (+) Arthritis -,   Abdominal (+) + obese,   Peds negative pediatric ROS (+)  Hematology negative hematology ROS (+)   Anesthesia Other Findings   Reproductive/Obstetrics negative OB ROS                            Anesthesia Physical Anesthesia Plan  ASA: II  Anesthesia Plan: Spinal   Post-op Pain Management:    Induction: Intravenous  Airway Management Planned:   Additional Equipment:   Intra-op Plan:   Post-operative Plan: Extubation in OR  Informed Consent: I have reviewed the patients History and Physical, chart, labs and discussed the procedure including the risks, benefits and alternatives for the proposed anesthesia with the patient or authorized representative who has indicated his/her understanding and acceptance.   Dental advisory given  Plan Discussed with: CRNA  Anesthesia Plan Comments: (Discussed spinal and general. No history of excessive bleeding after any surgery. He prefers spinal. Discussed risks/benefits of spinal including headache, backache, failure, bleeding, hematoma,  infection, and nerve damage. Patient consents to spinal. Questions answered. Coagulation studies and platelet count acceptable.)        Anesthesia Quick Evaluation

## 2014-02-25 NOTE — Transfer of Care (Addendum)
Immediate Anesthesia Transfer of Care Note  Patient: Frederick Keller  Procedure(s) Performed: Procedure(s): LEFT TOTAL KNEE ARTHROPLASTY (Left)  Patient Location: PACU  Anesthesia Type:Spinal  Level of Consciousness: awake, alert , oriented and patient cooperative  Airway & Oxygen Therapy: Patient Spontanous Breathing and Patient connected to face mask oxygen  Post-op Assessment: Report given to PACU RN and Post -op Vital signs reviewed and stable  Post vital signs: stable  Complications: No apparent anesthesia complications  L3 spinal level

## 2014-02-25 NOTE — Anesthesia Postprocedure Evaluation (Signed)
  Anesthesia Post-op Note  Patient: Frederick Keller  Procedure(s) Performed: Procedure(s) (LRB): LEFT TOTAL KNEE ARTHROPLASTY (Left)  Patient Location: PACU  Anesthesia Type: Spinal  Level of Consciousness: awake and alert   Airway and Oxygen Therapy: Patient Spontanous Breathing  Post-op Pain: mild  Post-op Assessment: Post-op Vital signs reviewed, Patient's Cardiovascular Status Stable, Respiratory Function Stable, Patent Airway and No signs of Nausea or vomiting  Last Vitals:  Filed Vitals:   02/25/14 2000  BP: 101/70  Pulse: 67  Temp: 36.6 C  Resp: 16    Post-op Vital Signs: stable   Complications: No apparent anesthesia complications

## 2014-02-26 ENCOUNTER — Encounter (HOSPITAL_COMMUNITY): Payer: Self-pay | Admitting: Orthopedic Surgery

## 2014-02-26 DIAGNOSIS — E668 Other obesity: Secondary | ICD-10-CM | POA: Diagnosis present

## 2014-02-26 LAB — BASIC METABOLIC PANEL
ANION GAP: 8 (ref 5–15)
BUN: 17 mg/dL (ref 6–23)
CALCIUM: 9.2 mg/dL (ref 8.4–10.5)
CO2: 29 meq/L (ref 19–32)
CREATININE: 1.04 mg/dL (ref 0.50–1.35)
Chloride: 99 mEq/L (ref 96–112)
GFR calc Af Amer: 89 mL/min — ABNORMAL LOW (ref >90)
GFR calc non Af Amer: 77 mL/min — ABNORMAL LOW (ref >90)
Glucose, Bld: 182 mg/dL — ABNORMAL HIGH (ref 70–99)
Potassium: 5.4 mEq/L — ABNORMAL HIGH (ref 3.7–5.3)
Sodium: 136 mEq/L — ABNORMAL LOW (ref 137–147)

## 2014-02-26 LAB — CBC
HEMATOCRIT: 38.3 % — AB (ref 39.0–52.0)
Hemoglobin: 12.8 g/dL — ABNORMAL LOW (ref 13.0–17.0)
MCH: 32.2 pg (ref 26.0–34.0)
MCHC: 33.4 g/dL (ref 30.0–36.0)
MCV: 96.2 fL (ref 78.0–100.0)
PLATELETS: 153 10*3/uL (ref 150–400)
RBC: 3.98 MIL/uL — AB (ref 4.22–5.81)
RDW: 12.8 % (ref 11.5–15.5)
WBC: 12.2 10*3/uL — ABNORMAL HIGH (ref 4.0–10.5)

## 2014-02-26 MED ORDER — POLYETHYLENE GLYCOL 3350 17 G PO PACK: 17.0000 g | PACK | Freq: Two times a day (BID) | ORAL | Status: DC

## 2014-02-26 MED ORDER — ASPIRIN 325 MG PO TBEC: 325.0000 mg | DELAYED_RELEASE_TABLET | Freq: Two times a day (BID) | ORAL | Status: AC

## 2014-02-26 MED ORDER — DSS 100 MG PO CAPS: 100.0000 mg | ORAL_CAPSULE | Freq: Two times a day (BID) | ORAL | Status: DC

## 2014-02-26 MED ORDER — HYDROCODONE-ACETAMINOPHEN 7.5-325 MG PO TABS: 1.0000 | ORAL_TABLET | ORAL | Status: DC | PRN

## 2014-02-26 MED ORDER — ZOLPIDEM TARTRATE 5 MG PO TABS: 5.0000 mg | ORAL_TABLET | Freq: Every evening | ORAL | Status: DC | PRN

## 2014-02-26 MED ORDER — METHOCARBAMOL 500 MG PO TABS: 500.0000 mg | ORAL_TABLET | Freq: Four times a day (QID) | ORAL | Status: DC | PRN

## 2014-02-26 MED ORDER — FERROUS SULFATE 325 (65 FE) MG PO TABS: 325.0000 mg | ORAL_TABLET | Freq: Three times a day (TID) | ORAL | Status: DC

## 2014-02-26 NOTE — Progress Notes (Signed)
Physical Therapy Treatment Patient Details Name: Frederick Keller MRN: [id number] DOB: December 05, 1955 Today's Date: 02/26/2014    History of Present Illness L TKR    PT Comments    Pt progressing well and eager for d/c.  Reviewed home therex and stairs.  Follow Up Recommendations  Home health PT     Equipment Recommendations  None recommended by PT    Recommendations for Other Services OT consult     Precautions / Restrictions Precautions Precautions: Knee;Fall Restrictions Weight Bearing Restrictions: No Other Position/Activity Restrictions: WBAT    Mobility  Bed Mobility Overal bed mobility: Needs Assistance Bed Mobility: Supine to Sit     Supine to sit: Supervision     General bed mobility comments: cues for sequence and use of R LE to self assist  Transfers Overall transfer level: Needs assistance Equipment used: Rolling walker (2 wheeled) Transfers: Sit to/from Stand Sit to Stand: Supervision         General transfer comment: cues for LE managment and use of UEs to self assist  Ambulation/Gait Ambulation/Gait assistance: Min guard;Supervision Ambulation Distance (Feet): 100 Feet Assistive device: Rolling walker (2 wheeled) Gait Pattern/deviations: Step-to pattern;Decreased step length - right;Decreased step length - left;Shuffle     General Gait Details: cues for sequence, posture and position from RW   Stairs Stairs: Yes Stairs assistance: Min assist Stair Management: No rails;Step to pattern;Backwards;With walker Number of Stairs: 1 (twice) General stair comments: cues for sequence and foot/RW placement  Wheelchair Mobility    Modified Rankin (Stroke Patients Only)       Balance                                    Cognition Arousal/Alertness: Awake/alert Behavior During Therapy: WFL for tasks assessed/performed Overall Cognitive Status: Within Functional Limits for tasks assessed                       Exercises Total Joint Exercises Ankle Circles/Pumps: AROM;Both;10 reps;Supine Quad Sets: AROM;Both;10 reps;Supine Heel Slides: AAROM;Left;10 reps;Supine Straight Leg Raises: AAROM;AROM;Left;15 reps;Supine    General Comments        Pertinent Vitals/Pain Pain Assessment: 0-10 Pain Score: 4  Pain Location: L knee Pain Descriptors / Indicators: Aching;Sore Pain Intervention(s): Limited activity within patient's tolerance;Monitored during session;Premedicated before session;Ice applied    Home Living                      Prior Function            PT Goals (current goals can now be found in the care plan section) Acute Rehab PT Goals Patient Stated Goal: Resume previous lifestyle with decreased pain PT Goal Formulation: With patient Time For Goal Achievement: 03/01/14 Potential to Achieve Goals: Good Progress towards PT goals: Progressing toward goals    Frequency  7X/week    PT Plan Current plan remains appropriate    Co-evaluation             End of Session Equipment Utilized During Treatment: Gait belt Activity Tolerance: Patient tolerated treatment well Patient left: in chair;with call bell/phone within reach;with family/visitor present     Time: 1355-1425 PT Time Calculation (min) (ACUTE ONLY): 30 min  Charges:  $Gait Training: 8-22 mins $Therapeutic Exercise: 8-22 mins                    G Codes:  Delitha Elms 02/26/2014, 5:44 PM

## 2014-02-26 NOTE — Progress Notes (Signed)
Discharged from floor via w/c, family with pt. No changes in assessment. Raford Brissett  

## 2014-02-26 NOTE — Plan of Care (Signed)
Problem: Discharge Progression Outcomes Goal: Barriers To Progression Addressed/Resolved Outcome: Not Applicable Date Met:  45/99/77 Goal: CMS/Neurovascular status at or above baseline Outcome: Completed/Met Date Met:  02/26/14 Goal: Anticoagulant follow-up in place Outcome: Completed/Met Date Met:  02/26/14 Goal: Pain controlled with appropriate interventions Outcome: Completed/Met Date Met:  02/26/14 Goal: Hemodynamically stable Outcome: Completed/Met Date Met:  41/42/39 Goal: Complications resolved/controlled Outcome: Completed/Met Date Met:  02/26/14 Goal: Tolerates diet Outcome: Completed/Met Date Met:  02/26/14 Goal: Activity appropriate for discharge plan Outcome: Completed/Met Date Met:  02/26/14 Goal: Ambulates safely using assistive device Outcome: Completed/Met Date Met:  02/26/14 Goal: Follows weight - bearing limitations Outcome: Not Applicable Date Met:  53/20/23 Goal: Discharge plan in place and appropriate Outcome: Completed/Met Date Met:  02/26/14 Goal: Negotiates stairs Outcome: Completed/Met Date Met:  02/26/14 Goal: Demonstrates ADLs as appropriate Outcome: Completed/Met Date Met:  02/26/14 Goal: Incision without S/S infection Outcome: Adequate for Discharge

## 2014-02-26 NOTE — Care Management Note (Signed)
    Page 1 of 2   02/26/2014     12:21:53 PM CARE MANAGEMENT NOTE 02/26/2014  Patient:  Frederick Keller, Frederick Keller   Account Number:  [id number]  Date Initiated:  02/26/2014  Documentation initiated by:  [facility]  Subjective/Objective Assessment:   adm: LEFT TOTAL KNEE ARTHROPLASTY (Left)     Action/Plan:   discharge planning   Anticipated DC Date:  02/26/2014   Anticipated DC Plan:  [place]  CM consult      [facility] Choice  HOME HEALTH   Choice offered to / List presented to:  C-1 Patient   DME arranged  3-N-1  Vassie Moselle      DME agency  Ray arranged  [place]   Status of service:  Completed, signed off Medicare Important Message given?   (If response is "NO", the following Medicare IM given date fields will be blank) Date Medicare IM given:   Medicare IM given by:   Date Additional Medicare IM given:   Additional Medicare IM given by:    Discharge Disposition:  [place]  Per UR Regulation:  Reviewed for med. necessity/level of care/duration of stay  If discussed at [place] of Stay Meetings, dates discussed:    Comments:  02/26/14 09:15 CM met with pt in room to offer choice for home health agency.  Pt chooses [facility] to render HHPT. Address and contact information verified with pt.  DME rep, Pura Spice to deliver rolling walker and 3n1 to room prior to discharge.  Referral called to [place], Mordecai Rasmussen.  No other CM needs were communicated.  Mariane Masters, BSN, CM [phone].

## 2014-02-26 NOTE — Progress Notes (Signed)
OT Cancellation Note  Patient Details Name: Frederick Keller MRN: [id number] DOB: 10-03-1955   Cancelled Treatment:    Reason Eval/Treat Not Completed: OT screened, no needs identified, will sign off  Darlina Rumpf Daven[place], OTR/L [phone]  02/26/2014, 1:05 PM

## 2014-02-26 NOTE — Progress Notes (Signed)
     Subjective: 1 Day Post-Op Procedure(s) (LRB): LEFT TOTAL KNEE ARTHROPLASTY (Left)   Patient reports pain as mild, pain controlled.  Actually not having much pain at all. Didn't sleep well through the night, but otherwise no events.  Ready to be discharged home.  Objective:   VITALS:   Filed Vitals:   02/26/14 0619  BP: 96/63  Pulse: 73  Temp: 97.8 F (36.6 C)  Resp: 16    Dorsiflexion/Plantar flexion intact Incision: dressing C/D/I No cellulitis present Compartment soft  LABS  Recent Labs  02/26/14 0410  HGB 12.8*  HCT 38.3*  WBC 12.2*  PLT 153     Recent Labs  02/26/14 0410  NA 136*  K 5.4*  BUN 17  CREATININE 1.04  GLUCOSE 182*     Assessment/Plan: 1 Day Post-Op Procedure(s) (LRB): LEFT TOTAL KNEE ARTHROPLASTY (Left) Foley cath d/c'ed Advance diet Up with therapy D/C IV fluids Discharge home with home health  Follow up in 2 weeks at [facility]. Follow up with OLIN,Ioannis Schuh D in 2 weeks.  Contact information:  [facility] [address], [place] [phone]    Obese (BMI 30-39.9) Estimated body mass index is 34.72 kg/(m^2) as calculated from the following:   Height as of this encounter: 5\' 10"  (1.778 m).   Weight as of this encounter: 109.77 kg (242 lb). Patient also counseled that weight may inhibit the healing process Patient counseled that losing weight will help with future health issues        West Pugh. Aydden Cumpian   PAC  02/26/2014, 9:41 AM

## 2014-02-26 NOTE — Plan of Care (Signed)
Problem: Phase II Progression Outcomes Goal: Ambulates Outcome: Completed/Met Date Met:  02/26/14 Goal: Tolerating diet Outcome: Completed/Met Date Met:  02/26/14 Goal: Discharge plan established Outcome: Completed/Met Date Met:  02/26/14

## 2014-02-26 NOTE — Evaluation (Signed)
Physical Therapy Evaluation Patient Details Name: Frederick Keller MRN: [id number] DOB: 1955-10-27 Today's Date: 02/26/2014   History of Present Illness  L TKR  Clinical Impression  Pt s/p L TKR presents with decreased L LE strength/ROM and post op pain limiting functional mobility.  Pt should progress well to d/c home with family assist and HHPT follow up.    Follow Up Recommendations Home health PT    Equipment Recommendations  None recommended by PT    Recommendations for Other Services OT consult     Precautions / Restrictions Precautions Precautions: Knee;Fall Restrictions Weight Bearing Restrictions: No Other Position/Activity Restrictions: WBAT      Mobility  Bed Mobility Overal bed mobility: Needs Assistance Bed Mobility: Supine to Sit     Supine to sit: Min guard     General bed mobility comments: cues for sequence and use of R LE to self assist  Transfers Overall transfer level: Needs assistance Equipment used: Rolling walker (2 wheeled) Transfers: Sit to/from Stand Sit to Stand: Min guard         General transfer comment: cues for LE managment and use of UEs to self assist  Ambulation/Gait Ambulation/Gait assistance: Min assist;Min guard Ambulation Distance (Feet): 120 Feet Assistive device: Rolling walker (2 wheeled) Gait Pattern/deviations: Step-to pattern;Decreased step length - right;Decreased step length - left;Shuffle;Trunk flexed     General Gait Details: cues for sequence, posture and position from [employer]            Wheelchair Mobility    Modified Rankin (Stroke Patients Only)       Balance                                             Pertinent Vitals/Pain Pain Assessment: 0-10 Pain Score: 4  Pain Location: L knee Pain Descriptors / Indicators: Aching;Sore Pain Intervention(s): Limited activity within patient's tolerance;Monitored during session;Premedicated before session;Ice applied    Home  Living Family/patient expects to be discharged to:: Private residence Living Arrangements: Spouse/significant other Available Help at Discharge: Family Type of Home: House Home Access: Stairs to enter Entrance Stairs-Rails: None Technical brewer of Steps: 1 Home Layout: One level Home Equipment: None Additional Comments: RW and BSC has been delivered to room    Prior Function Level of Independence: Independent               Hand Dominance   Dominant Hand: Right    Extremity/Trunk Assessment   Upper Extremity Assessment: Overall WFL for tasks assessed           Lower Extremity Assessment: LLE deficits/detail   LLE Deficits / Details: 3/5 quads with AAROM at knee -10 - 85  Cervical / Trunk Assessment: Normal  Communication   Communication: No difficulties  Cognition Arousal/Alertness: Awake/alert Behavior During Therapy: WFL for tasks assessed/performed Overall Cognitive Status: Within Functional Limits for tasks assessed                      General Comments      Exercises Total Joint Exercises Ankle Circles/Pumps: AROM;Both;10 reps;Supine Quad Sets: AROM;Both;10 reps;Supine Heel Slides: AAROM;Left;10 reps;Supine Straight Leg Raises: AAROM;AROM;Left;15 reps;Supine      Assessment/Plan    PT Assessment Patient needs continued PT services  PT Diagnosis Difficulty walking   PT Problem List Decreased strength;Decreased range of motion;Decreased activity tolerance;Decreased mobility;Decreased knowledge of use  of DME;Pain  PT Treatment Interventions DME instruction;Gait training;Stair training;Functional mobility training;Therapeutic activities;Therapeutic exercise;Patient/family education   PT Goals (Current goals can be found in the Care Plan section) Acute Rehab PT Goals Patient Stated Goal: Resume previous lifestyle with decreased pain PT Goal Formulation: With patient Time For Goal Achievement: 03/01/14 Potential to Achieve Goals:  Good    Frequency 7X/week   Barriers to discharge        Co-evaluation               End of Session Equipment Utilized During Treatment: Gait belt Activity Tolerance: Patient tolerated treatment well Patient left: in chair;with call bell/phone within reach;with family/visitor present Nurse Communication: Mobility status         Time: 7505-1833 PT Time Calculation (min) (ACUTE ONLY): 33 min   Charges:   PT Evaluation $Initial PT Evaluation Tier I: 1 Procedure PT Treatments $Gait Training: 8-22 mins $Therapeutic Exercise: 8-22 mins   PT G Codes:          Frederick Keller 02/26/2014, 12:18 PM

## 2014-02-27 DIAGNOSIS — E785 Hyperlipidemia, unspecified: Secondary | ICD-10-CM | POA: Diagnosis not present

## 2014-02-27 DIAGNOSIS — E119 Type 2 diabetes mellitus without complications: Secondary | ICD-10-CM | POA: Diagnosis not present

## 2014-02-27 DIAGNOSIS — Z96652 Presence of left artificial knee joint: Secondary | ICD-10-CM | POA: Diagnosis not present

## 2014-02-27 DIAGNOSIS — Z87891 Personal history of nicotine dependence: Secondary | ICD-10-CM | POA: Diagnosis not present

## 2014-02-27 DIAGNOSIS — Z471 Aftercare following joint replacement surgery: Secondary | ICD-10-CM | POA: Diagnosis not present

## 2014-02-27 DIAGNOSIS — M1000 Idiopathic gout, unspecified site: Secondary | ICD-10-CM | POA: Diagnosis not present

## 2014-02-28 ENCOUNTER — Encounter (HOSPITAL_COMMUNITY): Payer: Self-pay | Admitting: Orthopedic Surgery

## 2014-03-03 DIAGNOSIS — Z471 Aftercare following joint replacement surgery: Secondary | ICD-10-CM | POA: Diagnosis not present

## 2014-03-03 DIAGNOSIS — Z96652 Presence of left artificial knee joint: Secondary | ICD-10-CM | POA: Diagnosis not present

## 2014-03-03 DIAGNOSIS — E785 Hyperlipidemia, unspecified: Secondary | ICD-10-CM | POA: Diagnosis not present

## 2014-03-03 DIAGNOSIS — Z87891 Personal history of nicotine dependence: Secondary | ICD-10-CM | POA: Diagnosis not present

## 2014-03-03 DIAGNOSIS — E119 Type 2 diabetes mellitus without complications: Secondary | ICD-10-CM | POA: Diagnosis not present

## 2014-03-03 DIAGNOSIS — M1000 Idiopathic gout, unspecified site: Secondary | ICD-10-CM | POA: Diagnosis not present

## 2014-03-03 NOTE — Discharge Summary (Signed)
Physician Discharge Summary  Patient ID: Frederick Keller MRN: [id number] DOB/AGE: 04-10-1955 58 y.o.  Admit date: 02/25/2014 Discharge date: 02/26/2014   Procedures:  Procedure(s) (LRB): LEFT TOTAL KNEE ARTHROPLASTY (Left)  Attending Physician:  Dr. Paralee Cancel   Admission Diagnoses:   Left knee primary OA / pain  Discharge Diagnoses:  Principal Problem:   S/P left TKA Active Problems:   Obese  Past Medical History  Diagnosis Date  . Gout   . High cholesterol   . Diabetes mellitus     type 2-diet controlled  . History of kidney stones   . Nodule of right lung     lower-being monitored  . Arthritis     HPI: Frederick Keller, 58 y.o. male, has a history of pain and functional disability in the left knee due to arthritis and has failed non-surgical conservative treatments for greater than 12 weeks to includeNSAID's and/or analgesics, corticosteriod injections, viscosupplementation injections and activity modification. Onset of symptoms was gradual, starting 2.5+ years ago with gradually worsening course since that time. The patient noted prior procedures on the knee to include arthroscopy on the left knee(s). Patient currently rates pain in the left knee(s) at 8 out of 10 with activity. Patient has night pain, worsening of pain with activity and weight bearing, pain that interferes with activities of daily living, pain with passive range of motion, crepitus and joint swelling. Patient has evidence of periarticular osteophytes and joint space narrowing by imaging studies. There is no active infection. Risks, benefits and expectations were discussed with the patient. Risks including but not limited to the risk of anesthesia, blood clots, nerve damage, blood vessel damage, failure of the prosthesis, infection and up to and including death. Patient understand the risks, benefits and expectations and wishes to proceed with surgery.   PCP: Irven Shelling, MD   Discharged  Condition: good  Hospital Course:  Patient underwent the above stated procedure on 02/25/2014. Patient tolerated the procedure well and brought to the recovery room in good condition and subsequently to the floor.  POD #1 BP: 96/63 ; Pulse: 73 ; Temp: 97.8 F (36.6 C) ; Resp: 16 Patient reports pain as mild, pain controlled. Actually not having much pain at all. Didn't sleep well through the night, but otherwise no events. Ready to be discharged home. Dorsiflexion/plantar flexion intact, incision: dressing C/D/I, no cellulitis present and compartment soft.   LABS  Basename    HGB  12.8  HCT  38.3     Discharge Exam: General appearance: alert, cooperative and no distress Extremities: Homans sign is negative, no sign of DVT, no edema, redness or tenderness in the calves or thighs and no ulcers, gangrene or trophic changes  Disposition: Home with follow up in 2 weeks   Follow-up Information    Follow up with Mauri Pole, MD. Schedule an appointment as soon as possible for a visit in 2 weeks.   Specialty:  Orthopedic Surgery   Contact information:   [address] [place] [address] [phone]       Follow up with [facility]   Why:  home health physical therapy    Contact information:   [place] [address] [phone]       Follow up with [place].   Why:  3n1 (commode) and rolling walker    Contact information:   [address] [place] [address] [phone]  Discharge Instructions    Call MD / Call 911    Complete by:  As directed   If you experience chest pain or shortness of breath, CALL 911 and be transported to the hospital emergency room.  If you develope a fever above 101 F, pus (white drainage) or increased drainage or redness at the wound, or calf pain, call your surgeon's office.     Change dressing    Complete by:  As directed   Maintain surgical dressing  for 10-14 days, or until follow up in the clinic.     Constipation Prevention    Complete by:  As directed   Drink plenty of fluids.  Prune juice may be helpful.  You may use a stool softener, such as Colace (over the counter) 100 mg twice a day.  Use MiraLax (over the counter) for constipation as needed.     Diet - low sodium heart healthy    Complete by:  As directed      Discharge instructions    Complete by:  As directed   Maintain surgical dressing for 10-14 days, or until follow up in the clinic. Follow up in 2 weeks at [facility]. Call with any questions or concerns.     Increase activity slowly as tolerated    Complete by:  As directed      TED hose    Complete by:  As directed   Use stockings (TED hose) for 2 weeks on both leg(s).  You may remove them at night for sleeping.     Weight bearing as tolerated    Complete by:  As directed   Laterality:  left  Extremity:  Lower             Medication List    STOP taking these medications        naproxen sodium 220 MG tablet  Commonly known as:  ANAPROX      TAKE these medications        allopurinol 300 MG tablet  Commonly known as:  ZYLOPRIM  Take 300 mg by mouth at bedtime.     aspirin 325 MG EC tablet  Take 1 tablet (325 mg total) by mouth 2 (two) times daily.     cholecalciferol 1000 UNITS tablet  Commonly known as:  VITAMIN D  Take 1,000 Units by mouth at bedtime.     cyanocobalamin 1000 MCG/ML injection  Commonly known as:  (VITAMIN B-12)  Inject 1,000 mcg into the muscle every 30 (thirty) days. 2nd Wednesday of the month.     DSS 100 MG Caps  Take 100 mg by mouth 2 (two) times daily.     ferrous sulfate 325 (65 FE) MG tablet  Take 1 tablet (325 mg total) by mouth 3 (three) times daily after meals.     HYDROcodone-acetaminophen 7.5-325 MG per tablet  Commonly known as:  NORCO  Take 1-2 tablets by mouth every 4 (four) hours as needed for moderate pain.     ketorolac 10 MG tablet    Commonly known as:  TORADOL  Take 10 mg by mouth at bedtime.     loratadine 10 MG tablet  Commonly known as:  CLARITIN  Take 10 mg by mouth at bedtime.     methocarbamol 500 MG tablet  Commonly known as:  ROBAXIN  Take 1 tablet (500 mg total) by mouth every 6 (six) hours as needed for muscle spasms.     polyethylene glycol packet  Commonly known  as:  MIRALAX / GLYCOLAX  Take 17 g by mouth 2 (two) times daily.     pravastatin 40 MG tablet  Commonly known as:  PRAVACHOL  Take 40 mg by mouth at bedtime.     promethazine 25 MG tablet  Commonly known as:  PHENERGAN  Take 25 mg by mouth every 4 (four) hours as needed for nausea or vomiting.     tamsulosin 0.4 MG Caps capsule  Commonly known as:  FLOMAX  Take 0.4 mg by mouth daily.     zolpidem 5 MG tablet  Commonly known as:  AMBIEN  Take 1 tablet (5 mg total) by mouth at bedtime as needed for sleep.         Signed: West Pugh. Madyson Lukach   PA-C  03/03/2014, 3:37 PM

## 2014-03-04 DIAGNOSIS — Z87891 Personal history of nicotine dependence: Secondary | ICD-10-CM | POA: Diagnosis not present

## 2014-03-04 DIAGNOSIS — Z471 Aftercare following joint replacement surgery: Secondary | ICD-10-CM | POA: Diagnosis not present

## 2014-03-04 DIAGNOSIS — Z96652 Presence of left artificial knee joint: Secondary | ICD-10-CM | POA: Diagnosis not present

## 2014-03-04 DIAGNOSIS — E785 Hyperlipidemia, unspecified: Secondary | ICD-10-CM | POA: Diagnosis not present

## 2014-03-04 DIAGNOSIS — M1000 Idiopathic gout, unspecified site: Secondary | ICD-10-CM | POA: Diagnosis not present

## 2014-03-04 DIAGNOSIS — E119 Type 2 diabetes mellitus without complications: Secondary | ICD-10-CM | POA: Diagnosis not present

## 2014-03-07 DIAGNOSIS — E119 Type 2 diabetes mellitus without complications: Secondary | ICD-10-CM | POA: Diagnosis not present

## 2014-03-07 DIAGNOSIS — E785 Hyperlipidemia, unspecified: Secondary | ICD-10-CM | POA: Diagnosis not present

## 2014-03-07 DIAGNOSIS — Z87891 Personal history of nicotine dependence: Secondary | ICD-10-CM | POA: Diagnosis not present

## 2014-03-07 DIAGNOSIS — Z96652 Presence of left artificial knee joint: Secondary | ICD-10-CM | POA: Diagnosis not present

## 2014-03-07 DIAGNOSIS — M1000 Idiopathic gout, unspecified site: Secondary | ICD-10-CM | POA: Diagnosis not present

## 2014-03-07 DIAGNOSIS — Z471 Aftercare following joint replacement surgery: Secondary | ICD-10-CM | POA: Diagnosis not present

## 2014-03-10 DIAGNOSIS — Z96652 Presence of left artificial knee joint: Secondary | ICD-10-CM | POA: Diagnosis not present

## 2014-03-10 DIAGNOSIS — M1000 Idiopathic gout, unspecified site: Secondary | ICD-10-CM | POA: Diagnosis not present

## 2014-03-10 DIAGNOSIS — Z471 Aftercare following joint replacement surgery: Secondary | ICD-10-CM | POA: Diagnosis not present

## 2014-03-10 DIAGNOSIS — E119 Type 2 diabetes mellitus without complications: Secondary | ICD-10-CM | POA: Diagnosis not present

## 2014-03-10 DIAGNOSIS — E785 Hyperlipidemia, unspecified: Secondary | ICD-10-CM | POA: Diagnosis not present

## 2014-03-10 DIAGNOSIS — Z87891 Personal history of nicotine dependence: Secondary | ICD-10-CM | POA: Diagnosis not present

## 2014-03-12 DIAGNOSIS — E785 Hyperlipidemia, unspecified: Secondary | ICD-10-CM | POA: Diagnosis not present

## 2014-03-12 DIAGNOSIS — Z87891 Personal history of nicotine dependence: Secondary | ICD-10-CM | POA: Diagnosis not present

## 2014-03-12 DIAGNOSIS — Z96652 Presence of left artificial knee joint: Secondary | ICD-10-CM | POA: Diagnosis not present

## 2014-03-12 DIAGNOSIS — E119 Type 2 diabetes mellitus without complications: Secondary | ICD-10-CM | POA: Diagnosis not present

## 2014-03-12 DIAGNOSIS — M1000 Idiopathic gout, unspecified site: Secondary | ICD-10-CM | POA: Diagnosis not present

## 2014-03-12 DIAGNOSIS — Z471 Aftercare following joint replacement surgery: Secondary | ICD-10-CM | POA: Diagnosis not present

## 2014-03-14 DIAGNOSIS — M1000 Idiopathic gout, unspecified site: Secondary | ICD-10-CM | POA: Diagnosis not present

## 2014-03-14 DIAGNOSIS — Z87891 Personal history of nicotine dependence: Secondary | ICD-10-CM | POA: Diagnosis not present

## 2014-03-14 DIAGNOSIS — Z471 Aftercare following joint replacement surgery: Secondary | ICD-10-CM | POA: Diagnosis not present

## 2014-03-14 DIAGNOSIS — E119 Type 2 diabetes mellitus without complications: Secondary | ICD-10-CM | POA: Diagnosis not present

## 2014-03-14 DIAGNOSIS — E785 Hyperlipidemia, unspecified: Secondary | ICD-10-CM | POA: Diagnosis not present

## 2014-03-14 DIAGNOSIS — Z96652 Presence of left artificial knee joint: Secondary | ICD-10-CM | POA: Diagnosis not present

## 2014-03-17 DIAGNOSIS — M1000 Idiopathic gout, unspecified site: Secondary | ICD-10-CM | POA: Diagnosis not present

## 2014-03-17 DIAGNOSIS — Z471 Aftercare following joint replacement surgery: Secondary | ICD-10-CM | POA: Diagnosis not present

## 2014-03-17 DIAGNOSIS — E785 Hyperlipidemia, unspecified: Secondary | ICD-10-CM | POA: Diagnosis not present

## 2014-03-17 DIAGNOSIS — E119 Type 2 diabetes mellitus without complications: Secondary | ICD-10-CM | POA: Diagnosis not present

## 2014-03-17 DIAGNOSIS — Z87891 Personal history of nicotine dependence: Secondary | ICD-10-CM | POA: Diagnosis not present

## 2014-03-17 DIAGNOSIS — Z96652 Presence of left artificial knee joint: Secondary | ICD-10-CM | POA: Diagnosis not present

## 2014-03-18 DIAGNOSIS — M1712 Unilateral primary osteoarthritis, left knee: Secondary | ICD-10-CM | POA: Diagnosis not present

## 2014-03-20 DIAGNOSIS — M1712 Unilateral primary osteoarthritis, left knee: Secondary | ICD-10-CM | POA: Diagnosis not present

## 2014-03-21 DIAGNOSIS — M549 Dorsalgia, unspecified: Secondary | ICD-10-CM | POA: Diagnosis not present

## 2014-03-21 DIAGNOSIS — N200 Calculus of kidney: Secondary | ICD-10-CM | POA: Diagnosis not present

## 2014-03-21 DIAGNOSIS — R109 Unspecified abdominal pain: Secondary | ICD-10-CM | POA: Diagnosis not present

## 2014-03-24 DIAGNOSIS — M1712 Unilateral primary osteoarthritis, left knee: Secondary | ICD-10-CM | POA: Diagnosis not present

## 2014-03-26 DIAGNOSIS — M1712 Unilateral primary osteoarthritis, left knee: Secondary | ICD-10-CM | POA: Diagnosis not present

## 2014-03-31 DIAGNOSIS — M1712 Unilateral primary osteoarthritis, left knee: Secondary | ICD-10-CM | POA: Diagnosis not present

## 2014-04-02 DIAGNOSIS — M1712 Unilateral primary osteoarthritis, left knee: Secondary | ICD-10-CM | POA: Diagnosis not present

## 2014-04-03 DIAGNOSIS — Z96652 Presence of left artificial knee joint: Secondary | ICD-10-CM | POA: Diagnosis not present

## 2014-04-03 DIAGNOSIS — Z471 Aftercare following joint replacement surgery: Secondary | ICD-10-CM | POA: Diagnosis not present

## 2014-06-29 ENCOUNTER — Emergency Department (HOSPITAL_COMMUNITY): Payer: Medicare Other

## 2014-06-29 ENCOUNTER — Inpatient Hospital Stay (HOSPITAL_COMMUNITY)
Admission: EM | Admit: 2014-06-29 | Discharge: 2014-07-01 | DRG: 287 | Disposition: A | Discharge status: Home / Self Care | Payer: Medicare Other | Attending: Family Medicine | Admitting: Family Medicine

## 2014-06-29 ENCOUNTER — Encounter (HOSPITAL_COMMUNITY): Payer: Self-pay | Admitting: *Deleted

## 2014-06-29 DIAGNOSIS — E780 Pure hypercholesterolemia: Secondary | ICD-10-CM | POA: Diagnosis present

## 2014-06-29 DIAGNOSIS — I200 Unstable angina: Secondary | ICD-10-CM | POA: Diagnosis present

## 2014-06-29 DIAGNOSIS — Z888 Allergy status to other drugs, medicaments and biological substances status: Secondary | ICD-10-CM | POA: Diagnosis not present

## 2014-06-29 DIAGNOSIS — E669 Obesity, unspecified: Secondary | ICD-10-CM | POA: Diagnosis present

## 2014-06-29 DIAGNOSIS — I10 Essential (primary) hypertension: Secondary | ICD-10-CM | POA: Diagnosis present

## 2014-06-29 DIAGNOSIS — M109 Gout, unspecified: Secondary | ICD-10-CM | POA: Diagnosis present

## 2014-06-29 DIAGNOSIS — R079 Chest pain, unspecified: Secondary | ICD-10-CM | POA: Insufficient documentation

## 2014-06-29 DIAGNOSIS — Z87891 Personal history of nicotine dependence: Secondary | ICD-10-CM | POA: Diagnosis not present

## 2014-06-29 DIAGNOSIS — Z96652 Presence of left artificial knee joint: Secondary | ICD-10-CM

## 2014-06-29 DIAGNOSIS — I471 Supraventricular tachycardia, unspecified: Secondary | ICD-10-CM | POA: Diagnosis not present

## 2014-06-29 DIAGNOSIS — Z7982 Long term (current) use of aspirin: Secondary | ICD-10-CM

## 2014-06-29 DIAGNOSIS — Z880 Allergy status to penicillin: Secondary | ICD-10-CM

## 2014-06-29 DIAGNOSIS — M1990 Unspecified osteoarthritis, unspecified site: Secondary | ICD-10-CM | POA: Diagnosis present

## 2014-06-29 DIAGNOSIS — E668 Other obesity: Secondary | ICD-10-CM | POA: Diagnosis present

## 2014-06-29 DIAGNOSIS — I429 Cardiomyopathy, unspecified: Secondary | ICD-10-CM | POA: Diagnosis present

## 2014-06-29 DIAGNOSIS — E785 Hyperlipidemia, unspecified: Secondary | ICD-10-CM | POA: Diagnosis present

## 2014-06-29 DIAGNOSIS — Z96653 Presence of artificial knee joint, bilateral: Secondary | ICD-10-CM | POA: Diagnosis present

## 2014-06-29 DIAGNOSIS — Z6833 Body mass index (BMI) 33.0-33.9, adult: Secondary | ICD-10-CM | POA: Diagnosis not present

## 2014-06-29 DIAGNOSIS — IMO0002 Reserved for concepts with insufficient information to code with codable children: Secondary | ICD-10-CM | POA: Diagnosis present

## 2014-06-29 DIAGNOSIS — E1165 Type 2 diabetes mellitus with hyperglycemia: Secondary | ICD-10-CM | POA: Diagnosis not present

## 2014-06-29 LAB — CBC
HCT: 43.5 % (ref 39.0–52.0)
HEMOGLOBIN: 14.8 g/dL (ref 13.0–17.0)
MCH: 32.4 pg (ref 26.0–34.0)
MCHC: 34 g/dL (ref 30.0–36.0)
MCV: 95.2 fL (ref 78.0–100.0)
Platelets: 167 10*3/uL (ref 150–400)
RBC: 4.57 MIL/uL (ref 4.22–5.81)
RDW: 13.5 % (ref 11.5–15.5)
WBC: 10.4 10*3/uL (ref 4.0–10.5)

## 2014-06-29 LAB — GLUCOSE, CAPILLARY
GLUCOSE-CAPILLARY: 117 mg/dL — AB (ref 70–99)
Glucose-Capillary: 105 mg/dL — ABNORMAL HIGH (ref 70–99)
Glucose-Capillary: 98 mg/dL (ref 70–99)
Glucose-Capillary: 95 mg/dL (ref 70–99)
Glucose-Capillary: 140 mg/dL — ABNORMAL HIGH (ref 70–99)

## 2014-06-29 LAB — BASIC METABOLIC PANEL
Anion gap: 6 (ref 5–15)
BUN: 10 mg/dL (ref 6–23)
CALCIUM: 9 mg/dL (ref 8.4–10.5)
CHLORIDE: 99 mmol/L (ref 96–112)
CO2: 31 mmol/L (ref 19–32)
CREATININE: 0.98 mg/dL (ref 0.50–1.35)
GFR calc Af Amer: >90 mL/min (ref >90)
GFR calc non Af Amer: 88 mL/min — ABNORMAL LOW (ref >90)
GLUCOSE: 239 mg/dL — AB (ref 70–99)
Potassium: 4.6 mmol/L (ref 3.5–5.1)
SODIUM: 136 mmol/L (ref 135–145)

## 2014-06-29 LAB — LIPID PANEL
Cholesterol: 148 mg/dL (ref 0–200)
HDL: 33 mg/dL — ABNORMAL LOW (ref >39)
LDL CALC: 88 mg/dL (ref 0–99)
Total CHOL/HDL Ratio: 4.5 RATIO
Triglycerides: 136 mg/dL (ref <150)
VLDL: 27 mg/dL (ref 0–40)

## 2014-06-29 LAB — HEPARIN LEVEL (UNFRACTIONATED)
HEPARIN UNFRACTIONATED: 0.39 [IU]/mL (ref 0.30–0.70)
Heparin Unfractionated: 0.16 IU/mL — ABNORMAL LOW (ref 0.30–0.70)

## 2014-06-29 LAB — COMPREHENSIVE METABOLIC PANEL
ALK PHOS: 85 U/L (ref 39–117)
ALT: 16 U/L (ref 0–53)
ANION GAP: 10 (ref 5–15)
AST: 22 U/L (ref 0–37)
Albumin: 3.6 g/dL (ref 3.5–5.2)
BUN: 9 mg/dL (ref 6–23)
CO2: 25 mmol/L (ref 19–32)
CREATININE: 0.91 mg/dL (ref 0.50–1.35)
Calcium: 8.8 mg/dL (ref 8.4–10.5)
Chloride: 100 mmol/L (ref 96–112)
GFR calc Af Amer: >90 mL/min (ref >90)
Glucose, Bld: 124 mg/dL — ABNORMAL HIGH (ref 70–99)
Potassium: 4.3 mmol/L (ref 3.5–5.1)
Sodium: 135 mmol/L (ref 135–145)
Total Bilirubin: 0.5 mg/dL (ref 0.3–1.2)
Total Protein: 6.6 g/dL (ref 6.0–8.3)

## 2014-06-29 LAB — I-STAT TROPONIN, ED: Troponin i, poc: 0 ng/mL (ref 0.00–0.08)

## 2014-06-29 LAB — TROPONIN I
TROPONIN I: 0.03 ng/mL (ref <0.031)
Troponin I: <0.03 ng/mL (ref <0.031)
Troponin I: <0.03 ng/mL (ref <0.031)

## 2014-06-29 LAB — BRAIN NATRIURETIC PEPTIDE: B NATRIURETIC PEPTIDE 5: 58.9 pg/mL (ref 0.0–100.0)

## 2014-06-29 MED ORDER — SODIUM CHLORIDE 0.9 % IJ SOLN
3.0000 mL | Freq: Two times a day (BID) | INTRAMUSCULAR | Status: DC
  Administered 2014-06-29 – 2014-06-30 (×3): 3 mL via INTRAVENOUS

## 2014-06-29 MED ORDER — INSULIN ASPART 100 UNIT/ML ~~LOC~~ SOLN
0.0000 [IU] | Freq: Three times a day (TID) | SUBCUTANEOUS | Status: DC
  Administered 2014-06-29 – 2014-07-01 (×2): 2 [IU] via SUBCUTANEOUS

## 2014-06-29 MED ORDER — HEPARIN (PORCINE) IN NACL 100-0.45 UNIT/ML-% IJ SOLN
1500.0000 [IU]/h | INTRAMUSCULAR | Status: DC
  Administered 2014-06-29: 1500 [IU]/h via INTRAVENOUS
  Administered 2014-06-29: 1250 [IU]/h via INTRAVENOUS
  Filled 2014-06-29 (×2): qty 250

## 2014-06-29 MED ORDER — MORPHINE SULFATE 2 MG/ML IJ SOLN
2.0000 mg | INTRAMUSCULAR | Status: DC | PRN
  Administered 2014-06-29: 2 mg via INTRAVENOUS
  Filled 2014-06-29: qty 1

## 2014-06-29 MED ORDER — HEPARIN BOLUS VIA INFUSION
4000.0000 [IU] | Freq: Once | INTRAVENOUS | Status: AC
  Administered 2014-06-29: 4000 [IU] via INTRAVENOUS
  Filled 2014-06-29: qty 4000

## 2014-06-29 MED ORDER — ZOLPIDEM TARTRATE 5 MG PO TABS
5.0000 mg | ORAL_TABLET | Freq: Every evening | ORAL | Status: DC | PRN
  Administered 2014-06-29 – 2014-06-30 (×2): 5 mg via ORAL
  Filled 2014-06-29 (×2): qty 1

## 2014-06-29 MED ORDER — SODIUM CHLORIDE 0.9 % IV SOLN
1.0000 mL/kg/h | INTRAVENOUS | Status: DC
  Administered 2014-06-30: 1 mL/kg/h via INTRAVENOUS

## 2014-06-29 MED ORDER — ASPIRIN 81 MG PO CHEW: 81.0000 mg | CHEWABLE_TABLET | Freq: Every day | ORAL | Status: DC

## 2014-06-29 MED ORDER — ASPIRIN 81 MG PO CHEW
81.0000 mg | CHEWABLE_TABLET | ORAL | Status: AC
  Administered 2014-06-30: 81 mg via ORAL
  Filled 2014-06-29: qty 1

## 2014-06-29 MED ORDER — HEPARIN SODIUM (PORCINE) 5000 UNIT/ML IJ SOLN
5000.0000 [IU] | Freq: Three times a day (TID) | INTRAMUSCULAR | Status: DC
  Administered 2014-06-29: 5000 [IU] via SUBCUTANEOUS
  Filled 2014-06-29: qty 1

## 2014-06-29 MED ORDER — ALLOPURINOL 300 MG PO TABS
300.0000 mg | ORAL_TABLET | Freq: Every day | ORAL | Status: DC
  Administered 2014-06-29 – 2014-06-30 (×2): 300 mg via ORAL
  Filled 2014-06-29 (×2): qty 1

## 2014-06-29 MED ORDER — NITROGLYCERIN 0.4 MG SL SUBL: 0.4000 mg | SUBLINGUAL_TABLET | SUBLINGUAL | Status: DC | PRN

## 2014-06-29 MED ORDER — PANTOPRAZOLE SODIUM 40 MG PO TBEC
40.0000 mg | DELAYED_RELEASE_TABLET | Freq: Every day | ORAL | Status: DC
  Administered 2014-06-29 – 2014-07-01 (×3): 40 mg via ORAL
  Filled 2014-06-29 (×3): qty 1

## 2014-06-29 MED ORDER — HEPARIN BOLUS VIA INFUSION
2000.0000 [IU] | Freq: Once | INTRAVENOUS | Status: AC
  Administered 2014-06-29: 2000 [IU] via INTRAVENOUS
  Filled 2014-06-29: qty 2000

## 2014-06-29 MED ORDER — PNEUMOCOCCAL VAC POLYVALENT 25 MCG/0.5ML IJ INJ
0.5000 mL | INJECTION | INTRAMUSCULAR | Status: DC
  Filled 2014-06-29 (×2): qty 0.5

## 2014-06-29 MED ORDER — ACETAMINOPHEN 325 MG PO TABS: 650.0000 mg | ORAL_TABLET | Freq: Four times a day (QID) | ORAL | Status: DC | PRN

## 2014-06-29 MED ORDER — ACETAMINOPHEN 650 MG RE SUPP: 650.0000 mg | Freq: Four times a day (QID) | RECTAL | Status: DC | PRN

## 2014-06-29 MED ORDER — ASPIRIN 325 MG PO TABS
325.0000 mg | ORAL_TABLET | Freq: Once | ORAL | Status: AC
  Administered 2014-06-29: 325 mg via ORAL
  Filled 2014-06-29: qty 1

## 2014-06-29 MED ORDER — INSULIN ASPART 100 UNIT/ML ~~LOC~~ SOLN: 0.0000 [IU] | Freq: Every day | SUBCUTANEOUS | Status: DC

## 2014-06-29 MED ORDER — PRAMIPEXOLE DIHYDROCHLORIDE 0.25 MG PO TABS
0.5000 mg | ORAL_TABLET | Freq: Every evening | ORAL | Status: DC | PRN
  Administered 2014-06-29 – 2014-06-30 (×2): 0.5 mg via ORAL
  Filled 2014-06-29 (×4): qty 2

## 2014-06-29 NOTE — ED Notes (Signed)
approx 2300 the pt felt like he had  Something standing on his chest followed by n v diaphoresis.  The chest pain continues and he is very dizzy.  No previous history

## 2014-06-29 NOTE — Progress Notes (Addendum)
ANTICOAGULATION CONSULT NOTE - Follow Up Consult  Pharmacy Consult for heparin Indication: chest pain/ACS  Allergies  Allergen Reactions  . Dilaudid [Hydromorphone Hcl] Nausea And Vomiting  . Penicillins Hives    Patient Measurements: Height: 5\' 10"  (177.8 cm) Weight: 232 lb 14.4 oz (105.643 kg) IBW/kg (Calculated) : 73 Heparin Dosing Weight: 95 kg  Vital Signs: Temp: 98.3 F (36.8 C) (03/27 0600) Temp Source: Oral (03/27 0600)  Labs:  Recent Labs  06/29/14 0219 06/29/14 0649 06/29/14 1155 06/29/14 1540  HGB  --  14.8  --   --   HCT  --  43.5  --   --   PLT  --  167  --   --   HEPARINUNFRC  --   --   --  0.16*  CREATININE 0.98 0.91  --   --   TROPONINI  --  <0.03 <0.03  --     Estimated Creatinine Clearance: 106.3 mL/min (by C-G formula based on Cr of 0.91).   Medications:  Scheduled:  . allopurinol  300 mg Oral Daily  . [START ON 06/30/2014] aspirin  81 mg Oral Daily  . insulin aspart  0-15 Units Subcutaneous TID WC  . insulin aspart  0-5 Units Subcutaneous QHS  . pantoprazole  40 mg Oral Daily  . [START ON 06/30/2014] pneumococcal 23 valent vaccine  0.5 mL Intramuscular Tomorrow-1000  . sodium chloride  3 mL Intravenous Q12H   Infusions:  . heparin 1,250 Units/hr (06/29/14 1053)    Assessment: 59 yo male with ACS is currently on subtherapeutic heparin.  Heparin level is 0.16. No problem with infusion per RN Goal of Therapy:  Heparin level 0.3-0.7 units/ml Monitor platelets by anticoagulation protocol: Yes   Plan:  - Heparin 2000 units bolus x1, then increase heparin to 1500 units/hr - 6hr heparin level  Deberah Adolf, Tsz-Yin 06/29/2014,5:18 PM

## 2014-06-29 NOTE — Progress Notes (Signed)
Subjective:  Patient admitted this morning, please see detailed H&P by Dr. Wyline Copas.  Patient came with chest pain, diaphoresis with radiation to left arm. He also has a history of diabetes mellitus, diet-controlled, hyperlipidemia, strong family history of heart disease. Currently patient is chest pain-free Filed Vitals:   06/29/14 0600  BP:   Pulse:   Temp: 98.3 F (36.8 C)  Resp:     Chest: Clear Bilaterally Heart : S1S2 RRR Abdomen: Soft, nontender Ext : No edema Neuro: Alert, oriented x 3  A/P  Chest pain Patient presented with atypical chest pain, strong family history, diabetes mellitus, hyperlipidemia. Will consult cardiology for possible stress test. Cardiac enzymes are negative so far.  Diabetes mellitus Sliding-scale insulin  [place] Hospitalist [phone]

## 2014-06-29 NOTE — ED Notes (Signed)
Patient is still having chest pain.  Admitting physician made aware that Neuro tele unit would not accept patient.  Waiting for new bed assignment.

## 2014-06-29 NOTE — ED Notes (Signed)
Placed pt on 2 liters of oxygen (nasal cannula) due to low O2 saturations

## 2014-06-29 NOTE — H&P (Signed)
[facility] History and Physical  Frederick Keller ZOX:[id number] DOB: Jun 15, 1955 DOA: 06/29/2014  Referring physician: Emergency Department PCP: Irven Shelling, MD  Specialists:   Chief Complaint: Chest pain  HPI: Frederick Keller is a 59 y.o. male  With a hx of HTN, "diet controlled" DM, arthritis who presents to the ED with chest pain that woke pt up on the evening of admit. The patient endorsed feelings of nausea, diaphoresis, since resolved in the ED. Pt is currently symptom free. Given presenting chest pain, hospitalist was consulted for admission.  On further questioning, pt reports eating shrimp dinner earlier in the evening. At around 11pm, pt noted substernal chest pains with recurrent bouts of n/v and sob.  Review of Systems:  Review of Systems  Constitutional: Positive for diaphoresis. Negative for fever and chills.  HENT: Negative for ear discharge and sore throat.   Eyes: Negative for double vision and discharge.  Respiratory: Negative for cough and wheezing.   Cardiovascular: Positive for chest pain. Negative for palpitations.  Gastrointestinal: Positive for nausea and vomiting. Negative for diarrhea.  Genitourinary: Negative for frequency and flank pain.  Musculoskeletal: Negative for myalgias and falls.  Neurological: Negative for tremors, seizures and loss of consciousness.  Psychiatric/Behavioral: Negative for depression and memory loss.     Past Medical History  Diagnosis Date  . Gout   . High cholesterol   . Diabetes mellitus     type 2-diet controlled  . History of kidney stones   . Nodule of right lung     lower-being monitored  . Arthritis    Past Surgical History  Procedure Laterality Date  . Joint replacement      rt knee-2008  . Lithotripsy    . Tonsillectomy  age 34  . Nose surgery  1960    from horse accident  . Nasal sinus surgery  1972    reconstruction with tonsils  . Nasal septum surgery  1997  . Knee arthroscopy Bilateral      x2 right, x1 left  . Ankle surgery Right 1970  . Total knee arthroplasty Left 02/25/2014    Procedure: LEFT TOTAL KNEE ARTHROPLASTY;  Surgeon: Mauri Pole, MD;  Location: [facility];  Service: Orthopedics;  Laterality: Left;   Social History:  reports that he quit smoking about 18 years ago. His smoking use included Cigarettes. He quit after 30 years of use. He has never used smokeless tobacco. He reports that he does not drink alcohol or use illicit drugs.  where does patient live--home, ALF, SNF? and with whom if at home?  Can patient participate in ADLs?  Allergies  Allergen Reactions  . Dilaudid [Hydromorphone Hcl] Nausea And Vomiting  . Penicillins Hives    No family history on file. father with CAD (be sure to complete)  Prior to Admission medications   Medication Sig Start Date End Date Taking? Authorizing Provider  allopurinol (ZYLOPRIM) 300 MG tablet Take 300 mg by mouth at bedtime.     Historical Provider, MD  cholecalciferol (VITAMIN D) 1000 UNITS tablet Take 1,000 Units by mouth at bedtime.     Historical Provider, MD  cyanocobalamin (,VITAMIN B-12,) 1000 MCG/ML injection Inject 1,000 mcg into the muscle every 30 (thirty) days. 2nd Wednesday of the month.    Historical Provider, MD  docusate sodium 100 MG CAPS Take 100 mg by mouth 2 (two) times daily. 02/26/14   Danae Orleans, PA-C  ferrous sulfate 325 (65 FE) MG tablet Take 1 tablet (325 mg total)  by mouth 3 (three) times daily after meals. 02/26/14   Danae Orleans, PA-C  HYDROcodone-acetaminophen (NORCO) 7.5-325 MG per tablet Take 1-2 tablets by mouth every 4 (four) hours as needed for moderate pain. 02/26/14   Danae Orleans, PA-C  ketorolac (TORADOL) 10 MG tablet Take 10 mg by mouth at bedtime.     Historical Provider, MD  loratadine (CLARITIN) 10 MG tablet Take 10 mg by mouth at bedtime.     Historical Provider, MD  methocarbamol (ROBAXIN) 500 MG tablet Take 1 tablet (500 mg total) by mouth every 6 (six) hours as  needed for muscle spasms. 02/26/14   Danae Orleans, PA-C  polyethylene glycol (MIRALAX / GLYCOLAX) packet Take 17 g by mouth 2 (two) times daily. 02/26/14   Danae Orleans, PA-C  pravastatin (PRAVACHOL) 40 MG tablet Take 40 mg by mouth at bedtime.     Historical Provider, MD  promethazine (PHENERGAN) 25 MG tablet Take 25 mg by mouth every 4 (four) hours as needed for nausea or vomiting.    Historical Provider, MD  tamsulosin (FLOMAX) 0.4 MG CAPS capsule Take 0.4 mg by mouth daily.    Historical Provider, MD  zolpidem (AMBIEN) 5 MG tablet Take 1 tablet (5 mg total) by mouth at bedtime as needed for sleep. 02/26/14   Danae Orleans, PA-C   Physical Exam: Filed Vitals:   06/29/14 0130 06/29/14 0145 06/29/14 0200 06/29/14 0215  BP: 118/77 124/80 122/75 136/87  Pulse: 73 85 91 87  Temp: 97.5 F (36.4 C)     TempSrc: Oral     Resp: 18   17  SpO2: 95% 96% 94% 97%     General:  Awake, in nad  Eyes: PERRL B  ENT: membranes moist, dentition fair  Neck: trachea midline, neck supple  Cardiovascular: regular, s1, s2  Respiratory: normal resp effort, no wheezing  Abdomen: soft,nondistended  Skin: normal skin turgor, no abnormal skin lesions seen  Musculoskeletal: perfused, no clubbing  Psychiatric: mood/affect normal// no auditory/visual hallucinations  Neurologic: cn2-12 grossly intact, strength/sensation intact  Labs on Admission:  Basic Metabolic Panel:  Recent Labs Lab 06/29/14 0219  NA 136  K 4.6  CL 99  CO2 31  GLUCOSE 239*  BUN 10  CREATININE 0.98  CALCIUM 9.0   Liver Function Tests: No results for input(s): AST, ALT, ALKPHOS, BILITOT, PROT, ALBUMIN in the last 168 hours. No results for input(s): LIPASE, AMYLASE in the last 168 hours. No results for input(s): AMMONIA in the last 168 hours. CBC: No results for input(s): WBC, NEUTROABS, HGB, HCT, MCV, PLT in the last 168 hours. Cardiac Enzymes: No results for input(s): CKTOTAL, CKMB, CKMBINDEX, TROPONINI in the  last 168 hours.  BNP (last 3 results)  Recent Labs  06/29/14 0220  BNP 58.9    ProBNP (last 3 results) No results for input(s): PROBNP in the last 8760 hours.  CBG: No results for input(s): GLUCAP in the last 168 hours.  Radiological Exams on Admission: Dg Chest 2 View  06/29/2014   CLINICAL DATA:  Chest pain tonight with dizziness and shortness of breath.  EXAM: CHEST  2 VIEW  COMPARISON:  02/18/2014  FINDINGS: The cardiomediastinal contours are normal. Incidental note of an azygos fissure. Minimal atelectasis in the right middle lobe. Pulmonary vasculature is normal. No consolidation, pleural effusion, or pneumothorax. No acute osseous abnormalities are seen.  IMPRESSION: Minimal atelectasis in the right middle lobe. Otherwise no acute pulmonary process.   Electronically Signed   By: Fonnie Birkenhead.D.  On: 06/29/2014 02:38    EKG: Independently reviewed. NSL  Assessment/Plan Principal Problem:   Chest pain Active Problems:   S/P left TKA   Obese   Diabetes mellitus type 2, uncontrolled   1. Chest pain 1. Currently symptom free 2.  Will follow serial enzymes 3. Will obtain 2d echo to r/o WMA 4. Cont analgesics and NTG as needed 5. Will add PPI 6. Admit to med-tele, obs 2. DM2 1. Reportedly "diet controlled" although presenting glucose is over 230 - not controlled 2. Cont on SSI coverage 3. Depending on glucose trends, may consider metformin on d/c. Renal function is normal 3. Obesity 1. Stable 4. S/p L TKA 1. Stable 5. DVT prophylaxis 1. Heparin subQ   Code Status: Full (must indicate code status--if unknown or must be presumed, indicate so) Family Communication: Pt in room, wife at bedside (indicate person spoken with, if applicable, with phone number if by telephone) Disposition Plan: admit to med-tele (indicate anticipated LOS)  Joscelin Fray K [facility] Pager [phone]  If 7PM-7AM, please contact night-coverage [email or web address] Password  [facility] 06/29/2014, 3:51 AM

## 2014-06-29 NOTE — Progress Notes (Signed)
ANTICOAGULATION CONSULT NOTE - Initial Consult  Pharmacy Consult for Heparin Indication: chest pain/ACS  Allergies  Allergen Reactions  . Dilaudid [Hydromorphone Hcl] Nausea And Vomiting  . Penicillins Hives    Patient Measurements: Height: 5\' 10"  (177.8 cm) Weight: 232 lb 14.4 oz (105.643 kg) IBW/kg (Calculated) : 73 Heparin Dosing Weight: 95 kg  Vital Signs: Temp: 98.3 F (36.8 C) (03/27 0600) Temp Source: Oral (03/27 0600) BP: 112/65 mmHg (03/27 0507) Pulse Rate: 82 (03/27 0507)  Labs:  Recent Labs  06/29/14 0219 06/29/14 0649  HGB  --  14.8  HCT  --  43.5  PLT  --  167  CREATININE 0.98 0.91  TROPONINI  --  <0.03    Estimated Creatinine Clearance: 106.3 mL/min (by C-G formula based on Cr of 0.91).   Medical History: Past Medical History  Diagnosis Date  . Gout   . High cholesterol   . Diabetes mellitus     type 2-diet controlled  . History of kidney stones   . Nodule of right lung     lower-being monitored  . Arthritis     Medications:  Scheduled:  . insulin aspart  0-15 Units Subcutaneous TID WC  . insulin aspart  0-5 Units Subcutaneous QHS  . pantoprazole  40 mg Oral Daily  . [START ON 06/30/2014] pneumococcal 23 valent vaccine  0.5 mL Intramuscular Tomorrow-1000  . sodium chloride  3 mL Intravenous Q12H    Assessment: 59 yo M admitted on 06/29/2014 with CP. Initial trop negative. Pharmacy consulted to initiate heparin. CBC wnl with no reported s/s bleeding. Renal function wnl.   Goal of Therapy:  Heparin level 0.3-0.7 units/ml Monitor platelets by anticoagulation protocol: Yes   Plan:  - Initiate heparin 4000 units x 1 bolus, followed by 1250 units/hr - 6hr HL - Daily HL/CBC - Monitor for s/s bleeding - F/u plans for cath on Monday  Aarika Moon K. Velva Harman, PharmD, [place] Clinical Pharmacist - Resident Pager: [phone] Pharmacy: [phone] 06/29/2014 10:19 AM

## 2014-06-29 NOTE — ED Provider Notes (Signed)
CSN: [id number]     Arrival date & time 06/29/14  0121 History  This chart was scribed for Everlene Balls, MD by Eustaquio Maize, ED Scribe. This patient was seen in room B15C/B15C and the patient's care was started at 3:16 AM.    Chief Complaint  Patient presents with  . Chest Pain   The history is provided by the patient. No language interpreter was used.     HPI Comments: Frederick Keller is a 59 y.o. male with PMHx HLD and DM who presents to the Emergency Department complaining of chest pain that began last night around 11 PM (approximately 4 hours ago). Pt states that he was laying in his bed trying to fall asleep when the pain came on suddenly. He describes it as a pressure. Pt also complains of shortness of breath, nausea, vomiting, diaphoresis, and generalized weakness. He reports bilateral knee surgery and states he was standing up when he got so weak, and had to sit down. Pt denies fever, chills, painful leg swelling, or any other symptoms. He denies previous DVT or PE.   Past Medical History  Diagnosis Date  . Gout   . High cholesterol   . Diabetes mellitus     type 2-diet controlled  . History of kidney stones   . Nodule of right lung     lower-being monitored  . Arthritis    Past Surgical History  Procedure Laterality Date  . Joint replacement      rt knee-2008  . Lithotripsy    . Tonsillectomy  age 40  . Nose surgery  1960    from horse accident  . Nasal sinus surgery  1972    reconstruction with tonsils  . Nasal septum surgery  1997  . Knee arthroscopy Bilateral     x2 right, x1 left  . Ankle surgery Right 1970  . Total knee arthroplasty Left 02/25/2014    Procedure: LEFT TOTAL KNEE ARTHROPLASTY;  Surgeon: Mauri Pole, MD;  Location: [facility];  Service: Orthopedics;  Laterality: Left;   No family history on file. History  Substance Use Topics  . Smoking status: Former Smoker -- 30 years    Types: Cigarettes    Quit date: 04/04/1996  . Smokeless tobacco: Never  Used  . Alcohol Use: No    Review of Systems  10 Systems reviewed and all are negative for acute change except as noted in the HPI.    Allergies  Dilaudid and Penicillins  Home Medications   Prior to Admission medications   Medication Sig Start Date End Date Taking? Authorizing Provider  allopurinol (ZYLOPRIM) 300 MG tablet Take 300 mg by mouth at bedtime.     Historical Provider, MD  cholecalciferol (VITAMIN D) 1000 UNITS tablet Take 1,000 Units by mouth at bedtime.     Historical Provider, MD  cyanocobalamin (,VITAMIN B-12,) 1000 MCG/ML injection Inject 1,000 mcg into the muscle every 30 (thirty) days. 2nd Wednesday of the month.    Historical Provider, MD  docusate sodium 100 MG CAPS Take 100 mg by mouth 2 (two) times daily. 02/26/14   Danae Orleans, PA-C  ferrous sulfate 325 (65 FE) MG tablet Take 1 tablet (325 mg total) by mouth 3 (three) times daily after meals. 02/26/14   Danae Orleans, PA-C  HYDROcodone-acetaminophen (NORCO) 7.5-325 MG per tablet Take 1-2 tablets by mouth every 4 (four) hours as needed for moderate pain. 02/26/14   Danae Orleans, PA-C  ketorolac (TORADOL) 10 MG tablet Take 10 mg  by mouth at bedtime.     Historical Provider, MD  loratadine (CLARITIN) 10 MG tablet Take 10 mg by mouth at bedtime.     Historical Provider, MD  methocarbamol (ROBAXIN) 500 MG tablet Take 1 tablet (500 mg total) by mouth every 6 (six) hours as needed for muscle spasms. 02/26/14   Danae Orleans, PA-C  polyethylene glycol (MIRALAX / GLYCOLAX) packet Take 17 g by mouth 2 (two) times daily. 02/26/14   Danae Orleans, PA-C  pravastatin (PRAVACHOL) 40 MG tablet Take 40 mg by mouth at bedtime.     Historical Provider, MD  promethazine (PHENERGAN) 25 MG tablet Take 25 mg by mouth every 4 (four) hours as needed for nausea or vomiting.    Historical Provider, MD  tamsulosin (FLOMAX) 0.4 MG CAPS capsule Take 0.4 mg by mouth daily.    Historical Provider, MD  zolpidem (AMBIEN) 5 MG tablet Take  1 tablet (5 mg total) by mouth at bedtime as needed for sleep. 02/26/14   Danae Orleans, PA-C   Triage Vitals: BP 118/77 mmHg  Pulse 73  Temp(Src) 97.5 F (36.4 C) (Oral)  Resp 18  SpO2 95%   Physical Exam  Constitutional: He is oriented to person, place, and time. Vital signs are normal. He appears well-developed and well-nourished.  Non-toxic appearance. He does not appear ill. No distress.  HENT:  Head: Normocephalic and atraumatic.  Nose: Nose normal.  Mouth/Throat: Oropharynx is clear and moist. No oropharyngeal exudate.  Eyes: Conjunctivae and EOM are normal. Pupils are equal, round, and reactive to light. No scleral icterus.  Neck: Normal range of motion. Neck supple. No tracheal deviation, no edema, no erythema and normal range of motion present. No thyroid mass and no thyromegaly present.  Cardiovascular: Normal rate, regular rhythm, S1 normal, S2 normal, normal heart sounds, intact distal pulses and normal pulses.  Exam reveals no gallop and no friction rub.   No murmur heard. Pulses:      Radial pulses are 2+ on the right side, and 2+ on the left side.       Dorsalis pedis pulses are 2+ on the right side, and 2+ on the left side.  Pulmonary/Chest: Effort normal and breath sounds normal. No respiratory distress. He has no wheezes. He has no rhonchi. He has no rales.  Abdominal: Soft. Normal appearance and bowel sounds are normal. He exhibits no distension, no ascites and no mass. There is no hepatosplenomegaly. There is no tenderness. There is no rebound, no guarding and no CVA tenderness.  Musculoskeletal: Normal range of motion. He exhibits no edema or tenderness.  Lymphadenopathy:    He has no cervical adenopathy.  Neurological: He is alert and oriented to person, place, and time. He has normal strength. No cranial nerve deficit or sensory deficit.  Skin: Skin is warm, dry and intact. No petechiae and no rash noted. He is not diaphoretic. No erythema. No pallor.   Psychiatric: He has a normal mood and affect. His behavior is normal. Judgment normal.  Nursing note and vitals reviewed.   ED Course  Procedures (including critical care time)  DIAGNOSTIC STUDIES: Oxygen Saturation is 95% on RA, normal by my interpretation.    COORDINATION OF CARE: 3:18 AM-Discussed treatment plan which includes admittance to the hospital and stress test with pt at bedside and pt agreed to plan.   Labs Review Labs Reviewed  BASIC METABOLIC PANEL - Abnormal; Notable for the following:    Glucose, Bld 239 (*)    GFR calc  non Af Amer 88 (*)    All other components within normal limits  BRAIN NATRIURETIC PEPTIDE  CBC WITH DIFFERENTIAL/PLATELET  Randolm Idol, ED    Imaging Review Dg Chest 2 View  06/29/2014   CLINICAL DATA:  Chest pain tonight with dizziness and shortness of breath.  EXAM: CHEST  2 VIEW  COMPARISON:  02/18/2014  FINDINGS: The cardiomediastinal contours are normal. Incidental note of an azygos fissure. Minimal atelectasis in the right middle lobe. Pulmonary vasculature is normal. No consolidation, pleural effusion, or pneumothorax. No acute osseous abnormalities are seen.  IMPRESSION: Minimal atelectasis in the right middle lobe. Otherwise no acute pulmonary process.   Electronically Signed   By: Jeb Levering M.D.   On: 06/29/2014 02:38     EKG Interpretation   Date/Time:  Sunday June 29 2014 01:26:26 EDT Ventricular Rate:  77 PR Interval:  156 QRS Duration: 80 QT Interval:  392 QTC Calculation: 443 R Axis:   16 Text Interpretation:  Normal sinus rhythm Nonspecific ST abnormality  Abnormal ECG No significant change since last tracing Confirmed by Glynn Octave [phone]) on 06/29/2014 1:40:17 AM      MDM   Final diagnoses:  None   patient since emergency department for chest pain. His story is concerning for ACS as he had pressure chest pain, dyspnea, emesis and diaphoresis. He has never had a cardiac workup in the past.  Patient will require admission for continued evaluation. I spoke with [place] hospitalist will admit the patient to telemetry unit. Aspirin was given in emergency department.   I personally performed the services described in this documentation, which was scribed in my presence. The recorded information has been reviewed and is accurate.      Everlene Balls, MD 06/29/14 [phone]

## 2014-06-29 NOTE — Progress Notes (Signed)
UR completed 

## 2014-06-29 NOTE — ED Notes (Signed)
Patient transported to X-ray 

## 2014-06-29 NOTE — Consult Note (Signed)
CONSULTATION NOTE  Reason for Consult: Chest pain  Requesting Physician: Dr. Darrick Meigs  Cardiologist: None (NEW)  HPI: This is a 59 y.o. male with a past medical history significant for hypertension, diet-controlled diabetes, arthritis and presents with chest pain that woke him up. He reported substernal chest discomfort with pain that radiated to his left arm including numbness and tingling. He's also had some nausea, diaphoresis and vomited twice. Symptoms are highly concerning for acute coronary syndrome. He does have a history of tobacco abuse in the past but quit almost 20 years ago, however he had about 30 pack years worth of smoking. Father has a history of coronary disease and apparently so does his mother. He currently reports his chest pain that is improved but not totally resolved. He is also reporting shortness of breath. He says that he has been fatigued with decreased exercise tolerance since his knee replacement in the fall. Initial troponins are negative. BNP is around 60. Total cholesterol 148, triglycerides 136, HDL 33 and LDL 88. Cardiology is asked to consult regarding chest pain.  PMHx:  Past Medical History  Diagnosis Date  . Gout   . High cholesterol   . Diabetes mellitus     type 2-diet controlled  . History of kidney stones   . Nodule of right lung     lower-being monitored  . Arthritis    Past Surgical History  Procedure Laterality Date  . Joint replacement      rt knee-2008  . Lithotripsy    . Tonsillectomy  age 65  . Nose surgery  1960    from horse accident  . Nasal sinus surgery  1972    reconstruction with tonsils  . Nasal septum surgery  1997  . Knee arthroscopy Bilateral     x2 right, x1 left  . Ankle surgery Right 1970  . Total knee arthroplasty Left 02/25/2014    Procedure: LEFT TOTAL KNEE ARTHROPLASTY;  Surgeon: Mauri Pole, MD;  Location: [facility];  Service: Orthopedics;  Laterality: Left;    FAMHx: Family History  Problem Relation  Age of Onset  . CAD Mother   . CAD Father     SOCHx:  reports that he quit smoking about 18 years ago. His smoking use included Cigarettes. He quit after 30 years of use. He has never used smokeless tobacco. He reports that he does not drink alcohol or use illicit drugs.  ALLERGIES: Allergies  Allergen Reactions  . Dilaudid [Hydromorphone Hcl] Nausea And Vomiting  . Penicillins Hives    ROS: A comprehensive review of systems was negative except for: Constitutional: positive for fatigue Cardiovascular: positive for chest pain and dyspnea Gastrointestinal: positive for nausea and vomiting  HOME MEDICATIONS: Prescriptions prior to admission  Medication Sig Dispense Refill Last Dose  . allopurinol (ZYLOPRIM) 300 MG tablet Take 300 mg by mouth at bedtime.    02/24/2014 at PM  . cholecalciferol (VITAMIN D) 1000 UNITS tablet Take 1,000 Units by mouth at bedtime.    02/17/2014 at Unknown time  . cyanocobalamin (,VITAMIN B-12,) 1000 MCG/ML injection Inject 1,000 mcg into the muscle every 30 (thirty) days. 2nd Wednesday of the month.   02/18/2014 at Unknown time  . docusate sodium 100 MG CAPS Take 100 mg by mouth 2 (two) times daily. 10 capsule 0   . ferrous sulfate 325 (65 FE) MG tablet Take 1 tablet (325 mg total) by mouth 3 (three) times daily after meals.  3   . HYDROcodone-acetaminophen ([place])  7.5-325 MG per tablet Take 1-2 tablets by mouth every 4 (four) hours as needed for moderate pain. 100 tablet 0   . ketorolac (TORADOL) 10 MG tablet Take 10 mg by mouth at bedtime.    02/24/2014 at Unknown time  . loratadine (CLARITIN) 10 MG tablet Take 10 mg by mouth at bedtime.    02/24/2014 at PM  . methocarbamol (ROBAXIN) 500 MG tablet Take 1 tablet (500 mg total) by mouth every 6 (six) hours as needed for muscle spasms. 50 tablet 0   . polyethylene glycol (MIRALAX / GLYCOLAX) packet Take 17 g by mouth 2 (two) times daily. 14 each 0   . pravastatin (PRAVACHOL) 40 MG tablet Take 40 mg by mouth at  bedtime.    02/24/2014 at PM  . promethazine (PHENERGAN) 25 MG tablet Take 25 mg by mouth every 4 (four) hours as needed for nausea or vomiting.   09/14/2013 at Unknown time  . tamsulosin (FLOMAX) 0.4 MG CAPS capsule Take 0.4 mg by mouth daily.   09/14/2013 at Unknown time  . zolpidem (AMBIEN) 5 MG tablet Take 1 tablet (5 mg total) by mouth at bedtime as needed for sleep. 30 tablet 0     HOSPITAL MEDICATIONS: Prior to Admission:  Prescriptions prior to admission  Medication Sig Dispense Refill Last Dose  . allopurinol (ZYLOPRIM) 300 MG tablet Take 300 mg by mouth at bedtime.    02/24/2014 at PM  . cholecalciferol (VITAMIN D) 1000 UNITS tablet Take 1,000 Units by mouth at bedtime.    02/17/2014 at Unknown time  . cyanocobalamin (,VITAMIN B-12,) 1000 MCG/ML injection Inject 1,000 mcg into the muscle every 30 (thirty) days. 2nd Wednesday of the month.   02/18/2014 at Unknown time  . docusate sodium 100 MG CAPS Take 100 mg by mouth 2 (two) times daily. 10 capsule 0   . ferrous sulfate 325 (65 FE) MG tablet Take 1 tablet (325 mg total) by mouth 3 (three) times daily after meals.  3   . HYDROcodone-acetaminophen ([place]) 7.5-325 MG per tablet Take 1-2 tablets by mouth every 4 (four) hours as needed for moderate pain. 100 tablet 0   . ketorolac (TORADOL) 10 MG tablet Take 10 mg by mouth at bedtime.    02/24/2014 at Unknown time  . loratadine (CLARITIN) 10 MG tablet Take 10 mg by mouth at bedtime.    02/24/2014 at PM  . methocarbamol (ROBAXIN) 500 MG tablet Take 1 tablet (500 mg total) by mouth every 6 (six) hours as needed for muscle spasms. 50 tablet 0   . polyethylene glycol (MIRALAX / GLYCOLAX) packet Take 17 g by mouth 2 (two) times daily. 14 each 0   . pravastatin (PRAVACHOL) 40 MG tablet Take 40 mg by mouth at bedtime.    02/24/2014 at PM  . promethazine (PHENERGAN) 25 MG tablet Take 25 mg by mouth every 4 (four) hours as needed for nausea or vomiting.   09/14/2013 at Unknown time  . tamsulosin  (FLOMAX) 0.4 MG CAPS capsule Take 0.4 mg by mouth daily.   09/14/2013 at Unknown time  . zolpidem (AMBIEN) 5 MG tablet Take 1 tablet (5 mg total) by mouth at bedtime as needed for sleep. 30 tablet 0     VITALS: Blood pressure 112/65, pulse 82, temperature 98.3 F (36.8 C), temperature source Oral, resp. rate 18, height 5' 10" (1.778 m), weight 232 lb 14.4 oz (105.643 kg), SpO2 97 %.  PHYSICAL EXAM: General appearance: alert and no distress Neck: no carotid  bruit and no JVD Lungs: clear to auscultation bilaterally Heart: regular rate and rhythm, S1, S2 normal, no murmur, click, rub or gallop Abdomen: soft, non-tender; bowel sounds normal; no masses,  no organomegaly Extremities: extremities normal, atraumatic, no cyanosis or edema Pulses: 2+ and symmetric Skin: Skin color, texture, turgor normal. No rashes or lesions Neurologic: Grossly normal Psych: Pleasant  LABS: Results for orders placed or performed during the hospital encounter of 06/29/14 (from the past 48 hour(s))  I-stat troponin, ED (not at [facility])     Status: None   Collection Time: 06/29/14  2:06 AM  Result Value Ref Range   Troponin i, poc 0.00 0.00 - 0.08 ng/mL   Comment 3            Comment: Due to the release kinetics of cTnI, a negative result within the first hours of the onset of symptoms does not rule out myocardial infarction with certainty. If myocardial infarction is still suspected, repeat the test at appropriate intervals.   Basic metabolic panel     Status: Abnormal   Collection Time: 06/29/14  2:19 AM  Result Value Ref Range   Sodium 136 135 - 145 mmol/L   Potassium 4.6 3.5 - 5.1 mmol/L   Chloride 99 96 - 112 mmol/L   CO2 31 19 - 32 mmol/L   Glucose, Bld 239 (H) 70 - 99 mg/dL   BUN 10 6 - 23 mg/dL   Creatinine, Ser 0.98 0.50 - 1.35 mg/dL   Calcium 9.0 8.4 - 10.5 mg/dL   GFR calc non Af Amer 88 (L) >90 mL/min   GFR calc Af Amer >90 >90 mL/min    Comment: (NOTE) The eGFR has been calculated using  the CKD EPI equation. This calculation has not been validated in all clinical situations. eGFR's persistently <90 mL/min signify possible Chronic Kidney Disease.    Anion gap 6 5 - 15  BNP (order ONLY if patient complains of dyspnea/SOB AND you have documented it for THIS visit)     Status: None   Collection Time: 06/29/14  2:20 AM  Result Value Ref Range   B Natriuretic Peptide 58.9 0.0 - 100.0 pg/mL  Glucose, capillary     Status: Abnormal   Collection Time: 06/29/14  6:21 AM  Result Value Ref Range   Glucose-Capillary 105 (H) 70 - 99 mg/dL  Comprehensive metabolic panel     Status: Abnormal   Collection Time: 06/29/14  6:49 AM  Result Value Ref Range   Sodium 135 135 - 145 mmol/L   Potassium 4.3 3.5 - 5.1 mmol/L   Chloride 100 96 - 112 mmol/L   CO2 25 19 - 32 mmol/L   Glucose, Bld 124 (H) 70 - 99 mg/dL   BUN 9 6 - 23 mg/dL   Creatinine, Ser 0.91 0.50 - 1.35 mg/dL   Calcium 8.8 8.4 - 10.5 mg/dL   Total Protein 6.6 6.0 - 8.3 g/dL   Albumin 3.6 3.5 - 5.2 g/dL   AST 22 0 - 37 U/L   ALT 16 0 - 53 U/L   Alkaline Phosphatase 85 39 - 117 U/L   Total Bilirubin 0.5 0.3 - 1.2 mg/dL   GFR calc non Af Amer >90 >90 mL/min   GFR calc Af Amer >90 >90 mL/min    Comment: (NOTE) The eGFR has been calculated using the CKD EPI equation. This calculation has not been validated in all clinical situations. eGFR's persistently <90 mL/min signify possible Chronic Kidney Disease.  Anion gap 10 5 - 15  CBC     Status: None   Collection Time: 06/29/14  6:49 AM  Result Value Ref Range   WBC 10.4 4.0 - 10.5 K/uL   RBC 4.57 4.22 - 5.81 MIL/uL   Hemoglobin 14.8 13.0 - 17.0 g/dL   HCT 43.5 39.0 - 52.0 %   MCV 95.2 78.0 - 100.0 fL   MCH 32.4 26.0 - 34.0 pg   MCHC 34.0 30.0 - 36.0 g/dL   RDW 13.5 11.5 - 15.5 %   Platelets 167 150 - 400 K/uL  Troponin I (q 6hr x 3)     Status: None   Collection Time: 06/29/14  6:49 AM  Result Value Ref Range   Troponin I <0.03 <0.031 ng/mL    Comment:          NO INDICATION OF MYOCARDIAL INJURY.   Lipid panel     Status: Abnormal   Collection Time: 06/29/14  6:49 AM  Result Value Ref Range   Cholesterol 148 0 - 200 mg/dL   Triglycerides 136 <150 mg/dL   HDL 33 (L) >39 mg/dL   Total CHOL/HDL Ratio 4.5 RATIO   VLDL 27 0 - 40 mg/dL   LDL Cholesterol 88 0 - 99 mg/dL    Comment:        Total Cholesterol/HDL:CHD Risk Coronary Heart Disease Risk Table                     Men   Women  1/2 Average Risk   3.4   3.3  Average Risk       5.0   4.4  2 X Average Risk   9.6   7.1  3 X Average Risk  23.4   11.0        Use the calculated Patient Ratio above and the CHD Risk Table to determine the patient's CHD Risk.        ATP III CLASSIFICATION (LDL):  <100     mg/dL   Optimal  100-129  mg/dL   Near or Above                    Optimal  130-159  mg/dL   Borderline  160-189  mg/dL   High  >190     mg/dL   Very High   Glucose, capillary     Status: None   Collection Time: 06/29/14  8:06 AM  Result Value Ref Range   Glucose-Capillary 98 70 - 99 mg/dL    IMAGING: Dg Chest 2 View  06/29/2014   CLINICAL DATA:  Chest pain tonight with dizziness and shortness of breath.  EXAM: CHEST  2 VIEW  COMPARISON:  02/18/2014  FINDINGS: The cardiomediastinal contours are normal. Incidental note of an azygos fissure. Minimal atelectasis in the right middle lobe. Pulmonary vasculature is normal. No consolidation, pleural effusion, or pneumothorax. No acute osseous abnormalities are seen.  IMPRESSION: Minimal atelectasis in the right middle lobe. Otherwise no acute pulmonary process.   Electronically Signed   By: Jeb Levering M.D.   On: 06/29/2014 02:38    HOSPITAL DIAGNOSES: Principal Problem:   Chest pain Active Problems:   S/P left TKA   Obese   Diabetes mellitus type 2, uncontrolled   IMPRESSION/RECOMMENDATION 1. Unstable angina- Mr. Console is describing symptoms that are consistent with unstable angina. He was awakened with chest pain that was  substernal and radiated to his left arm. It was associated with  nausea and diaphoresis as well as vomiting 2. He had some mild improvement in symptoms but has some persistent pain. He is on aspirin daily. Risk factors include borderline diabetes, hypertension, obesity and strong family history of coronary disease. EKG shows nonspecific T-wave changes and borderline ST depression inferiorly. I'm recommending cardiac catheterization tomorrow. I discussed the risks and benefits of the procedure and he provided informed consent to proceed. Please keep nothing by mouth after midnight. Will add IV heparin and use nitroglycerin when necessary. Continue to follow troponins today. He is scheduled for 2-D echocardiogram.  Time Spent Directly with Patient: 30 minutes  Pixie Casino, MD, [facility] Attending Cardiologist [facility]  HILTY,Kenneth C 06/29/2014, 9:58 AM

## 2014-06-30 ENCOUNTER — Encounter (HOSPITAL_COMMUNITY)
Admission: EM | Disposition: A | Discharge status: Home / Self Care | Payer: Self-pay | Source: Home / Self Care | Attending: Family Medicine

## 2014-06-30 ENCOUNTER — Encounter (HOSPITAL_COMMUNITY): Payer: Self-pay | Admitting: Cardiovascular Disease

## 2014-06-30 DIAGNOSIS — R079 Chest pain, unspecified: Secondary | ICD-10-CM

## 2014-06-30 DIAGNOSIS — I471 Supraventricular tachycardia: Secondary | ICD-10-CM | POA: Diagnosis not present

## 2014-06-30 HISTORY — PX: LEFT HEART CATHETERIZATION WITH CORONARY ANGIOGRAM: SHX5451

## 2014-06-30 LAB — CBC
HCT: 44.1 % (ref 39.0–52.0)
HEMOGLOBIN: 14.5 g/dL (ref 13.0–17.0)
MCH: 31.1 pg (ref 26.0–34.0)
MCHC: 32.9 g/dL (ref 30.0–36.0)
MCV: 94.6 fL (ref 78.0–100.0)
Platelets: 172 10*3/uL (ref 150–400)
RBC: 4.66 MIL/uL (ref 4.22–5.81)
RDW: 13.6 % (ref 11.5–15.5)
WBC: 8.9 10*3/uL (ref 4.0–10.5)

## 2014-06-30 LAB — BASIC METABOLIC PANEL
ANION GAP: 9 (ref 5–15)
BUN: 10 mg/dL (ref 6–23)
CO2: 28 mmol/L (ref 19–32)
CREATININE: 1 mg/dL (ref 0.50–1.35)
Calcium: 9.4 mg/dL (ref 8.4–10.5)
Chloride: 100 mmol/L (ref 96–112)
GFR calc Af Amer: >90 mL/min (ref >90)
GFR calc non Af Amer: 80 mL/min — ABNORMAL LOW (ref >90)
GLUCOSE: 122 mg/dL — AB (ref 70–99)
POTASSIUM: 3.8 mmol/L (ref 3.5–5.1)
Sodium: 137 mmol/L (ref 135–145)

## 2014-06-30 LAB — CK TOTAL AND CKMB (NOT AT ARMC)
CK, MB: 3.1 ng/mL (ref 0.3–4.0)
RELATIVE INDEX: INVALID (ref 0.0–2.5)
Total CK: 78 U/L (ref 7–232)

## 2014-06-30 LAB — HEPARIN LEVEL (UNFRACTIONATED): Heparin Unfractionated: 0.32 IU/mL (ref 0.30–0.70)

## 2014-06-30 LAB — GLUCOSE, CAPILLARY
GLUCOSE-CAPILLARY: 89 mg/dL (ref 70–99)
GLUCOSE-CAPILLARY: 122 mg/dL — AB (ref 70–99)
Glucose-Capillary: 115 mg/dL — ABNORMAL HIGH (ref 70–99)
Glucose-Capillary: 128 mg/dL — ABNORMAL HIGH (ref 70–99)

## 2014-06-30 LAB — PROTIME-INR
INR: 1.09 (ref 0.00–1.49)
Prothrombin Time: 14.3 seconds (ref 11.6–15.2)

## 2014-06-30 LAB — HEMOGLOBIN A1C
HEMOGLOBIN A1C: 6.3 % — AB (ref 4.8–5.6)
MEAN PLASMA GLUCOSE: 134 mg/dL

## 2014-06-30 SURGERY — LEFT HEART CATHETERIZATION WITH CORONARY ANGIOGRAM
Anesthesia: LOCAL

## 2014-06-30 MED ORDER — NITROGLYCERIN 1 MG/10 ML FOR IR/CATH LAB
INTRA_ARTERIAL | Status: AC
  Filled 2014-06-30: qty 10

## 2014-06-30 MED ORDER — ASPIRIN EC 81 MG PO TBEC
81.0000 mg | DELAYED_RELEASE_TABLET | Freq: Every day | ORAL | Status: DC
  Administered 2014-07-01: 81 mg via ORAL
  Filled 2014-06-30: qty 1

## 2014-06-30 MED ORDER — CARVEDILOL 3.125 MG PO TABS
3.1250 mg | ORAL_TABLET | Freq: Two times a day (BID) | ORAL | Status: DC
  Administered 2014-06-30: 3.125 mg via ORAL
  Filled 2014-06-30: qty 1

## 2014-06-30 MED ORDER — SODIUM CHLORIDE 0.9 % IV SOLN
INTRAVENOUS | Status: DC
  Administered 2014-06-30: 13:00:00 via INTRAVENOUS

## 2014-06-30 MED ORDER — HEPARIN (PORCINE) IN NACL 2-0.9 UNIT/ML-% IJ SOLN
INTRAMUSCULAR | Status: AC
  Filled 2014-06-30: qty 1000

## 2014-06-30 MED ORDER — FENTANYL CITRATE 0.05 MG/ML IJ SOLN
INTRAMUSCULAR | Status: AC
  Filled 2014-06-30: qty 2

## 2014-06-30 MED ORDER — MIDAZOLAM HCL 2 MG/2ML IJ SOLN
INTRAMUSCULAR | Status: AC
  Filled 2014-06-30: qty 2

## 2014-06-30 MED ORDER — ONDANSETRON HCL 4 MG/2ML IJ SOLN: 4.0000 mg | Freq: Four times a day (QID) | INTRAMUSCULAR | Status: DC | PRN

## 2014-06-30 MED ORDER — ACETAMINOPHEN 325 MG PO TABS: 650.0000 mg | ORAL_TABLET | ORAL | Status: DC | PRN

## 2014-06-30 MED ORDER — METOPROLOL TARTRATE 25 MG PO TABS
25.0000 mg | ORAL_TABLET | Freq: Two times a day (BID) | ORAL | Status: DC
  Administered 2014-06-30 – 2014-07-01 (×3): 25 mg via ORAL
  Filled 2014-06-30 (×3): qty 1

## 2014-06-30 MED ORDER — VERAPAMIL HCL 2.5 MG/ML IV SOLN
INTRAVENOUS | Status: AC
  Filled 2014-06-30: qty 2

## 2014-06-30 MED ORDER — NITROGLYCERIN 1 MG/10 ML FOR IR/CATH LAB
INTRA_ARTERIAL | Status: AC
Start: 2014-06-30 — End: 2014-06-30
  Filled 2014-06-30: qty 10

## 2014-06-30 MED ORDER — HEPARIN SODIUM (PORCINE) 1000 UNIT/ML IJ SOLN
INTRAMUSCULAR | Status: AC
  Filled 2014-06-30: qty 1

## 2014-06-30 MED ORDER — LIDOCAINE HCL (PF) 1 % IJ SOLN
INTRAMUSCULAR | Status: AC
  Filled 2014-06-30: qty 30

## 2014-06-30 NOTE — Interval H&P Note (Signed)
Cath Lab Visit (complete for each Cath Lab visit)  Clinical Evaluation Leading to the Procedure:   ACS: No.  Non-ACS:    Anginal Classification: CCS III  Anti-ischemic medical therapy: Maximal Therapy (2 or more classes of medications)  Non-Invasive Test Results: No non-invasive testing performed  Prior CABG: No previous CABG      History and Physical Interval Note:  06/30/2014 12:02 PM  Frederick Keller  has presented today for surgery, with the diagnosis of unstable angina  The various methods of treatment have been discussed with the patient and family. After consideration of risks, benefits and other options for treatment, the patient has consented to  Procedure(s): LEFT HEART CATHETERIZATION WITH CORONARY ANGIOGRAM (N/A) as a surgical intervention .  The patient's history has been reviewed, patient examined, no change in status, stable for surgery.  I have reviewed the patient's chart and labs.  Questions were answered to the patient's satisfaction.     KELLY,THOMAS A

## 2014-06-30 NOTE — Progress Notes (Signed)
Patient Name: Frederick Keller Date of Encounter: 06/30/2014  Principal Problem:   Unstable angina Active Problems:   S/P left TKA   Obese   Diabetes mellitus type 2, uncontrolled   Primary Cardiologist: Dr. Debara Pickett  Patient Profile: 59 yo male w/ no CAD, hx DM, OA, HL, FH CAD, admitted 03/27 for chest pain, cath planned 03/28.  SUBJECTIVE: No chest pain or SOB overnight.  OBJECTIVE Filed Vitals:   06/29/14 0507 06/29/14 0600 06/30/14 0230 06/30/14 0516  BP: 112/65  141/78 156/89  Pulse: 82   93  Temp:  98.3 F (36.8 C) 98.5 F (36.9 C) 97.7 F (36.5 C)  TempSrc:  Oral Oral Oral  Resp: 18  16 18   Height: 5\' 10"  (1.778 m)     Weight: 232 lb 14.4 oz (105.643 kg)   232 lb 3.2 oz (105.325 kg)  SpO2: 97%  96% 94%    Intake/Output Summary (Last 24 hours) at 06/30/14 0654 Last data filed at 06/29/14 2331  Gross per 24 hour  Intake    300 ml  Output      0 ml  Net    300 ml   Filed Weights   06/29/14 0507 06/30/14 0516  Weight: 232 lb 14.4 oz (105.643 kg) 232 lb 3.2 oz (105.325 kg)    PHYSICAL EXAM General: Well developed, well nourished, male in no acute distress. Head: Normocephalic, atraumatic.  Neck: Supple without bruits, JVD not elevated. Lungs:  Resp regular and unlabored, CTA. Heart: RRR, S1, S2, no S3, S4, or murmur; no rub. Abdomen: Soft, non-tender, non-distended, BS + x 4.  Extremities: No clubbing, cyanosis, no edema.  Neuro: Alert and oriented X 3. Moves all extremities spontaneously. Psych: Normal affect.  LABS: CBC: Recent Labs  06/29/14 0649 06/30/14 0340  WBC 10.4 8.9  HGB 14.8 14.5  HCT 43.5 44.1  MCV 95.2 94.6  PLT 167 172   INR: Recent Labs  06/30/14 0340  INR 0.16   Basic Metabolic Panel: Recent Labs  06/29/14 0649 06/30/14 0340  NA 135 137  K 4.3 3.8  CL 100 100  CO2 25 28  GLUCOSE 124* 122*  BUN 9 10  CREATININE 0.91 1.00  CALCIUM 8.8 9.4   Liver Function Tests: Recent Labs  06/29/14 0649  AST 22  ALT 16    ALKPHOS 85  BILITOT 0.5  PROT 6.6  ALBUMIN 3.6   Cardiac Enzymes: Recent Labs  06/29/14 0649 06/29/14 1155 06/29/14 1858  TROPONINI <0.03 <0.03 0.03    Recent Labs  06/29/14 0206  TROPIPOC 0.00   BNP:  B NATRIURETIC PEPTIDE  Date/Time Value Ref Range Status  06/29/2014 02:20 AM 58.9 0.0 - 100.0 pg/mL Final   Fasting Lipid Panel: Recent Labs  06/29/14 0649  CHOL 148  HDL 33*  LDLCALC 88  TRIG 136  CHOLHDL 4.5   Lab Results  Component Value Date   HGBA1C * 01/03/2008    6.4 (NOTE)   The ADA recommends the following therapeutic goal for glycemic   control related to Hgb A1C measurement:   Goal of Therapy:   < 7.0% Hgb A1C   Reference: American Diabetes Association: Clinical Practice   Recommendations 2008, Diabetes Care,  2008, 31:(Suppl 1).    TELE:  SR, ST  Radiology/Studies: Dg Chest 2 View 06/29/2014   CLINICAL DATA:  Chest pain tonight with dizziness and shortness of breath.  EXAM: CHEST  2 VIEW  COMPARISON:  02/18/2014  FINDINGS: The cardiomediastinal contours  are normal. Incidental note of an azygos fissure. Minimal atelectasis in the right middle lobe. Pulmonary vasculature is normal. No consolidation, pleural effusion, or pneumothorax. No acute osseous abnormalities are seen.  IMPRESSION: Minimal atelectasis in the right middle lobe. Otherwise no acute pulmonary process.   Electronically Signed   By: Jeb Levering M.D.   On: 06/29/2014 02:38     Current Medications:  . allopurinol  300 mg Oral Daily  . aspirin  81 mg Oral Daily  . insulin aspart  0-15 Units Subcutaneous TID WC  . insulin aspart  0-5 Units Subcutaneous QHS  . pantoprazole  40 mg Oral Daily  . pneumococcal 23 valent vaccine  0.5 mL Intramuscular Tomorrow-1000  . sodium chloride  3 mL Intravenous Q12H   . sodium chloride 1 mL/kg/hr (06/30/14 0512)  . heparin 1,500 Units/hr (06/29/14 2331)    ASSESSMENT AND PLAN: Principal Problem:   Unstable angina - [name] neg MI - pain-free on  aspirin and heparin. - For cath today, 1:30 PM, give clear liquids now - The risks and benefits of a cardiac catheterization including, but not limited to, death, stroke, MI, kidney damage and bleeding were discussed with the patient who indicates understanding and agrees to proceed.  - Add low-dose beta blocker  Active Problems:   S/P left TKA, November 2015 - Encouraged him to increase his activity once okay with cardiology    Obese - Encouraged diet compliance with a heart-healthy diabetic diet    Diabetes mellitus type 2, uncontrolled - Says his last hemoglobin A1c was 6.7, recheck pending - Encouraged diet compliance  - SVT   Signed, Rosaria Ferries , PA-C 6:54 AM 06/30/2014 Patient seen and examined and history reviewed. Agree with above findings and plan. Patient still complains of chest tightness and SOB. He had an episode of SVT rate 170 this am that terminated spontaneously. He reports a history of syncope 15 years ago and had cardiac cath at that time without significant findings. Will add beta blocker for SVT. Plan cardiac cath with possible PCI today. Questions answered. Will check Echo today as well.  Peter Martinique, [place] 06/30/2014 9:31 AM

## 2014-06-30 NOTE — Research (Signed)
Bioflow Informed Consent   Subject Name: Frederick Keller  Subject met inclusion and exclusion criteria.  The informed consent form, study requirements and expectations were reviewed with the subject and questions and concerns were addressed prior to the signing of the consent form.  The subject verbalized understanding of the trail requirements.  The subject agreed to participate in the Bioflow trial and signed the informed consent.  The informed consent was obtained prior to performance of any protocol-specific procedures for the subject.  A copy of the signed informed consent was given to the subject and a copy was placed in the subject's medical record.  [name] 06/30/2014, 8:00

## 2014-06-30 NOTE — H&P (View-Only) (Signed)
Patient Name: Frederick Keller Date of Encounter: 06/30/2014  Principal Problem:   Unstable angina Active Problems:   S/P left TKA   Obese   Diabetes mellitus type 2, uncontrolled   Primary Cardiologist: Dr. Debara Pickett  Patient Profile: 59 yo male w/ no CAD, hx DM, OA, HL, FH CAD, admitted 03/27 for chest pain, cath planned 03/28.  SUBJECTIVE: No chest pain or SOB overnight.  OBJECTIVE Filed Vitals:   06/29/14 0507 06/29/14 0600 06/30/14 0230 06/30/14 0516  BP: 112/65  141/78 156/89  Pulse: 82   93  Temp:  98.3 F (36.8 C) 98.5 F (36.9 C) 97.7 F (36.5 C)  TempSrc:  Oral Oral Oral  Resp: 18  16 18   Height: 5\' 10"  (1.778 m)     Weight: 232 lb 14.4 oz (105.643 kg)   232 lb 3.2 oz (105.325 kg)  SpO2: 97%  96% 94%    Intake/Output Summary (Last 24 hours) at 06/30/14 0654 Last data filed at 06/29/14 2331  Gross per 24 hour  Intake    300 ml  Output      0 ml  Net    300 ml   Filed Weights   06/29/14 0507 06/30/14 0516  Weight: 232 lb 14.4 oz (105.643 kg) 232 lb 3.2 oz (105.325 kg)    PHYSICAL EXAM General: Well developed, well nourished, male in no acute distress. Head: Normocephalic, atraumatic.  Neck: Supple without bruits, JVD not elevated. Lungs:  Resp regular and unlabored, CTA. Heart: RRR, S1, S2, no S3, S4, or murmur; no rub. Abdomen: Soft, non-tender, non-distended, BS + x 4.  Extremities: No clubbing, cyanosis, no edema.  Neuro: Alert and oriented X 3. Moves all extremities spontaneously. Psych: Normal affect.  LABS: CBC: Recent Labs  06/29/14 0649 06/30/14 0340  WBC 10.4 8.9  HGB 14.8 14.5  HCT 43.5 44.1  MCV 95.2 94.6  PLT 167 172   INR: Recent Labs  06/30/14 0340  INR 0.10   Basic Metabolic Panel: Recent Labs  06/29/14 0649 06/30/14 0340  NA 135 137  K 4.3 3.8  CL 100 100  CO2 25 28  GLUCOSE 124* 122*  BUN 9 10  CREATININE 0.91 1.00  CALCIUM 8.8 9.4   Liver Function Tests: Recent Labs  06/29/14 0649  AST 22  ALT 16    ALKPHOS 85  BILITOT 0.5  PROT 6.6  ALBUMIN 3.6   Cardiac Enzymes: Recent Labs  06/29/14 0649 06/29/14 1155 06/29/14 1858  TROPONINI <0.03 <0.03 0.03    Recent Labs  06/29/14 0206  TROPIPOC 0.00   BNP:  B NATRIURETIC PEPTIDE  Date/Time Value Ref Range Status  06/29/2014 02:20 AM 58.9 0.0 - 100.0 pg/mL Final   Fasting Lipid Panel: Recent Labs  06/29/14 0649  CHOL 148  HDL 33*  LDLCALC 88  TRIG 136  CHOLHDL 4.5   Lab Results  Component Value Date   HGBA1C * 01/03/2008    6.4 (NOTE)   The ADA recommends the following therapeutic goal for glycemic   control related to Hgb A1C measurement:   Goal of Therapy:   < 7.0% Hgb A1C   Reference: American Diabetes Association: Clinical Practice   Recommendations 2008, Diabetes Care,  2008, 31:(Suppl 1).    TELE:  SR, ST  Radiology/Studies: Dg Chest 2 View 06/29/2014   CLINICAL DATA:  Chest pain tonight with dizziness and shortness of breath.  EXAM: CHEST  2 VIEW  COMPARISON:  02/18/2014  FINDINGS: The cardiomediastinal contours  are normal. Incidental note of an azygos fissure. Minimal atelectasis in the right middle lobe. Pulmonary vasculature is normal. No consolidation, pleural effusion, or pneumothorax. No acute osseous abnormalities are seen.  IMPRESSION: Minimal atelectasis in the right middle lobe. Otherwise no acute pulmonary process.   Electronically Signed   By: Frederick Keller M.D.   On: 06/29/2014 02:38     Current Medications:  . allopurinol  300 mg Oral Daily  . aspirin  81 mg Oral Daily  . insulin aspart  0-15 Units Subcutaneous TID WC  . insulin aspart  0-5 Units Subcutaneous QHS  . pantoprazole  40 mg Oral Daily  . pneumococcal 23 valent vaccine  0.5 mL Intramuscular Tomorrow-1000  . sodium chloride  3 mL Intravenous Q12H   . sodium chloride 1 mL/kg/hr (06/30/14 0512)  . heparin 1,500 Units/hr (06/29/14 2331)    ASSESSMENT AND PLAN: Principal Problem:   Unstable angina - [name] neg MI - pain-free on  aspirin and heparin. - For cath today, 1:30 PM, give clear liquids now - The risks and benefits of a cardiac catheterization including, but not limited to, death, stroke, MI, kidney damage and bleeding were discussed with the patient who indicates understanding and agrees to proceed.  - Add low-dose beta blocker  Active Problems:   S/P left TKA, November 2015 - Encouraged him to increase his activity once okay with cardiology    Obese - Encouraged diet compliance with a heart-healthy diabetic diet    Diabetes mellitus type 2, uncontrolled - Says his last hemoglobin A1c was 6.7, recheck pending - Encouraged diet compliance  - SVT   Signed, Frederick Keller , PA-C 6:54 AM 06/30/2014 Patient seen and examined and history reviewed. Agree with above findings and plan. Patient still complains of chest tightness and SOB. He had an episode of SVT rate 170 this am that terminated spontaneously. He reports a history of syncope 15 years ago and had cardiac cath at that time without significant findings. Will add beta blocker for SVT. Plan cardiac cath with possible PCI today. Questions answered. Will check Echo today as well.  Frederick Keller, [place] 06/30/2014 9:31 AM

## 2014-06-30 NOTE — Progress Notes (Signed)
[facility] PROGRESS NOTE  Frederick Keller HQP:[id number] DOB: [birth year] DOA: 06/29/2014 PCP: Irven Shelling, MD  Assessment/Plan: 1. Chest pain- resolved, patient underwent cardiac catheterization which showed mild global LV dysfunction with EF 45%. Normal epicardial coronary arteries. Patient started on metoprolol 25 mg by mouth twice a day. 2. SVT- patient had episode of SVT this morning, started on metoprolol 25 mg twice a day. 3. Diabetes mellitus- continue sliding scale insulin with NovoLog. Blood glucose is well controlled.  Code Status: Full code Family Communication:  Discussed with wife and daughters at bedside Disposition Plan: Home when stable   Consultants:  Cardiology  Procedures:  Cardiac catheterization  Antibiotics:  None  HPI/Subjective: Patient came with chest pain, diaphoresis with radiation to left arm. He also has a history of diabetes mellitus, diet-controlled, hyperlipidemia, strong family history of heart disease. Cardiology  was consulted and patient underwent cardiac catheterization today which showed mild systolic dysfunction. Patient also had episode of SVT this morning and was started on metoprolol 25 mg twice a day.,   Objective: Filed Vitals:   06/30/14 1447  BP: 121/76  Pulse: 83  Temp:   Resp: 20    Intake/Output Summary (Last 24 hours) at 06/30/14 1704 Last data filed at 06/29/14 2331  Gross per 24 hour  Intake    300 ml  Output      0 ml  Net    300 ml   Filed Weights   06/29/14 0507 06/30/14 0516  Weight: 105.643 kg (232 lb 14.4 oz) 105.325 kg (232 lb 3.2 oz)    Exam:   General:  Appears in no acute distress   Cardiovascular: S1-S2 is regular  Respiratory - clear to auscultation bilaterally   Abdomen: Soft, nontender no organomegaly  Extremities- no edema noted in the lower extremities   Data Reviewed: Basic Metabolic Panel:  Recent Labs Lab 06/29/14 0219 06/29/14 0649 06/30/14 0340  NA 136 135  137  K 4.6 4.3 3.8  CL 99 100 100  CO2 31 25 28   GLUCOSE 239* 124* 122*  BUN 10 9 10   CREATININE 0.98 0.91 1.00  CALCIUM 9.0 8.8 9.4   Liver Function Tests:  Recent Labs Lab 06/29/14 0649  AST 22  ALT 16  ALKPHOS 85  BILITOT 0.5  PROT 6.6  ALBUMIN 3.6   No results for input(s): LIPASE, AMYLASE in the last 168 hours. No results for input(s): AMMONIA in the last 168 hours. CBC:  Recent Labs Lab 06/29/14 0649 06/30/14 0340  WBC 10.4 8.9  HGB 14.8 14.5  HCT 43.5 44.1  MCV 95.2 94.6  PLT 167 172   Cardiac Enzymes:  Recent Labs Lab 06/29/14 0649 06/29/14 1155 06/29/14 1858 06/30/14 0424  CKTOTAL  --   --   --  78  CKMB  --   --   --  3.1  TROPONINI <0.03 <0.03 0.03  --    BNP (last 3 results)  Recent Labs  06/29/14 0220  BNP 58.9    ProBNP (last 3 results) No results for input(s): PROBNP in the last 8760 hours.  CBG:  Recent Labs Lab 06/29/14 1639 06/29/14 2140 06/30/14 0720 06/30/14 1303 06/30/14 1620  GLUCAP 140* 117* 115* 89 128*    No results found for this or any previous visit (from the past 240 hour(s)).   Studies: Dg Chest 2 View  06/29/2014   CLINICAL DATA:  Chest pain tonight with dizziness and shortness of breath.  EXAM: CHEST  2 VIEW  COMPARISON:  02/18/2014  FINDINGS: The cardiomediastinal contours are normal. Incidental note of an azygos fissure. Minimal atelectasis in the right middle lobe. Pulmonary vasculature is normal. No consolidation, pleural effusion, or pneumothorax. No acute osseous abnormalities are seen.  IMPRESSION: Minimal atelectasis in the right middle lobe. Otherwise no acute pulmonary process.   Electronically Signed   By: Jeb Levering M.D.   On: 06/29/2014 02:38    Scheduled Meds: . allopurinol  300 mg Oral Daily  . aspirin EC  81 mg Oral Daily  . insulin aspart  0-15 Units Subcutaneous TID WC  . insulin aspart  0-5 Units Subcutaneous QHS  . metoprolol tartrate  25 mg Oral BID  . pantoprazole  40 mg Oral  Daily  . pneumococcal 23 valent vaccine  0.5 mL Intramuscular Tomorrow-1000  . sodium chloride  3 mL Intravenous Q12H   Continuous Infusions: . sodium chloride 125 mL/hr at 06/30/14 1327    Principal Problem:   Unstable angina Active Problems:   S/P left TKA   Obese   Diabetes mellitus type 2, uncontrolled   Paroxysmal SVT (supraventricular tachycardia)   Pain in the chest    Time spent: 25 min    [facility] Pager [phone]. If 7PM-7AM, please contact night-coverage at [email or web address], password [facility] 06/30/2014, 5:04 PM  LOS: 1 day

## 2014-06-30 NOTE — Progress Notes (Signed)
ANTICOAGULATION CONSULT NOTE - Follow Up Consult  Pharmacy Consult for heparin Indication: USAP   Labs:  Recent Labs  06/29/14 0219 06/29/14 0649 06/29/14 1155 06/29/14 1540 06/29/14 1858 06/29/14 2311  HGB  --  14.8  --   --   --   --   HCT  --  43.5  --   --   --   --   PLT  --  167  --   --   --   --   HEPARINUNFRC  --   --   --  0.16*  --  0.39  CREATININE 0.98 0.91  --   --   --   --   TROPONINI  --  <0.03 <0.03  --  0.03  --       Assessment/Plan:  59yo male therapeutic on heparin after rate increase. Will continue gtt at current rate and confirm stable with am labs.   Wynona Neat, PharmD, BCPS  06/30/2014,12:07 AM

## 2014-06-30 NOTE — Progress Notes (Signed)
Spoke with Dr Martinique and called the patient in his room.  No sig. CAD at cath, but had LVD at cath and worse LVD by echo, EF 25-30%.  Dr Martinique recommends watching overnight for volume and SVT, adjust medications and discharge when medically stable.   Called patient in his room and he is aware.  Rosaria Ferries, PA-C 06/30/2014 4:47 PM Beeper [phone]

## 2014-06-30 NOTE — CV Procedure (Signed)
Frederick Keller is a 59 y.o. male   [id number]  [id number] LOCATION:  FACILITY: [facility]  PHYSICIAN: Troy Sine, MD, [facility] June 18, 1955   DATE OF PROCEDURE:  06/30/2014     CARDIAC CATHETERIZATION    HISTORY:   [name] is a 59 year old male who has a history of hypertension, diabetes mellitus, and who was awakened with substernal chest discomfort radiating to his left arm associated with nausea, diaphoresis and vomiting leading to his hospitalization.  There is remote tobacco history.  There is family history for CAD.  Cardiac enzymes are negative.  He is referred for definitive cardiac catheterization.   PROCEDURE:  Left heart catheterization via the radial approach: Coronary angiography, left ventriculography.  The patient was brought to the second floor [facility] cath lab in the postabsorptive state. The patient was premedicated with Versed 2 mg and fentanyl 50 mcg. A right radial approach was utilized after an Allen's test verified adequate circulation. The right radial artery was punctured via the Seldinger technique, and a 6 [place] Glidesheath Slender was inserted without difficulty.  A radial cocktail consisting of Verapamil, IV nitroglycerin, and lidocaine was administered. Weight adjusted heparin was administered. A safety J wire was advanced into the ascending aorta. Diagnostic catheterization was done with a 5 [place] TIG 4.0 catheter. A 5 French pigtail catheter was used for left ventriculography. A TR radial band was applied for hemostasis. The patient left the catheterization laboratory in stable condition.   HEMODYNAMICS:   Central Aorta: 118/80  Left Ventricle: 118/14/22  ANGIOGRAPHY:   The Left Main coronary artery was angiographically normal and trifurcated into the LAD, Ramus Intermediate and Left Circumflex coronary artery.   The LAD was angiographically normal and gave rise to 2 major diagonal vessels and several septal perforating arteries. The vessel  extended to the LV apex.   The left circumflex coronary artery was angiographically normal  dominant vessel which supplied a PDA and PLA vessel.  The RCA was angiographically normal nondominant vessel  Left ventriculography revealed mildly reduced global LV function with an ejection fraction at a proximally 45%.  There was no evidence for mitral regurgitation.   IMPRESSION:  Mild global LV dysfunction with an ejection fraction at approximately 45%.  Normal epicardial coronary arteries.    Troy Sine, MD, [facility] 06/30/2014 12:54 PM

## 2014-06-30 NOTE — Progress Notes (Signed)
*  PRELIMINARY RESULTS* Echocardiogram 2D Echocardiogram has been performed.  Leavy Cella 06/30/2014, 10:12 AM

## 2014-07-01 DIAGNOSIS — I429 Cardiomyopathy, unspecified: Secondary | ICD-10-CM

## 2014-07-01 LAB — CBC
HCT: 43.7 % (ref 39.0–52.0)
HEMOGLOBIN: 14.3 g/dL (ref 13.0–17.0)
MCH: 31.1 pg (ref 26.0–34.0)
MCHC: 32.7 g/dL (ref 30.0–36.0)
MCV: 95 fL (ref 78.0–100.0)
Platelets: 153 10*3/uL (ref 150–400)
RBC: 4.6 MIL/uL (ref 4.22–5.81)
RDW: 13.7 % (ref 11.5–15.5)
WBC: 9.5 10*3/uL (ref 4.0–10.5)

## 2014-07-01 LAB — GLUCOSE, CAPILLARY: Glucose-Capillary: 146 mg/dL — ABNORMAL HIGH (ref 70–99)

## 2014-07-01 LAB — BASIC METABOLIC PANEL
Anion gap: 6 (ref 5–15)
BUN: 11 mg/dL (ref 6–23)
CALCIUM: 9.2 mg/dL (ref 8.4–10.5)
CHLORIDE: 99 mmol/L (ref 96–112)
CO2: 31 mmol/L (ref 19–32)
Creatinine, Ser: 1.03 mg/dL (ref 0.50–1.35)
GFR calc Af Amer: 90 mL/min — ABNORMAL LOW (ref >90)
GFR calc non Af Amer: 78 mL/min — ABNORMAL LOW (ref >90)
GLUCOSE: 115 mg/dL — AB (ref 70–99)
Potassium: 4 mmol/L (ref 3.5–5.1)
Sodium: 136 mmol/L (ref 135–145)

## 2014-07-01 MED ORDER — LORAZEPAM 1 MG PO TABS
1.0000 mg | ORAL_TABLET | Freq: Once | ORAL | Status: AC
  Administered 2014-07-01: 1 mg via ORAL
  Filled 2014-07-01 (×2): qty 1

## 2014-07-01 MED ORDER — LISINOPRIL 2.5 MG PO TABS
2.5000 mg | ORAL_TABLET | Freq: Every day | ORAL | Status: DC
  Administered 2014-07-01: 2.5 mg via ORAL
  Filled 2014-07-01: qty 1

## 2014-07-01 MED ORDER — LISINOPRIL 2.5 MG PO TABS: 2.5000 mg | ORAL_TABLET | Freq: Every day | ORAL | Status: DC

## 2014-07-01 MED ORDER — METOPROLOL TARTRATE 25 MG PO TABS: 25.0000 mg | ORAL_TABLET | Freq: Two times a day (BID) | ORAL | Status: DC

## 2014-07-01 NOTE — Progress Notes (Signed)
Pt d/c'd home with family via wheelchair. IV and tele d/c'd. VSS. No c/o pain. D/c instructions and prescriptions were reviewed and given. Pt and family verbalized understanding. Right radial cath site was clean and dry; no hematoma; dry dressing intact. Cath site restrictions and care were reviewed and given. Understand verbalized. No belongings left in the room.

## 2014-07-01 NOTE — Progress Notes (Addendum)
Patient Name: Frederick Keller Date of Encounter: 07/01/2014  Principal Problem:   Unstable angina Active Problems:   S/P left TKA   Obese   Diabetes mellitus type 2, uncontrolled   Paroxysmal SVT (supraventricular tachycardia), also had 3 bt NSVT   Pain in the chest   Primary Cardiologist: Dr Debara Pickett  Patient Profile: 59 yo male w/ no CAD, hx DM, OA, HL, FH CAD, admitted 03/27 for chest pain, cath 03/28 w/ no sig CAD, EF 45%, EF 25-30% by echo   SUBJECTIVE: Feels well today, denies SOB or edema, no palpitations.  OBJECTIVE Filed Vitals:   06/30/14 1447 06/30/14 2058 06/30/14 2229 07/01/14 0500  BP: 121/76 130/80 130/87 125/77  Pulse: 83 90 90 84  Temp:  98.1 F (36.7 C)  98.1 F (36.7 C)  TempSrc:  Oral  Oral  Resp: 20 18  18   Height:      Weight:    231 lb 11.2 oz (105.098 kg)  SpO2: 98% 98%  98%   No intake or output data in the 24 hours ending 07/01/14 0823 Filed Weights   06/29/14 0507 06/30/14 0516 07/01/14 0500  Weight: 232 lb 14.4 oz (105.643 kg) 232 lb 3.2 oz (105.325 kg) 231 lb 11.2 oz (105.098 kg)    PHYSICAL EXAM General: Well developed, well nourished, male in no acute distress. Head: Normocephalic, atraumatic.  Neck: Supple without bruits, JVD minimal elevation. Lungs:  Resp regular and unlabored, CTA. Heart: RRR, S1, S2, no S3, S4, or murmur; no rub. Abdomen: Soft, non-tender, non-distended, BS + x 4.  Extremities: No clubbing, cyanosis, no edema. R radial cath site w/out ecchymosis/hematoma Neuro: Alert and oriented X 3. Moves all extremities spontaneously. Psych: Normal affect.  LABS: CBC: Recent Labs  06/30/14 0340 07/01/14 0350  WBC 8.9 9.5  HGB 14.5 14.3  HCT 44.1 43.7  MCV 94.6 95.0  PLT 172 153   INR: Recent Labs  06/30/14 0340  INR 9.44   Basic Metabolic Panel: Recent Labs  06/30/14 0340 07/01/14 0350  NA 137 136  K 3.8 4.0  CL 100 99  CO2 28 31  GLUCOSE 122* 115*  BUN 10 11  CREATININE 1.00 1.03  CALCIUM 9.4  9.2   Liver Function Tests: Recent Labs  06/29/14 0649  AST 22  ALT 16  ALKPHOS 85  BILITOT 0.5  PROT 6.6  ALBUMIN 3.6   Cardiac Enzymes: Recent Labs  06/29/14 0649 06/29/14 1155 06/29/14 1858 06/30/14 0424  CKTOTAL  --   --   --  78  CKMB  --   --   --  3.1  TROPONINI <0.03 <0.03 0.03  --     Recent Labs  06/29/14 0206  TROPIPOC 0.00   BNP:  B NATRIURETIC PEPTIDE  Date/Time Value Ref Range Status  06/29/2014 02:20 AM 58.9 0.0 - 100.0 pg/mL Final   Hemoglobin A1C: Recent Labs  06/29/14 0649  HGBA1C 6.3*   Fasting Lipid Panel: Recent Labs  06/29/14 0649  CHOL 148  HDL 33*  LDLCALC 88  TRIG 136  CHOLHDL 4.5    TELE:  SR, 3 bt NSVT yesterday pm, run SVT am  Radiology/Studies: No results found.   Current Medications:  . allopurinol  300 mg Oral Daily  . aspirin EC  81 mg Oral Daily  . insulin aspart  0-15 Units Subcutaneous TID WC  . insulin aspart  0-5 Units Subcutaneous QHS  . metoprolol tartrate  25 mg Oral BID  .  pantoprazole  40 mg Oral Daily  . pneumococcal 23 valent vaccine  0.5 mL Intramuscular Tomorrow-1000  . sodium chloride  3 mL Intravenous Q12H   . sodium chloride 125 mL/hr at 06/30/14 1327    ASSESSMENT AND PLAN: Principal Problem:   Unstable angina - s/p cath w/ no sig CAD - LVD, NICM w/ EF 25-30% by echo, 45% at cath - started on metoprolol 03/28, 2 doses so far - MD advise on changing BB to Toprol XL or Coreg w/ NICM - had been started on lisinopril in the past, did not tolerate due to low BP, they are agreeable to trying lowest possible dose. - add lisinopril 2.5 mg daily and see how tolerated, SBP 120s-140s last 24 hours  Active Problems:    Nonischemic cardiomyopathy- minimally symptomatic with exertional fatigue.     S/P left TKA - encouraged him to increase activity gradually    Obese/Diabetes mellitus type 2, uncontrolled - encouraged diet compliance    Paroxysmal SVT (supraventricular tachycardia) -  asymptomatic, BB added    Pain in the chest - improved  MD advise if OK to d/c today, will need 2 wk f/u appt.  Signed, Rosaria Ferries , PA-C 8:23 AM 07/01/2014 Patient seen and examined and history reviewed. Agree with above findings and plan. Feels well. No chest pain. No dyspnea. Right wrist without hematoma. I personally reviewed cath and Echo data. Cath showed normal coronaries. EF reported at 45% but I think it is worse. Echo shows EF 25-30% and I think this is more accurate. Etiology of cardiomyopathy is unclear. No active myocarditis with normal cardiac enzymes and Ecg. This may have been present for some time. ? Related to diabetes. Recommend treatment with ACEi and beta blocker. He reports that in the past lisinopril caused his BP to drop so will need to start low and titrate. DC on metoprolol 25 mg bid and lisinopril 2.5 mg daily. Low sodium diet. He is OK to DC today- will arrange follow up with me to titrate meds.   Darric Plante Martinique, [place] 07/01/2014 9:35 AM

## 2014-07-01 NOTE — Discharge Summary (Signed)
Physician Discharge Summary  Frederick Keller BZJ:[id number] DOB: 14-Sep-1955 DOA: 06/29/2014  PCP: Irven Shelling, MD  Admit date: 06/29/2014 Discharge date: 07/01/2014  Time spent: 25* minutes  Recommendations for Outpatient Follow-up:  1. *Follow up PCP in 2 weeks 2. Follow up cardiology in one week  Discharge Diagnoses:  Principal Problem:   Unstable angina Active Problems:   S/P left TKA   Obese   Diabetes mellitus type 2, uncontrolled   Paroxysmal SVT (supraventricular tachycardia)   Pain in the chest   Discharge Condition: Stable  Diet recommendation: Low salt diet  Filed Weights   06/29/14 0507 06/30/14 0516 07/01/14 0500  Weight: 105.643 kg (232 lb 14.4 oz) 105.325 kg (232 lb 3.2 oz) 105.098 kg (231 lb 11.2 oz)    History of present illness:  Patient came with chest pain, diaphoresis with radiation to left arm. He also has a history of diabetes mellitus, diet-controlled, hyperlipidemia, strong family history of heart disease. Cardiology was consulted and patient underwent cardiac catheterization today which showed mild systolic dysfunction. Patient also had episode of SVT and was started on metoprolol 25 mg twice a day.,   Hospital Course:  1. Cardiomyopathy- resolved, patient underwent cardiac catheterization which showed mild global LV dysfunction with EF 45%. Normal epicardial coronary arteries. Patient started on metoprolol 25 mg by mouth twice a day. Also started on Lisinopril 2.5 mg po daily. 2. SVT- patient had episode of SVT this morning, started on metoprolol 25 mg twice a day. 3. Diabetes mellitus- diet controlled.  Procedures:  Cardiac catheterization  Consultations:  Cardiology  Discharge Exam: Filed Vitals:   07/01/14 0500  BP: 125/77  Pulse: 84  Temp: 98.1 F (36.7 C)  Resp: 18    General: Appear in no acute distress Cardiovascular: *S1S2 RRR Respiratory: *Clear bilaterally  Discharge Instructions   Discharge Instructions    Diet - low sodium heart healthy    Complete by:  As directed      Increase activity slowly    Complete by:  As directed           Current Discharge Medication List    START taking these medications   Details  lisinopril (PRINIVIL,ZESTRIL) 2.5 MG tablet Take 1 tablet (2.5 mg total) by mouth daily. Qty: 30 tablet, Refills: 2    metoprolol tartrate (LOPRESSOR) 25 MG tablet Take 1 tablet (25 mg total) by mouth 2 (two) times daily. Qty: 60 tablet, Refills: 2      CONTINUE these medications which have NOT CHANGED   Details  allopurinol (ZYLOPRIM) 300 MG tablet Take 300 mg by mouth at bedtime.     cholecalciferol (VITAMIN D) 1000 UNITS tablet Take 1,000 Units by mouth at bedtime.     cyanocobalamin (,VITAMIN B-12,) 1000 MCG/ML injection Inject 1,000 mcg into the muscle every 30 (thirty) days. 2nd Wednesday of the month.    docusate sodium 100 MG CAPS Take 100 mg by mouth 2 (two) times daily. Qty: 10 capsule, Refills: 0    ferrous sulfate 325 (65 FE) MG tablet Take 1 tablet (325 mg total) by mouth 3 (three) times daily after meals. Refills: 3    HYDROcodone-acetaminophen (NORCO) 7.5-325 MG per tablet Take 1-2 tablets by mouth every 4 (four) hours as needed for moderate pain. Qty: 100 tablet, Refills: 0    loratadine (CLARITIN) 10 MG tablet Take 10 mg by mouth at bedtime.     methocarbamol (ROBAXIN) 500 MG tablet Take 1 tablet (500 mg total) by mouth every 6 (six)  hours as needed for muscle spasms. Qty: 50 tablet, Refills: 0    polyethylene glycol (MIRALAX / GLYCOLAX) packet Take 17 g by mouth 2 (two) times daily. Qty: 14 each, Refills: 0    pramipexole (MIRAPEX) 0.25 MG tablet Take 0.5 mg by mouth at bedtime.  Refills: 0    pravastatin (PRAVACHOL) 40 MG tablet Take 40 mg by mouth at bedtime.     tamsulosin (FLOMAX) 0.4 MG CAPS capsule Take 0.4 mg by mouth daily as needed (kidney stones).     zolpidem (AMBIEN) 5 MG tablet Take 1 tablet (5 mg total) by mouth at bedtime as  needed for sleep. Qty: 30 tablet, Refills: 0       Allergies  Allergen Reactions  . Dilaudid [Hydromorphone Hcl] Nausea And Vomiting  . Penicillins Hives   Follow-up Information    Follow up with Pixie Casino, MD.   Specialty:  Cardiology   Why:  The office will call.   Contact information:   [address] [place] [address] [phone]       Follow up with Irven Shelling, MD In 2 weeks.   Specialty:  Internal Medicine   Contact information:   [address]  [place] [address] [phone]        The results of significant diagnostics from this hospitalization (including imaging, microbiology, ancillary and laboratory) are listed below for reference.    Significant Diagnostic Studies: Dg Chest 2 View  06/29/2014   CLINICAL DATA:  Chest pain tonight with dizziness and shortness of breath.  EXAM: CHEST  2 VIEW  COMPARISON:  02/18/2014  FINDINGS: The cardiomediastinal contours are normal. Incidental note of an azygos fissure. Minimal atelectasis in the right middle lobe. Pulmonary vasculature is normal. No consolidation, pleural effusion, or pneumothorax. No acute osseous abnormalities are seen.  IMPRESSION: Minimal atelectasis in the right middle lobe. Otherwise no acute pulmonary process.   Electronically Signed   By: Jeb Levering M.D.   On: 06/29/2014 02:38    Microbiology: No results found for this or any previous visit (from the past 240 hour(s)).   Labs: Basic Metabolic Panel:  Recent Labs Lab 06/29/14 0219 06/29/14 0649 06/30/14 0340 07/01/14 0350  NA 136 135 137 136  K 4.6 4.3 3.8 4.0  CL 99 100 100 99  CO2 31 25 28 31   GLUCOSE 239* 124* 122* 115*  BUN 10 9 10 11   CREATININE 0.98 0.91 1.00 1.03  CALCIUM 9.0 8.8 9.4 9.2   Liver Function Tests:  Recent Labs Lab 06/29/14 0649  AST 22  ALT 16  ALKPHOS 85  BILITOT 0.5  PROT 6.6  ALBUMIN 3.6   No results for input(s): LIPASE, AMYLASE in the last 168  hours. No results for input(s): AMMONIA in the last 168 hours. CBC:  Recent Labs Lab 06/29/14 0649 06/30/14 0340 07/01/14 0350  WBC 10.4 8.9 9.5  HGB 14.8 14.5 14.3  HCT 43.5 44.1 43.7  MCV 95.2 94.6 95.0  PLT 167 172 153   Cardiac Enzymes:  Recent Labs Lab 06/29/14 0649 06/29/14 1155 06/29/14 1858 06/30/14 0424  CKTOTAL  --   --   --  78  CKMB  --   --   --  3.1  TROPONINI <0.03 <0.03 0.03  --    BNP: BNP (last 3 results)  Recent Labs  06/29/14 0220  BNP 58.9    ProBNP (last 3 results) No results for input(s): PROBNP in the last 8760 hours.  CBG:  Recent Labs Lab 06/30/14 0720 06/30/14 1303 06/30/14 1620 06/30/14 2117 07/01/14 0816  GLUCAP 115* 89 128* 122* 146*       Signed:  Jarion Hawthorne S  [facility] 07/01/2014, 10:30 AM

## 2014-07-01 NOTE — Discharge Instructions (Signed)
OK to take the lisinopril at lunch, apart from the other medications, if you are concerned about it causing low blood pressure.

## 2014-07-02 ENCOUNTER — Telehealth: Payer: Self-pay | Admitting: Cardiology

## 2014-07-02 NOTE — Telephone Encounter (Signed)
TCM appt on 07/25/14 Per Suanne Marker Barrett w/ [facility] . Per Suanne Marker said 2 wk TCM not sure if its a real TCM . Thanks

## 2014-07-02 NOTE — Telephone Encounter (Signed)
Routed this TCM call to appropriate triage pool for TCM follow-up

## 2014-07-02 NOTE — Telephone Encounter (Signed)
Patient contacted regarding discharge from [place] on 07-01-14  Patient understands to follow up with provider laura ingold np on 07/25/14 at [address] Patient understands discharge instructions? yes Patient understands medications and regiment? yes Patient understands to bring all medications to this visit? yes

## 2014-07-25 ENCOUNTER — Encounter: Payer: Self-pay | Admitting: Cardiology

## 2014-07-25 ENCOUNTER — Ambulatory Visit (INDEPENDENT_AMBULATORY_CARE_PROVIDER_SITE_OTHER): Payer: Medicare Other | Admitting: Cardiology

## 2014-07-25 VITALS — BP 116/66 | HR 71 | Ht 70.0 in | Wt 233.1 lb

## 2014-07-25 DIAGNOSIS — I428 Other cardiomyopathies: Secondary | ICD-10-CM

## 2014-07-25 DIAGNOSIS — I471 Supraventricular tachycardia: Secondary | ICD-10-CM

## 2014-07-25 DIAGNOSIS — I255 Ischemic cardiomyopathy: Secondary | ICD-10-CM

## 2014-07-25 DIAGNOSIS — I429 Cardiomyopathy, unspecified: Secondary | ICD-10-CM | POA: Diagnosis not present

## 2014-07-25 MED ORDER — METOPROLOL TARTRATE 25 MG PO TABS: 25.0000 mg | ORAL_TABLET | Freq: Two times a day (BID) | ORAL | Status: DC

## 2014-07-25 MED ORDER — LISINOPRIL 2.5 MG PO TABS: 2.5000 mg | ORAL_TABLET | Freq: Every day | ORAL | Status: DC

## 2014-07-25 NOTE — Progress Notes (Signed)
Cardiology Office Note   Date:  07/25/2014   ID:  JORAN KALLAL, DOB 10/12/1955, MRN [id number]  PCP:  Irven Shelling, MD  Cardiologist:  Dr. Debara Pickett  Chief Complaint  Patient presents with  . Hospitalization Follow-up    new cardiomyopathy, + DOE if long walk      History of Present Illness: KENDRICK HAAPALA is a 59 y.o. male who presents for follow up post hospitalization for significant chest pain and EF found by echo to be 25-30%.  CARDIAC CATH   EF 45% and patent coronary arteries.  He did have an episode during hospitalization of SVT rate of 170. No further episodes that  the pt is aware of.      He will be Dr. Lysbeth Penner cardiac pt.    Today denies chest pain, no SOB except if walks 7 laps at the track. No SOB at rest, no propping his self up at night.  Has been following low salt diet, weight at home has been stable, slightly up here with all his clothes and shoes.  Has seen PCP and is to have lab done in 1 -2 weeks.  He will ask a copy to come to Dr. Debara Pickett.    He has had significant drops in BP on higher doses of ACE.  Currently stable.          Past Medical History  Diagnosis Date  . Gout   . High cholesterol   . Diabetes mellitus     type 2-diet controlled  . History of kidney stones   . Nodule of right lung     lower-being monitored  . Arthritis     Past Surgical History  Procedure Laterality Date  . Joint replacement      rt knee-2008  . Lithotripsy    . Tonsillectomy  age 51  . Nose surgery  1960    from horse accident  . Nasal sinus surgery  1972    reconstruction with tonsils  . Nasal septum surgery  1997  . Knee arthroscopy Bilateral     x2 right, x1 left  . Ankle surgery Right 1970  . Total knee arthroplasty Left 02/25/2014    Procedure: LEFT TOTAL KNEE ARTHROPLASTY;  Surgeon: Mauri Pole, MD;  Location: [facility];  Service: Orthopedics;  Laterality: Left;  . Left heart catheterization with coronary angiogram N/A 06/30/2014    Procedure:  LEFT HEART CATHETERIZATION WITH CORONARY ANGIOGRAM;  Surgeon: Troy Sine, MD;  Location: [facility] C[facility] LAB;  Service: Cardiovascular;  Laterality: N/A;     Current Outpatient Prescriptions  Medication Sig Dispense Refill  . allopurinol (ZYLOPRIM) 300 MG tablet Take 300 mg by mouth at bedtime.     . cholecalciferol (VITAMIN D) 1000 UNITS tablet Take 1,000 Units by mouth at bedtime.     . cyanocobalamin (,VITAMIN B-12,) 1000 MCG/ML injection Inject 1,000 mcg into the muscle every 30 (thirty) days. 2nd Wednesday of the month.    . lisinopril (PRINIVIL,ZESTRIL) 2.5 MG tablet Take 1 tablet (2.5 mg total) by mouth daily. 30 tablet 6  . loratadine (CLARITIN) 10 MG tablet Take 10 mg by mouth at bedtime.     . metoprolol tartrate (LOPRESSOR) 25 MG tablet Take 1 tablet (25 mg total) by mouth 2 (two) times daily. 60 tablet 6  . pramipexole (MIRAPEX) 0.5 MG tablet Take 0.5 mg by mouth daily.    . pravastatin (PRAVACHOL) 40 MG tablet Take 40 mg by mouth at bedtime.     Marland Kitchen  tamsulosin (FLOMAX) 0.4 MG CAPS capsule Take 0.4 mg by mouth daily as needed (kidney stones).      No current facility-administered medications for this visit.    Allergies:   Dilaudid and Penicillins    Social History:  The patient  reports that he quit smoking about 18 years ago. His smoking use included Cigarettes. He quit after 30 years of use. He has never used smokeless tobacco. He reports that he does not drink alcohol or use illicit drugs.   Family History:  The patient's family history includes CAD in his father and mother; Dementia in his mother; Diabetes in his brother; Healthy in his brother, sister, sister, and sister; Heart attack in his father; Heart disease in his mother; Kidney cancer in his brother.    ROS:  General:no colds or fevers, no weight changes Skin:no rashes or ulcers HEENT:no blurred vision, no congestion CV:see HPI PUL:see HPI GI:no diarrhea constipation or melena, no indigestion GU:no hematuria, no  dysuria MS:no joint pain, no claudication Neuro:no syncope, no lightheadedness Endo: + diabetes--stable, no thyroid disease  Wt Readings from Last 3 Encounters:  07/25/14 233 lb 1.9 oz (105.743 kg)  07/01/14 231 lb 11.2 oz (105.098 kg)  02/25/14 242 lb (109.77 kg)     PHYSICAL EXAM: VS:  BP 116/66 mmHg  Pulse 71  Ht 5\' 10"  (1.778 m)  Wt 233 lb 1.9 oz (105.743 kg)  BMI 33.45 kg/m2 , BMI Body mass index is 33.45 kg/(m^2). General:Pleasant affect, NAD Skin:Warm and dry, brisk capillary refill HEENT:normocephalic, sclera clear, mucus membranes moist Neck:supple, no JVD, no bruits  Heart:S1S2 RRR without murmur, gallup, rub or click Lungs:clear without rales, rhonchi, or wheezes ZDG:LOVF, non tender, + BS, do not palpate liver spleen or masses Ext:no lower ext edema, 2+ pedal pulses, 2+ radial pulses Neuro:alert and oriented, MAE, follows commands, + facial symmetry    EKG:  EKG is ordered today. The ekg ordered today demonstrates SR and no acute changes.   Recent Labs: 06/29/2014: ALT 16; B Natriuretic Peptide 58.9 07/01/2014: BUN 11; Creatinine 1.03; Hemoglobin 14.3; Platelets 153; Potassium 4.0; Sodium 136    Lipid Panel    Component Value Date/Time   CHOL 148 06/29/2014 0649   TRIG 136 06/29/2014 0649   HDL 33* 06/29/2014 0649   CHOLHDL 4.5 06/29/2014 0649   VLDL 27 06/29/2014 0649   LDLCALC 88 06/29/2014 0649       Other studies Reviewed: Additional studies/ records that were reviewed today include: hospitalization echo, cath..   ASSESSMENT AND PLAN:  1.  Chest pain resolved, normal coronary arteries on cath.  2.  NICM  EF by Echo 25-30%, by cath 40%, unable to titrate meds today due to borderline BP with hx of significant decrease in BP in past.  Will see back in 4 weeks and with Dr. Debara Pickett in 3 months.  Plan follow up echo in the end of June.  Hopefully we can increase meds on next visit.  Labs per PCP in 1-2 weeks.  Continue to watch diet and call if wt  increases.  Both he and his wife understand.  3.  PSVT during hospitalization, no awareness of any   4. DM 2 per PCP.  5. Recent Lt. TKA   Current medicines are reviewed with the patient today.  The patient Has no concerns regarding medicines.  The following changes have been made:  See above Labs/ tests ordered today include:see above  Disposition:   FU:  see above  Signed, IEPPIR,JJOAC  R, NP  07/25/2014 11:18 AM    [facility] [place], [place], [place] Phone: [phone]; Fax: [phone]

## 2014-07-25 NOTE — Patient Instructions (Signed)
Medication Instructions:  REFILLS OF LISINOPRIL AND LOPRESSOR SENT TO [facility]  Labwork: NONE TODAY  Testing/Procedures:Your physician has requested that you have an echocardiogram.[facility]  Echocardiography is a painless test that uses sound waves to create images of your heart. It provides your doctor with information about the size and shape of your heart and how well your heart's chambers and valves are working. This procedure takes approximately one hour. There are no restrictions for this procedure.   Follow-Up:  FOLLOW UP WITH  IN 4 WEEKS WITH AN AVAILABLE APP AT [address] [facility]ILTY IN 3 MONTHS   Any Other Special Instructions Will Be Listed Below (If Applicable).

## 2014-08-13 ENCOUNTER — Ambulatory Visit (INDEPENDENT_AMBULATORY_CARE_PROVIDER_SITE_OTHER): Payer: Medicare Other | Admitting: Internal Medicine

## 2014-08-13 ENCOUNTER — Encounter: Payer: Self-pay | Admitting: Internal Medicine

## 2014-08-13 VITALS — BP 118/60 | HR 80 | Ht 70.0 in | Wt 231.2 lb

## 2014-08-13 DIAGNOSIS — I471 Supraventricular tachycardia: Secondary | ICD-10-CM | POA: Diagnosis not present

## 2014-08-13 DIAGNOSIS — E1165 Type 2 diabetes mellitus with hyperglycemia: Secondary | ICD-10-CM | POA: Diagnosis not present

## 2014-08-13 DIAGNOSIS — I429 Cardiomyopathy, unspecified: Secondary | ICD-10-CM

## 2014-08-13 DIAGNOSIS — IMO0002 Reserved for concepts with insufficient information to code with codable children: Secondary | ICD-10-CM

## 2014-08-13 DIAGNOSIS — I428 Other cardiomyopathies: Secondary | ICD-10-CM

## 2014-08-13 MED ORDER — LISINOPRIL 2.5 MG PO TABS: 2.5000 mg | ORAL_TABLET | Freq: Two times a day (BID) | ORAL | Status: DC

## 2014-08-13 NOTE — Patient Instructions (Signed)
Your physician has recommended you make the following change in your medication: INCREASE lisinopril to 2.5mg  twice daily   Scheduler - please move echocardiogram to [facility] - needs to be end of June  Your physician recommends that you schedule a follow-up appointment in July with Dr. Debara Pickett.

## 2014-08-13 NOTE — Progress Notes (Signed)
OFFICE NOTE  Chief Complaint:  No complaints  Primary Care Physician: Frederick Shelling, MD  HPI:  Frederick Keller is a 59 y.o. male who presents for follow up post hospitalization for significant chest pain and EF found by echo to be 25-30%. CARDIAC CATH showed EF 45% and patent coronary arteries. He did have an episode during hospitalization of SVT rate of 170. No further episodes that the pt is aware of.It is possible that he is having episodes of SVT which led to a nonischemic cardiomyopathy. He subsequently was started on metoprolol and has been started on lisinopril as well. Overall he feels well and denies any chest pain or worsening shortness of breath. His weight is been stable and monitored on a daily basis. He is not on a diuretic due to compensated symptoms.  PMHx:  Past Medical History  Diagnosis Date  . Gout   . High cholesterol   . Diabetes mellitus     type 2-diet controlled  . History of kidney stones   . Nodule of right lung     lower-being monitored  . Arthritis     Past Surgical History  Procedure Laterality Date  . Joint replacement      rt knee-2008  . Lithotripsy    . Tonsillectomy  age 82  . Nose surgery  1960    from horse accident  . Nasal sinus surgery  1972    reconstruction with tonsils  . Nasal septum surgery  1997  . Knee arthroscopy Bilateral     x2 right, x1 left  . Ankle surgery Right 1970  . Total knee arthroplasty Left 02/25/2014    Procedure: LEFT TOTAL KNEE ARTHROPLASTY;  Surgeon: Frederick Pole, MD;  Location: [facility];  Service: Orthopedics;  Laterality: Left;  . Left heart catheterization with coronary angiogram N/A 06/30/2014    Procedure: LEFT HEART CATHETERIZATION WITH CORONARY ANGIOGRAM;  Surgeon: Frederick Sine, MD;  Location[facility] CATH LAB;  Service: Cardiovascular;  Laterality: N/A;    FAMHx:  Family History  Problem Relation Age of Onset  . CAD Mother   . Heart disease Mother   . Dementia Mother   . CAD Father    . Heart attack Father   . Healthy Sister   . Healthy Brother   . Healthy Sister   . Diabetes Brother   . Kidney cancer Brother   . Healthy Sister     SOCHx:   reports that he quit smoking about 18 years ago. His smoking use included Cigarettes. He quit after 30 years of use. He has never used smokeless tobacco. He reports that he does not drink alcohol or use illicit drugs.  ALLERGIES:  Allergies  Allergen Reactions  . Dilaudid [Hydromorphone Hcl] Nausea And Vomiting  . Penicillins Hives    ROS: A comprehensive review of systems was negative.  HOME MEDS: Current Outpatient Prescriptions  Medication Sig Dispense Refill  . allopurinol (ZYLOPRIM) 300 MG tablet Take 300 mg by mouth at bedtime.     Marland Kitchen aspirin 81 MG tablet Take 81 mg by mouth daily.    . cholecalciferol (VITAMIN D) 1000 UNITS tablet Take 1,000 Units by mouth at bedtime.     . cyanocobalamin (,VITAMIN B-12,) 1000 MCG/ML injection Inject 1,000 mcg into the muscle every 30 (thirty) days. 2nd Wednesday of the month.    . lisinopril (PRINIVIL,ZESTRIL) 2.5 MG tablet Take 1 tablet (2.5 mg total) by mouth 2 (two) times daily. 60 tablet 2  .  loratadine (CLARITIN) 10 MG tablet Take 10 mg by mouth at bedtime.     . metoprolol tartrate (LOPRESSOR) 25 MG tablet Take 1 tablet (25 mg total) by mouth 2 (two) times daily. 60 tablet 6  . pramipexole (MIRAPEX) 0.5 MG tablet Take 0.5 mg by mouth daily.    . pravastatin (PRAVACHOL) 40 MG tablet Take 40 mg by mouth at bedtime.     . tamsulosin (FLOMAX) 0.4 MG CAPS capsule Take 0.4 mg by mouth daily as needed (kidney stones).      No current facility-administered medications for this visit.    LABS/IMAGING: No results found for this or any previous visit (from the past 48 hour(s)). No results found.  WEIGHTS: Wt Readings from Last 3 Encounters:  08/13/14 231 lb 3.2 oz (104.872 kg)  07/25/14 233 lb 1.9 oz (105.743 kg)  07/01/14 231 lb 11.2 oz (105.098 kg)    VITALS: BP 118/60  mmHg  Pulse 80  Ht 5\' 10"  (1.778 m)  Wt 231 lb 3.2 oz (104.872 kg)  BMI 33.17 kg/m2  EXAM: General appearance: alert and no distress Neck: no carotid bruit and no JVD Lungs: clear to auscultation bilaterally Heart: regular rate and rhythm, S1, S2 normal, no murmur, click, rub or gallop Abdomen: soft, non-tender; bowel sounds normal; no masses,  no organomegaly Extremities: extremities normal, atraumatic, no cyanosis or edema Pulses: 2+ and symmetric Skin: Skin color, texture, turgor normal. No rashes or lesions Neurologic: Grossly normal Psych: Normal  EKG: Deferred  ASSESSMENT: 1. Nonischemic cardiomyopathy-EF 30-45%, no significant coronary disease by cardiac catheterization 2. Well compensated with [place] heart association class I symptoms 3. Dyslipidemia  PLAN: 1.   [name] is well compensated in May for a ready had improvement in his ejection fraction. He's not describing any chest pain or worsening shortness of breath. Overall has NYHA class I symptoms. He is not currently on a diuretic and I don't see a reason for that unless his weight goes up or becomes more short of breath. He would benefit from an increase in his heart failure medications. I recommend increasing his lisinopril to 2.5 mg twice daily. He's had a problem with hypotension before on a higher dose of lisinopril. We'll continue his current dose of metoprolol. He is post a have a repeat echo in the end of June. I'll plan to see him back in 2 months and further adjust his medicines at that time.  Frederick Casino, MD, [facility] Attending Cardiologist [place] 08/13/2014, 10:04 AM

## 2014-09-30 ENCOUNTER — Ambulatory Visit (HOSPITAL_COMMUNITY)
Admission: RE | Admit: 2014-09-30 | Discharge: 2014-09-30 | Disposition: A | Discharge status: Home / Self Care | Payer: Medicare Other | Source: Ambulatory Visit | Attending: Cardiovascular Disease | Admitting: Cardiovascular Disease

## 2014-09-30 ENCOUNTER — Other Ambulatory Visit (HOSPITAL_COMMUNITY): Payer: Medicare Other

## 2014-09-30 DIAGNOSIS — I255 Ischemic cardiomyopathy: Secondary | ICD-10-CM

## 2014-10-15 ENCOUNTER — Ambulatory Visit (INDEPENDENT_AMBULATORY_CARE_PROVIDER_SITE_OTHER): Payer: Medicare Other | Admitting: Internal Medicine

## 2014-10-15 ENCOUNTER — Encounter: Payer: Self-pay | Admitting: Internal Medicine

## 2014-10-15 VITALS — BP 130/80 | HR 80 | Ht 70.0 in | Wt 233.0 lb

## 2014-10-15 DIAGNOSIS — I5021 Acute systolic (congestive) heart failure: Secondary | ICD-10-CM | POA: Diagnosis not present

## 2014-10-15 DIAGNOSIS — IMO0002 Reserved for concepts with insufficient information to code with codable children: Secondary | ICD-10-CM

## 2014-10-15 DIAGNOSIS — E1165 Type 2 diabetes mellitus with hyperglycemia: Secondary | ICD-10-CM

## 2014-10-15 DIAGNOSIS — I471 Supraventricular tachycardia: Secondary | ICD-10-CM | POA: Diagnosis not present

## 2014-10-15 DIAGNOSIS — I429 Cardiomyopathy, unspecified: Secondary | ICD-10-CM

## 2014-10-15 DIAGNOSIS — E669 Obesity, unspecified: Secondary | ICD-10-CM

## 2014-10-15 DIAGNOSIS — I428 Other cardiomyopathies: Secondary | ICD-10-CM

## 2014-10-15 MED ORDER — IRBESARTAN 75 MG PO TABS: 75.0000 mg | ORAL_TABLET | Freq: Every day | ORAL | Status: DC

## 2014-10-15 NOTE — Patient Instructions (Signed)
Medication Instructions:   STOP lisinopril  START irbesartan 75mg  once daily for blood pressure   Follow-Up:  6 months with Dr. Debara Pickett - you will receive a reminder letter in the mail approximately 2 months prior to remind you to call to schedule an appointment  Any Other Special Instructions Will Be Listed Below (If Applicable).  Monitor salt intake in diet

## 2014-10-15 NOTE — Progress Notes (Signed)
OFFICE NOTE  Chief Complaint:  Mild cough  Primary Care Physician: Irven Shelling, MD  HPI:  Frederick Keller is a 59 y.o. male who presents for follow up post hospitalization for significant chest pain and EF found by echo to be 25-30%. CARDIAC CATH showed EF 45% and patent coronary arteries. He did have an episode during hospitalization of SVT rate of 170. No further episodes that the pt is aware of.It is possible that he is having episodes of SVT which led to a nonischemic cardiomyopathy. He subsequently was started on metoprolol and has been started on lisinopril as well. Overall he feels well and denies any chest pain or worsening shortness of breath. His weight is been stable and monitored on a daily basis. He is not on a diuretic due to compensated symptoms.  I saw Frederick Keller in back in the office today. He reports feeling well except he does have a dry, nagging cough. He is on lisinopril 2.5 mg twice daily. A repeat echo recently which was a limited study does indicate EF is improved now up to 50-55%. This is reassuring and I've encouraged him to continue to stay on his heart failure medications. In fact at this point I feel there is room to increase the dose however I'm concerned about possible ACE inhibitor side effects.   PMHx:  Past Medical History  Diagnosis Date  . Gout   . High cholesterol   . Diabetes mellitus     type 2-diet controlled  . History of kidney stones   . Nodule of right lung     lower-being monitored  . Arthritis     Past Surgical History  Procedure Laterality Date  . Joint replacement      rt Keller-2008  . Lithotripsy    . Tonsillectomy  age 36  . Nose surgery  1960    from horse accident  . Nasal sinus surgery  1972    reconstruction with tonsils  . Nasal septum surgery  1997  . Keller arthroscopy Bilateral     x2 right, x1 left  . Ankle surgery Right 1970  . Total Keller arthroplasty Left 02/25/2014    Procedure: LEFT TOTAL Keller  ARTHROPLASTY;  Surgeon: Mauri Pole, MD;  Location: [facility];  Service: Orthopedics;  Laterality: Left;  . Left heart catheterization with coronary angiogram N/A 06/30/2014    Procedure: LEFT HEART CATHETERIZATION WITH CORONARY ANGIOGRAM;  Surgeon: Troy Sine, MD;  Location: [facility] CATH LAB;  Service: Cardiovascular;  Laterality: N/A;    FAMHx:  Family History  Problem Relation Age of Onset  . CAD Mother   . Heart disease Mother   . Dementia Mother   . CAD Father   . Heart attack Father   . Healthy Sister   . Healthy Brother   . Healthy Sister   . Diabetes Brother   . Kidney cancer Brother   . Healthy Sister     SOCHx:   reports that he quit smoking about 18 years ago. His smoking use included Cigarettes. He quit after 30 years of use. He has never used smokeless tobacco. He reports that he does not drink alcohol or use illicit drugs.  ALLERGIES:  Allergies  Allergen Reactions  . Dilaudid [Hydromorphone Hcl] Nausea And Vomiting  . Penicillins Hives  . Lisinopril Cough    ROS: A comprehensive review of systems was negative except for: Respiratory: positive for cough  HOME MEDS: Current Outpatient Prescriptions  Medication Sig Dispense  Refill  . allopurinol (ZYLOPRIM) 300 MG tablet Take 300 mg by mouth at bedtime.     Marland Kitchen aspirin 81 MG tablet Take 81 mg by mouth daily.    . cholecalciferol (VITAMIN D) 1000 UNITS tablet Take 1,000 Units by mouth at bedtime.     . cyanocobalamin (,VITAMIN B-12,) 1000 MCG/ML injection Inject 1,000 mcg into the muscle every 30 (thirty) days. 2nd Wednesday of the month.    . loratadine (CLARITIN) 10 MG tablet Take 10 mg by mouth at bedtime.     . metoprolol tartrate (LOPRESSOR) 25 MG tablet Take 1 tablet (25 mg total) by mouth 2 (two) times daily. 60 tablet 6  . pramipexole (MIRAPEX) 0.5 MG tablet Take 0.5 mg by mouth daily.    . pravastatin (PRAVACHOL) 40 MG tablet Take 40 mg by mouth at bedtime.     . tamsulosin (FLOMAX) 0.4 MG CAPS capsule  Take 0.4 mg by mouth daily as needed (kidney stones).     . irbesartan (AVAPRO) 75 MG tablet Take 1 tablet (75 mg total) by mouth daily. 30 tablet 6   No current facility-administered medications for this visit.    LABS/IMAGING: No results found for this or any previous visit (from the past 48 hour(s)). No results found.  WEIGHTS: Wt Readings from Last 3 Encounters:  10/15/14 233 lb (105.688 kg)  08/13/14 231 lb 3.2 oz (104.872 kg)  07/25/14 233 lb 1.9 oz (105.743 kg)    VITALS: BP 130/80 mmHg  Pulse 80  Ht 5\' 10"  (1.778 m)  Wt 233 lb (105.688 kg)  BMI 33.43 kg/m2  EXAM: General appearance: alert and no distress Neck: no carotid bruit and no JVD Lungs: clear to auscultation bilaterally Heart: regular rate and rhythm, S1, S2 normal, no murmur, click, rub or gallop Abdomen: soft, non-tender; bowel sounds normal; no masses,  no organomegaly Extremities: extremities normal, atraumatic, no cyanosis or edema Pulses: 2+ and symmetric Skin: Skin color, texture, turgor normal. No rashes or lesions Neurologic: Grossly normal Psych: Normal  EKG: Deferred  ASSESSMENT: 1. Nonischemic cardiomyopathy- EF now 50-55% by echo 2. Well compensated with [place] heart association class I symptoms 3. Dyslipidemia 4. Obesity with poor diet  PLAN: 1.   Frederick Keller has had improvement in EF up to 50-55%. He is reporting some dry, nonproductive cough which may be related to lisinopril. We will go ahead and changes over to irbesartan 75 mg daily. If his symptoms go away then I suspect this is related to the ACE inhibitor. He has had improvement in EF and should continue to work on weight loss and dietary changes. He did tell main episode where he felt somewhat faint driving back from the beach. They stopped to get a cheeseburger and french fries, which he should understand is not compatible with a heart failure diet. We talked about the need to continue to watch sodium intake despite the  improvement in his EF. It's unclear what led to this episode of feeling faint however he's not had any further episodes. I've asked him to contact me if he has any other symptoms. Hopefully we'll get rid of his cough with the change in his medicine. Plan to see him back in 6 months.  Pixie Casino, MD, [facility] Attending Cardiologist [place] 10/15/2014, 1:04 PM

## 2014-10-24 ENCOUNTER — Telehealth: Payer: Self-pay | Admitting: Internal Medicine

## 2014-10-24 NOTE — Telephone Encounter (Signed)
Spoke with Melissa from [facility] and provided last OV diagnosis of acute systolic congestive heart failure and nonischemic cardiomyopathy, last EF and date and last BP and HR

## 2014-11-14 ENCOUNTER — Other Ambulatory Visit: Payer: Self-pay | Admitting: *Deleted

## 2014-12-31 ENCOUNTER — Other Ambulatory Visit: Payer: Self-pay | Admitting: Internal Medicine

## 2014-12-31 MED ORDER — LISINOPRIL 2.5 MG PO TABS: 2.5000 mg | ORAL_TABLET | Freq: Every day | ORAL | Status: DC

## 2015-04-14 ENCOUNTER — Ambulatory Visit: Payer: Medicare Other | Admitting: Internal Medicine

## 2015-05-07 ENCOUNTER — Encounter: Payer: Self-pay | Admitting: Internal Medicine

## 2015-05-07 ENCOUNTER — Ambulatory Visit (INDEPENDENT_AMBULATORY_CARE_PROVIDER_SITE_OTHER): Payer: Medicare Other | Admitting: Internal Medicine

## 2015-05-07 VITALS — BP 102/70 | HR 64 | Ht 70.0 in | Wt 239.3 lb

## 2015-05-07 DIAGNOSIS — I429 Cardiomyopathy, unspecified: Secondary | ICD-10-CM

## 2015-05-07 DIAGNOSIS — E669 Obesity, unspecified: Secondary | ICD-10-CM | POA: Diagnosis not present

## 2015-05-07 DIAGNOSIS — I471 Supraventricular tachycardia: Secondary | ICD-10-CM | POA: Diagnosis not present

## 2015-05-07 DIAGNOSIS — I428 Other cardiomyopathies: Secondary | ICD-10-CM

## 2015-05-07 MED ORDER — METOPROLOL TARTRATE 25 MG PO TABS: 25.0000 mg | ORAL_TABLET | Freq: Two times a day (BID) | ORAL | Status: DC

## 2015-05-07 MED ORDER — LISINOPRIL 2.5 MG PO TABS: 2.5000 mg | ORAL_TABLET | Freq: Every day | ORAL | Status: DC

## 2015-05-07 NOTE — Progress Notes (Signed)
OFFICE NOTE  Chief Complaint:  No complaints  Primary Care Physician: Irven Shelling, MD  HPI:  Frederick Keller is a 60 y.o. male who presents for follow up post hospitalization for significant chest pain and EF found by echo to be 25-30%. CARDIAC CATH showed EF 45% and patent coronary arteries. He did have an episode during hospitalization of SVT rate of 170. No further episodes that the pt is aware of.It is possible that he is having episodes of SVT which led to a nonischemic cardiomyopathy. He subsequently was started on metoprolol and has been started on lisinopril as well. Overall he feels well and denies any chest pain or worsening shortness of breath. His weight is been stable and monitored on a daily basis. He is not on a diuretic due to compensated symptoms.  I saw Frederick Keller in back in the office today. He reports feeling well except he does have a dry, nagging cough. He is on lisinopril 2.5 mg twice daily. A repeat echo recently which was a limited study does indicate EF is improved now up to 50-55%. This is reassuring and I've encouraged him to continue to stay on his heart failure medications. In fact at this point I feel there is room to increase the dose however I'm concerned about possible ACE inhibitor side effects.   Frederick Keller returns today for follow-up. He's had no recurrent SVT. He denies any palpitations or chest pain. He has no shortness of breath. He was having cough and I switched him off of lisinopril to an ARB. He thinks that medicine made him cough more although it may be some other reason he was having cough because he stopped it and eventually the cough went away. He is now gone back to lisinopril and he is having no side effects from the medicine. Blood pressure is low normal today but he is asymptomatic with it. Generally feels well. EF is remain stable and is at 50-55%.  PMHx:  Past Medical History  Diagnosis Date  . Gout   . High cholesterol   .  Diabetes mellitus     type 2-diet controlled  . History of kidney stones   . Nodule of right lung     lower-being monitored  . Arthritis     Past Surgical History  Procedure Laterality Date  . Joint replacement      rt knee-2008  . Lithotripsy    . Tonsillectomy  age 89  . Nose surgery  1960    from horse accident  . Nasal sinus surgery  1972    reconstruction with tonsils  . Nasal septum surgery  1997  . Knee arthroscopy Bilateral     x2 right, x1 left  . Ankle surgery Right 1970  . Total knee arthroplasty Left 02/25/2014    Procedure: LEFT TOTAL KNEE ARTHROPLASTY;  Surgeon: Mauri Pole, MD;  Location: [facility];  Service: Orthopedics;  Laterality: Left;  . Left heart catheterization with coronary angiogram N/A 06/30/2014    Procedure: LEFT HEART CATHETERIZATION WITH CORONARY ANGIOGRAM;  Surgeon: Troy Sine, MD;  Location: [facility] CATH LAB;  Service: Cardiovascular;  Laterality: N/A;    FAMHx:  Family History  Problem Relation Age of Onset  . CAD Mother   . Heart disease Mother   . Dementia Mother   . CAD Father   . Heart attack Father   . Healthy Sister   . Healthy Brother   . Healthy Sister   . Diabetes  Brother   . Kidney cancer Brother   . Healthy Sister     SOCHx:   reports that he quit smoking about 19 years ago. His smoking use included Cigarettes. He quit after 30 years of use. He has never used smokeless tobacco. He reports that he does not drink alcohol or use illicit drugs.  ALLERGIES:  Allergies  Allergen Reactions  . Dilaudid [Hydromorphone Hcl] Nausea And Vomiting  . Penicillins Hives    ROS: Pertinent items noted in HPI and remainder of comprehensive ROS otherwise negative.  HOME MEDS: Current Outpatient Prescriptions  Medication Sig Dispense Refill  . allopurinol (ZYLOPRIM) 300 MG tablet Take 300 mg by mouth at bedtime.     Marland Kitchen aspirin 81 MG tablet Take 81 mg by mouth daily.    . cholecalciferol (VITAMIN D) 1000 UNITS tablet Take 1,000 Units  by mouth at bedtime.     . cyanocobalamin (,VITAMIN B-12,) 1000 MCG/ML injection Inject 1,000 mcg into the muscle every 30 (thirty) days. 2nd Wednesday of the month.    . lisinopril (PRINIVIL,ZESTRIL) 2.5 MG tablet Take 1 tablet (2.5 mg total) by mouth daily. 90 tablet 3  . loratadine (CLARITIN) 10 MG tablet Take 10 mg by mouth at bedtime.     . metoprolol tartrate (LOPRESSOR) 25 MG tablet Take 1 tablet (25 mg total) by mouth 2 (two) times daily. 180 tablet 3  . pravastatin (PRAVACHOL) 40 MG tablet Take 40 mg by mouth at bedtime.     . tamsulosin (FLOMAX) 0.4 MG CAPS capsule Take 0.4 mg by mouth daily as needed (kidney stones).      No current facility-administered medications for this visit.    LABS/IMAGING: No results found for this or any previous visit (from the past 48 hour(s)). No results found.  WEIGHTS: Wt Readings from Last 3 Encounters:  05/07/15 239 lb 4.8 oz (108.546 kg)  10/15/14 233 lb (105.688 kg)  08/13/14 231 lb 3.2 oz (104.872 kg)    VITALS: BP 102/70 mmHg  Pulse 64  Ht 5\' 10"  (1.778 m)  Wt 239 lb 4.8 oz (108.546 kg)  BMI 34.34 kg/m2  EXAM: General appearance: alert and no distress Neck: no carotid bruit and no JVD Lungs: clear to auscultation bilaterally Heart: regular rate and rhythm, S1, S2 normal, no murmur, click, rub or gallop Abdomen: soft, non-tender; bowel sounds normal; no masses,  no organomegaly Extremities: extremities normal, atraumatic, no cyanosis or edema Pulses: 2+ and symmetric Skin: Skin color, texture, turgor normal. No rashes or lesions Neurologic: Grossly normal Psych: Normal  EKG: Sinus rhythm at 64  ASSESSMENT: 1. Nonischemic cardiomyopathy- EF now 50-55% by echo 2. Well compensated with [place] heart association class I symptoms 3. Dyslipidemia 4. Obesity with poor diet  PLAN: 1.   [name] is doing well without any symptoms. He denies chest pain or shortness of breath. His weight has been stable. He's had no  tachyarrhythmias. Blood pressure is low normal but well tolerated. He does not have an ACE inhibitor allergy and we have removed that from his list. He is on low-dose lisinopril and metoprolol. He'll need a repeat check of his echocardiogram in one year. His cholesterol is followed by his primary care provider and is on low-dose aspirin. Plan to see him back annually or sooner as necessary.  Pixie Casino, MD, [facility] Attending Cardiologist [place] 05/07/2015, 1:46 PM

## 2015-05-07 NOTE — Patient Instructions (Signed)
Dr Hilty recommends that you schedule a follow-up appointment in 1 year. You will receive a reminder letter in the mail two months in advance. If you don't receive a letter, please call our office to schedule the follow-up appointment.  If you need a refill on your cardiac medications before your next appointment, please call your pharmacy. 

## 2015-06-28 ENCOUNTER — Encounter (HOSPITAL_COMMUNITY): Payer: Self-pay

## 2015-06-28 ENCOUNTER — Emergency Department (HOSPITAL_COMMUNITY): Payer: Medicare Other

## 2015-06-28 ENCOUNTER — Emergency Department (HOSPITAL_COMMUNITY)
Admission: EM | Admit: 2015-06-28 | Discharge: 2015-06-29 | Disposition: A | Discharge status: Home / Self Care | Payer: Medicare Other | Attending: Physician Assistant | Admitting: Physician Assistant

## 2015-06-28 DIAGNOSIS — M109 Gout, unspecified: Secondary | ICD-10-CM | POA: Diagnosis not present

## 2015-06-28 DIAGNOSIS — M1990 Unspecified osteoarthritis, unspecified site: Secondary | ICD-10-CM | POA: Insufficient documentation

## 2015-06-28 DIAGNOSIS — E119 Type 2 diabetes mellitus without complications: Secondary | ICD-10-CM | POA: Diagnosis not present

## 2015-06-28 DIAGNOSIS — Z880 Allergy status to penicillin: Secondary | ICD-10-CM | POA: Diagnosis not present

## 2015-06-28 DIAGNOSIS — Z87442 Personal history of urinary calculi: Secondary | ICD-10-CM | POA: Insufficient documentation

## 2015-06-28 DIAGNOSIS — Z7982 Long term (current) use of aspirin: Secondary | ICD-10-CM | POA: Diagnosis not present

## 2015-06-28 DIAGNOSIS — Z79899 Other long term (current) drug therapy: Secondary | ICD-10-CM | POA: Diagnosis not present

## 2015-06-28 DIAGNOSIS — E7800 Pure hypercholesterolemia, unspecified: Secondary | ICD-10-CM | POA: Diagnosis not present

## 2015-06-28 DIAGNOSIS — Z87891 Personal history of nicotine dependence: Secondary | ICD-10-CM | POA: Diagnosis not present

## 2015-06-28 DIAGNOSIS — M542 Cervicalgia: Secondary | ICD-10-CM | POA: Diagnosis present

## 2015-06-28 DIAGNOSIS — R51 Headache: Secondary | ICD-10-CM | POA: Insufficient documentation

## 2015-06-28 DIAGNOSIS — Z9889 Other specified postprocedural states: Secondary | ICD-10-CM | POA: Insufficient documentation

## 2015-06-28 MED ORDER — ONDANSETRON HCL 4 MG/2ML IJ SOLN
4.0000 mg | Freq: Once | INTRAMUSCULAR | Status: AC
  Administered 2015-06-28: 4 mg via INTRAVENOUS
  Filled 2015-06-28: qty 2

## 2015-06-28 MED ORDER — FENTANYL CITRATE (PF) 100 MCG/2ML IJ SOLN
50.0000 ug | Freq: Once | INTRAMUSCULAR | Status: AC
  Administered 2015-06-28: 50 ug via INTRAVENOUS
  Filled 2015-06-28: qty 2

## 2015-06-28 MED ORDER — DIAZEPAM 5 MG/ML IJ SOLN
5.0000 mg | Freq: Once | INTRAMUSCULAR | Status: AC
  Administered 2015-06-28: 5 mg via INTRAVENOUS
  Filled 2015-06-28: qty 2

## 2015-06-28 NOTE — ED Notes (Signed)
Pt reports sitting in church and got severe occipital headache.  Painful when trying to turn head side to side or trying to touch chin to chest.  Pt reports when he laid in bed this afternoon and head touched pillow he felt nauseated and dizzy.  No fevers, cold/cough, vomiting.

## 2015-06-28 NOTE — ED Notes (Signed)
PA at bedside.

## 2015-06-29 ENCOUNTER — Encounter (HOSPITAL_COMMUNITY): Payer: Self-pay | Admitting: Radiology

## 2015-06-29 ENCOUNTER — Emergency Department (HOSPITAL_COMMUNITY): Payer: Medicare Other

## 2015-06-29 LAB — CBC WITH DIFFERENTIAL/PLATELET
Basophils Absolute: 0 10*3/uL (ref 0.0–0.1)
Basophils Relative: 0 %
Eosinophils Absolute: 0.1 10*3/uL (ref 0.0–0.7)
Eosinophils Relative: 2 %
HEMATOCRIT: 43 % (ref 39.0–52.0)
HEMOGLOBIN: 14 g/dL (ref 13.0–17.0)
LYMPHS ABS: 1.9 10*3/uL (ref 0.7–4.0)
LYMPHS PCT: 23 %
MCH: 32.3 pg (ref 26.0–34.0)
MCHC: 32.6 g/dL (ref 30.0–36.0)
MCV: 99.1 fL (ref 78.0–100.0)
MONOS PCT: 9 %
Monocytes Absolute: 0.8 10*3/uL (ref 0.1–1.0)
NEUTROS ABS: 5.4 10*3/uL (ref 1.7–7.7)
NEUTROS PCT: 66 %
Platelets: 127 10*3/uL — ABNORMAL LOW (ref 150–400)
RBC: 4.34 MIL/uL (ref 4.22–5.81)
RDW: 13.4 % (ref 11.5–15.5)
WBC: 8.2 10*3/uL (ref 4.0–10.5)

## 2015-06-29 LAB — BASIC METABOLIC PANEL
Anion gap: 9 (ref 5–15)
BUN: 11 mg/dL (ref 6–20)
CHLORIDE: 104 mmol/L (ref 101–111)
CO2: 28 mmol/L (ref 22–32)
CREATININE: 1.02 mg/dL (ref 0.61–1.24)
Calcium: 9.4 mg/dL (ref 8.9–10.3)
GFR calc non Af Amer: >60 mL/min (ref >60)
Glucose, Bld: 105 mg/dL — ABNORMAL HIGH (ref 65–99)
Potassium: 4.3 mmol/L (ref 3.5–5.1)
Sodium: 141 mmol/L (ref 135–145)

## 2015-06-29 MED ORDER — IOHEXOL 300 MG/ML  SOLN
75.0000 mL | Freq: Once | INTRAMUSCULAR | Status: AC | PRN
  Administered 2015-06-29: 75 mL via INTRAVENOUS

## 2015-06-29 MED ORDER — IBUPROFEN 800 MG PO TABS: 800.0000 mg | ORAL_TABLET | Freq: Three times a day (TID) | ORAL | Status: DC

## 2015-06-29 MED ORDER — DIAZEPAM 5 MG PO TABS: 5.0000 mg | ORAL_TABLET | Freq: Two times a day (BID) | ORAL | Status: DC

## 2015-06-29 MED ORDER — PROCHLORPERAZINE EDISYLATE 5 MG/ML IJ SOLN: 10.0000 mg | Freq: Once | INTRAMUSCULAR | Status: DC

## 2015-06-29 MED ORDER — OXYCODONE-ACETAMINOPHEN 5-325 MG PO TABS: 1.0000 | ORAL_TABLET | Freq: Four times a day (QID) | ORAL | Status: DC | PRN

## 2015-06-29 NOTE — ED Provider Notes (Signed)
CSN: [id number]     Arrival date & time 06/28/15  2038 History   First MD Initiated Contact with Patient 06/28/15 2237     Chief Complaint  Patient presents with  . Headache  . Neck Pain     (Consider location/radiation/quality/duration/timing/severity/associated sxs/prior Treatment) The history is provided by the patient and medical records. No language interpreter was used.     Frederick Keller is a 60 y.o. male  with a hx of gout, high cholesterol, non-insulin-dependent diabetes presents to the Emergency Department complaining of gradual, persistent, progressively worsening posterior neck pain onset limiting this morning while in church. Patient reports the pain is 10/10, sharp and exquisite with range of motion.  No treatments prior to arrival.  He reports he is unable to turn his head from side to side, flex or extend due to pain.  He reports that any pressure to the posterior neck or head except he'll nauseated and lightheaded. No vision changes, spinning sensation, vomiting, diaphoresis, numbness, weakness, slurred speech, rash. Patient denies recent travel, sick contacts.   URI symptoms including cough or cold symptoms, rhinorrhea. No fevers at home. No tachycardia or palpitations.  No alleviating factors.    Past Medical History  Diagnosis Date  . Gout   . High cholesterol   . Diabetes mellitus     type 2-diet controlled  . History of kidney stones   . Nodule of right lung     lower-being monitored  . Arthritis    Past Surgical History  Procedure Laterality Date  . Joint replacement      rt knee-2008  . Lithotripsy    . Tonsillectomy  age 39  . Nose surgery  1960    from horse accident  . Nasal sinus surgery  1972    reconstruction with tonsils  . Nasal septum surgery  1997  . Knee arthroscopy Bilateral     x2 right, x1 left  . Ankle surgery Right 1970  . Total knee arthroplasty Left 02/25/2014    Procedure: LEFT TOTAL KNEE ARTHROPLASTY;  Surgeon: Mauri Pole, MD;   Location: [facility];  Service: Orthopedics;  Laterality: Left;  . Left heart catheterization with coronary angiogram N/A 06/30/2014    Procedure: LEFT HEART CATHETERIZATION WITH CORONARY ANGIOGRAM;  Surgeon: Troy Sine, MD;  Location: [facility] CATH LAB;  Service: Cardiovascular;  Laterality: N/A;   Family History  Problem Relation Age of Onset  . CAD Mother   . Heart disease Mother   . Dementia Mother   . CAD Father   . Heart attack Father   . Healthy Sister   . Healthy Brother   . Healthy Sister   . Diabetes Brother   . Kidney cancer Brother   . Healthy Sister    Social History  Substance Use Topics  . Smoking status: Former Smoker -- 30 years    Types: Cigarettes    Quit date: 04/04/1996  . Smokeless tobacco: Never Used  . Alcohol Use: No    Review of Systems  Constitutional: Negative for fever, diaphoresis, appetite change, fatigue and unexpected weight change.  HENT: Negative for mouth sores.   Eyes: Negative for visual disturbance.  Respiratory: Negative for cough, chest tightness, shortness of breath and wheezing.   Cardiovascular: Negative for chest pain.  Gastrointestinal: Negative for nausea, vomiting, abdominal pain, diarrhea and constipation.  Endocrine: Negative for polydipsia, polyphagia and polyuria.  Genitourinary: Negative for dysuria, urgency, frequency and hematuria.  Musculoskeletal: Positive for neck pain. Negative for  back pain and neck stiffness.  Skin: Negative for rash.  Allergic/Immunologic: Negative for immunocompromised state.  Neurological: Positive for headaches. Negative for syncope and light-headedness.  Hematological: Does not bruise/bleed easily.  Psychiatric/Behavioral: Negative for sleep disturbance. The patient is not nervous/anxious.       Allergies  Dilaudid and Penicillins  Home Medications   Prior to Admission medications   Medication Sig Start Date End Date Taking? Authorizing Provider  allopurinol (ZYLOPRIM) 300 MG tablet Take  300 mg by mouth at bedtime.    Yes Historical Provider, MD  aspirin 81 MG tablet Take 81 mg by mouth daily.   Yes Historical Provider, MD  cholecalciferol (VITAMIN D) 1000 UNITS tablet Take 1,000 Units by mouth at bedtime.    Yes Historical Provider, MD  cyanocobalamin (,VITAMIN B-12,) 1000 MCG/ML injection Inject 1,000 mcg into the muscle every 30 (thirty) days. 2nd Wednesday of the month.   Yes Historical Provider, MD  lisinopril (PRINIVIL,ZESTRIL) 2.5 MG tablet Take 1 tablet (2.5 mg total) by mouth daily. 05/07/15  Yes Pixie Casino, MD  loratadine (CLARITIN) 10 MG tablet Take 10 mg by mouth at bedtime.    Yes Historical Provider, MD  metoprolol tartrate (LOPRESSOR) 25 MG tablet Take 1 tablet (25 mg total) by mouth 2 (two) times daily. 05/07/15  Yes Pixie Casino, MD  pravastatin (PRAVACHOL) 40 MG tablet Take 40 mg by mouth at bedtime.    Yes Historical Provider, MD  tamsulosin (FLOMAX) 0.4 MG CAPS capsule Take 0.4 mg by mouth daily as needed (kidney stones).    Yes Historical Provider, MD   BP 135/84 mmHg  Pulse 65  Temp(Src) 98.1 F (36.7 C) (Oral)  Resp 16  Ht 5' 10.5" (1.791 m)  Wt 110.451 kg  BMI 34.43 kg/m2  SpO2 96% Physical Exam  Constitutional: He is oriented to person, place, and time. He appears well-developed and well-nourished. No distress.  HENT:  Head: Normocephalic and atraumatic.  Mouth/Throat: Oropharynx is clear and moist.  Eyes: Conjunctivae and EOM are normal. Pupils are equal, round, and reactive to light. No scleral icterus.  No horizontal, vertical or rotational nystagmus  Neck: Normal range of motion. Neck supple.  Significantly decreased ROM especially with flexion extension of the cervical spine Significant midline and paraspinal tenderness beginning at C3 and extending upward into the occiput No erythema, induration or increased warmth to the area; no gross abscess, laceration or break to the skin No deformity or step-off  Cardiovascular: Normal rate,  regular rhythm, normal heart sounds and intact distal pulses.   No murmur heard. Pulmonary/Chest: Effort normal and breath sounds normal. No respiratory distress. He has no wheezes. He has no rales.  Abdominal: Soft. Bowel sounds are normal. There is no tenderness. There is no rebound and no guarding.  Musculoskeletal: Normal range of motion.  Lymphadenopathy:    He has no cervical adenopathy.  Neurological: He is alert and oriented to person, place, and time. He has normal reflexes. No cranial nerve deficit. He exhibits normal muscle tone. Coordination normal.  Mental Status:  Alert, oriented, thought content appropriate. Speech fluent without evidence of aphasia. Able to follow 2 step commands without difficulty.  Cranial Nerves:  II:  Peripheral visual fields grossly normal, pupils equal, round, reactive to light III,IV, VI: ptosis not present, extra-ocular motions intact bilaterally  V,VII: smile symmetric, facial light touch sensation equal VIII: hearing grossly normal bilaterally  IX,X: midline uvula rise  XI: bilateral shoulder shrug equal and strong XII: midline tongue extension  Motor:  5/5 in upper and lower extremities bilaterally including strong and equal grip strength and dorsiflexion/plantar flexion Sensory: Pinprick and light touch normal in all extremities.  Deep Tendon Reflexes: 2+ and symmetric  Cerebellar: normal finger-to-nose with bilateral upper extremities Gait: normal gait and balance CV: distal pulses palpable throughout   Skin: Skin is warm and dry. No rash noted. He is not diaphoretic.  Psychiatric: He has a normal mood and affect. His behavior is normal. Judgment and thought content normal.  Nursing note and vitals reviewed.   ED Course  Procedures (including critical care time) Labs Review Labs Reviewed  CBC WITH DIFFERENTIAL/PLATELET - Abnormal; Notable for the following:    Platelets 127 (*)    All other components within normal limits  BASIC  METABOLIC PANEL - Abnormal; Notable for the following:    Glucose, Bld 105 (*)    All other components within normal limits    MDM   Final diagnoses:  Neck pain    Cindy Hazy presents with posterior neck pain.  No fevers at home and patient is afebrile here. No evidence of infection.  Patient does have significant neck stiffness but there are no other signs of infection. No leukocytosis.  No additional evidence of meningitis at this time do not believe the patient needs a lumbar puncture.  CT scan of head and neck is pending.  He is neurologically intact.  Patient reports some improvement after medications. Continue to monitor.  1:49 AM  At shift change, pt care transferred to [facility] who will follow CT scans.  If normal, anticipate discharge home with symptomatic therapies.    Jarrett Soho Kiernan Atkerson, PA-C 06/29/15 [place], MD 06/29/15 [phone]

## 2015-06-29 NOTE — Discharge Instructions (Signed)
We are sorry about your neck pain. We think this likely muscle spasm. We'll give any medications to help with this. Please take ibuprofen and Valium to help with muscle relaxants and the inflammation. He can use Percocet to help with pain as a breakthrough. Please return with any fevers or other symptoms.

## 2015-08-04 ENCOUNTER — Other Ambulatory Visit: Payer: Self-pay | Admitting: *Deleted

## 2015-08-04 MED ORDER — LISINOPRIL 2.5 MG PO TABS: 2.5000 mg | ORAL_TABLET | Freq: Every day | ORAL | Status: DC

## 2015-11-19 IMAGING — CR DG ABDOMEN 1V
1 series · 1 of 1 positions shown · non-contrast
Comparison: CT abdomen 08/09/2013, radiograph 08/29/2013

CLINICAL DATA: Pre litho.  Left-sided stone.

EXAM:
ABDOMEN - 1 VIEW

[t abdomen supine]
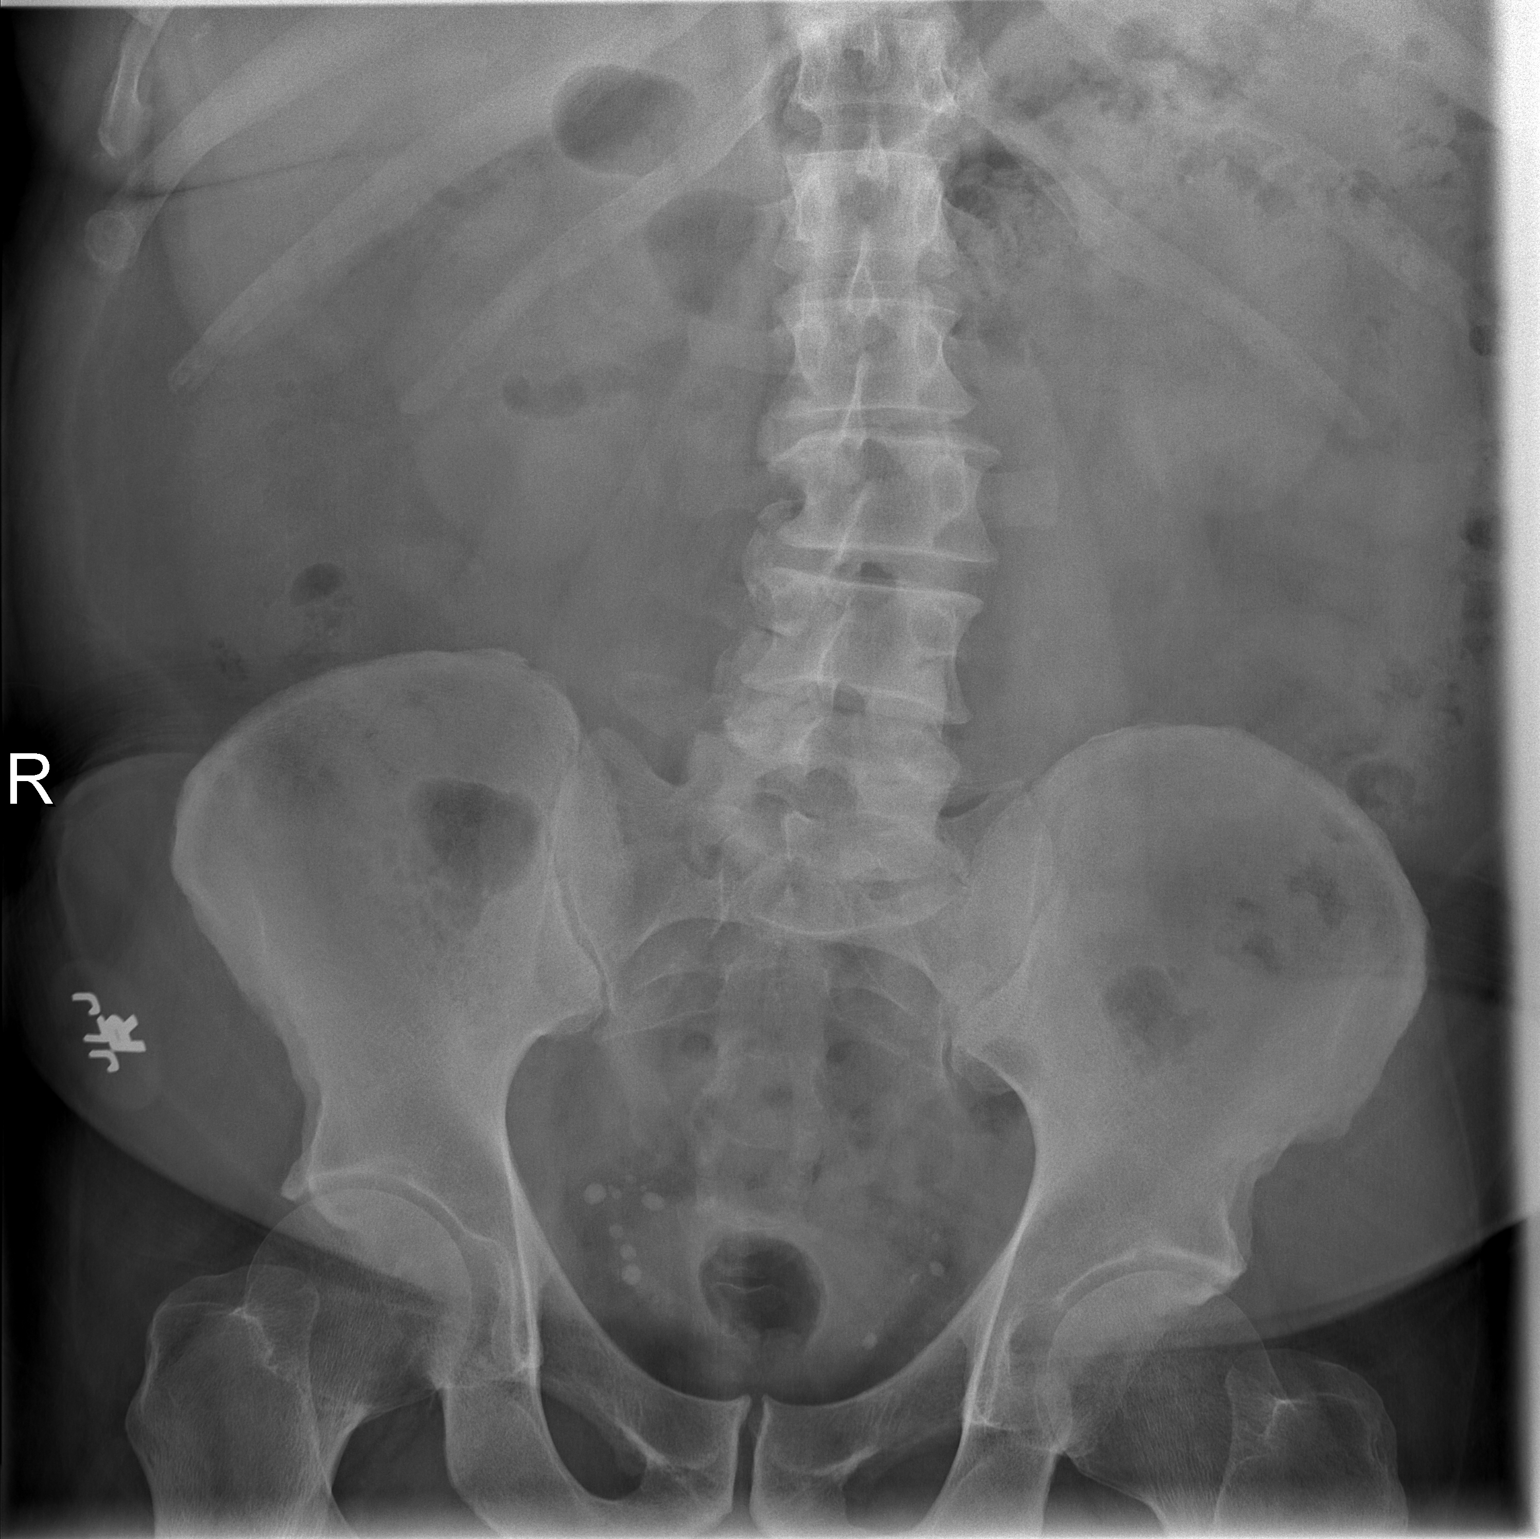

[1 of 1 positions shown; findings below may reference images not displayed]

FINDINGS: Small stone in the mid left ureter is questionably seen on the
current study. Correlate with symptoms. No other renal calculi.

Phleboliths in the pelvis.  Normal bowel gas pattern.
IMPRESSION: Small stone mid left ureter is questionably present on the current
study.

## 2015-11-22 IMAGING — US US RENAL
1 series · 14 of 25 positions shown · non-contrast
Comparison: None.

CLINICAL DATA: Flank pain

EXAM:
RENAL/URINARY TRACT ULTRASOUND COMPLETE

[Series 1: us renal · 0.24mm/px · 14 of 33 slices shown]
[im 1/33]
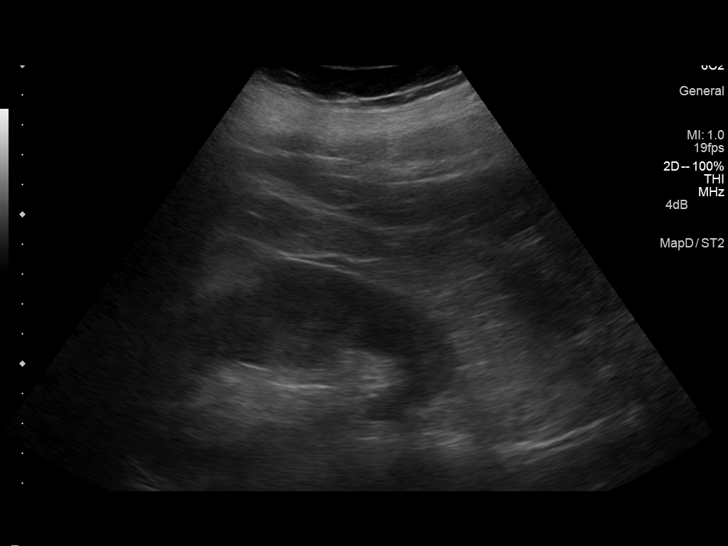
[im 3/33]
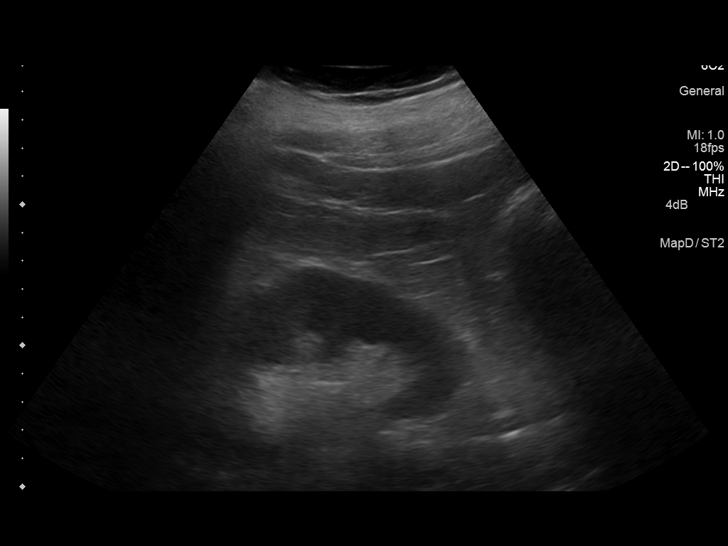
[im 6/33]
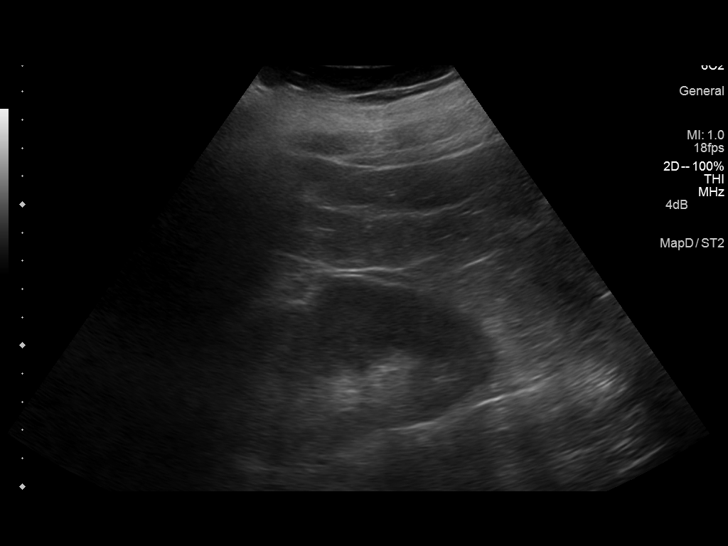
[im 9/33]
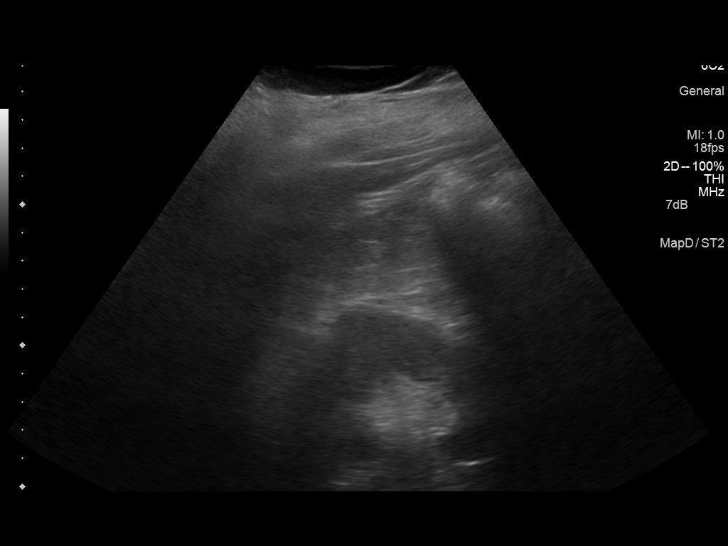
[im 11/33]
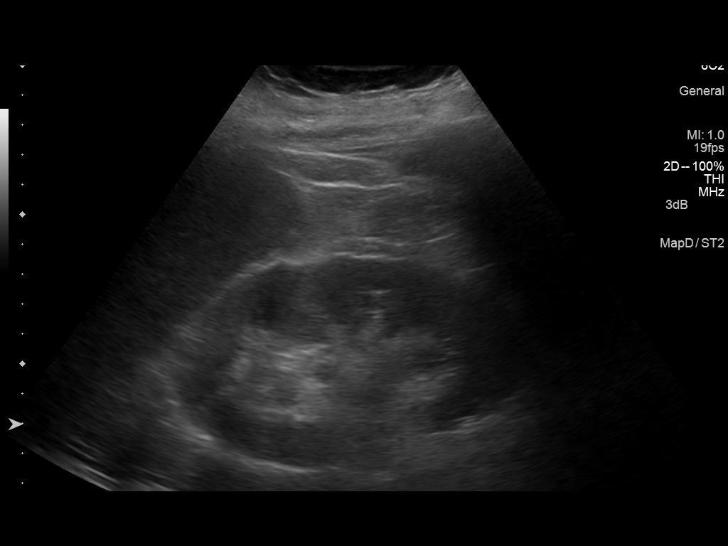
[im 13/33]
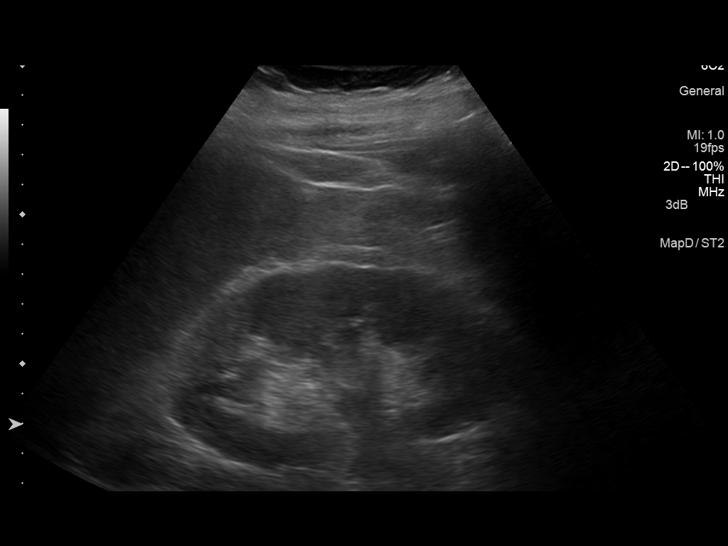
[im 15/33]
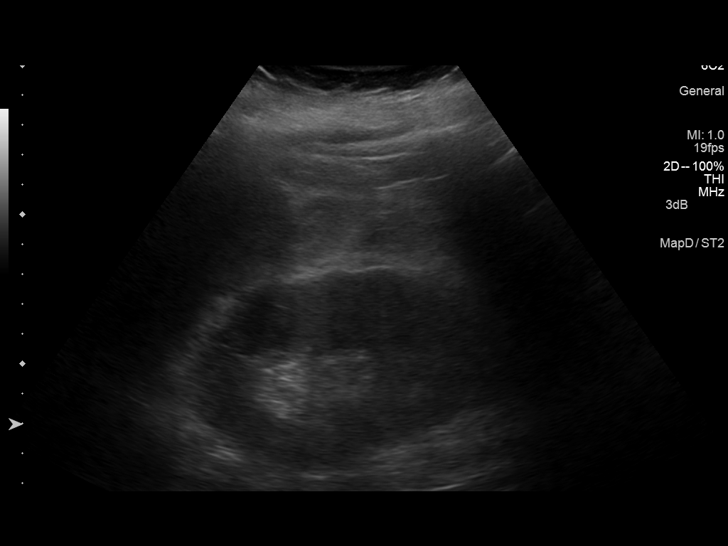
[im 18/33]
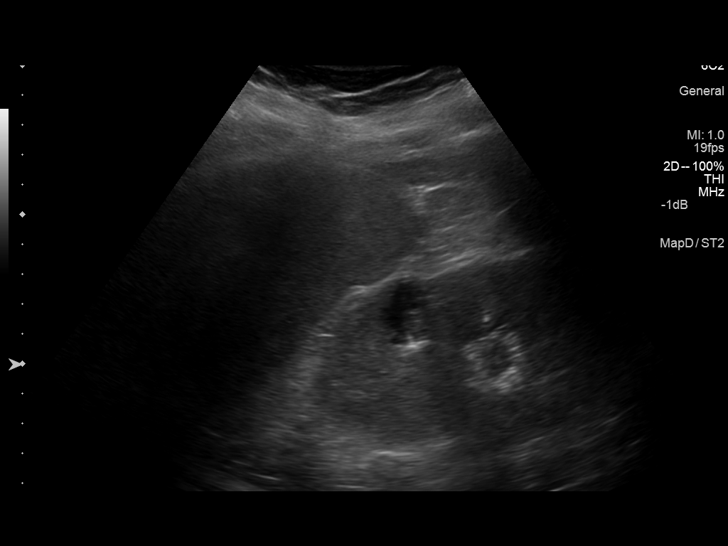
[im 21/33]
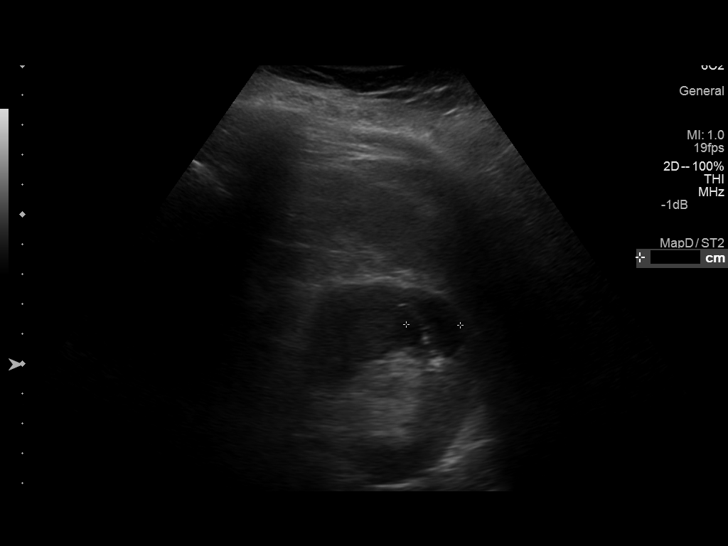
[im 22/33]
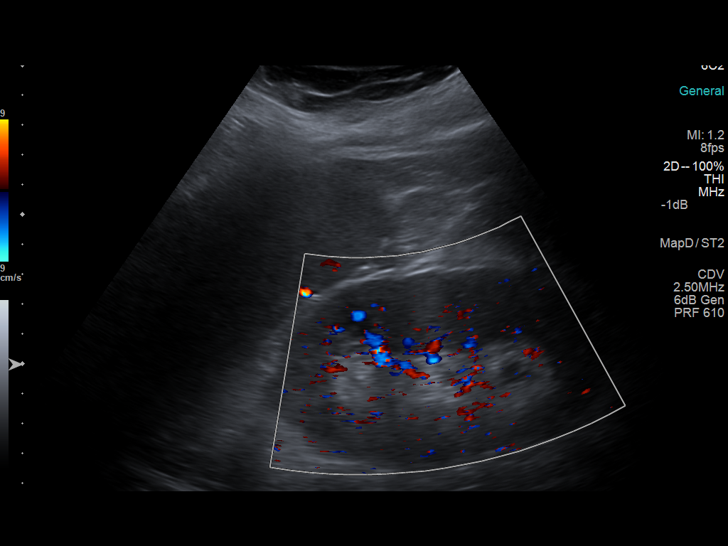
[im 25/33]
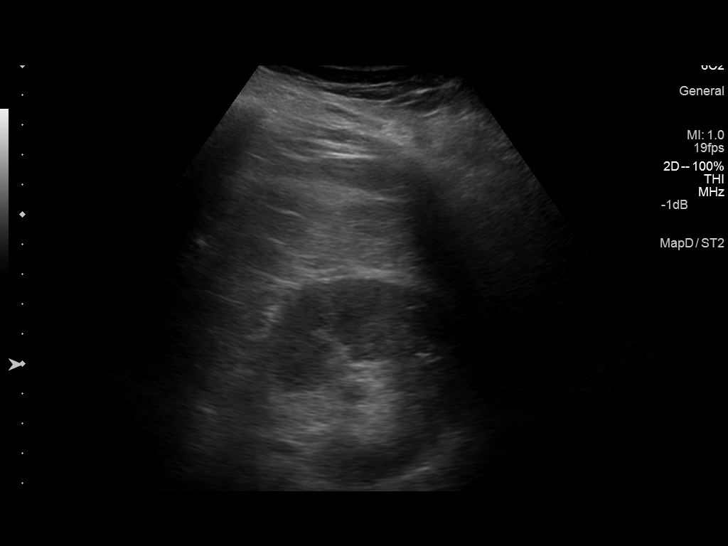
[im 27/33]
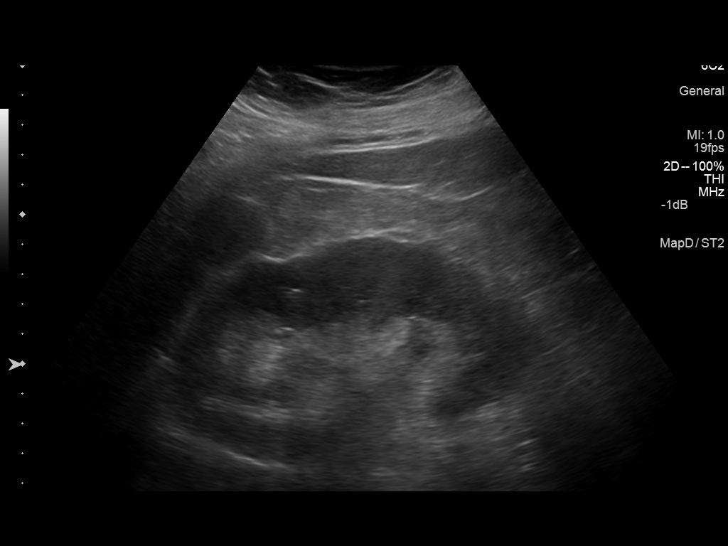
[im 30/33]
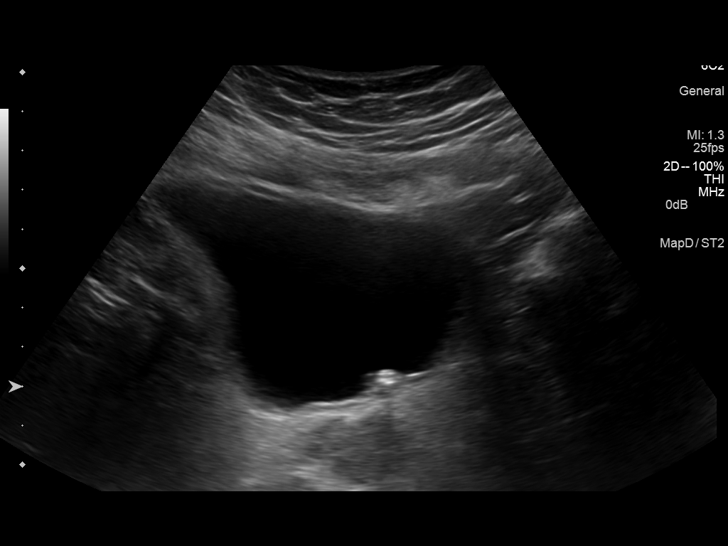
[im 33/33]
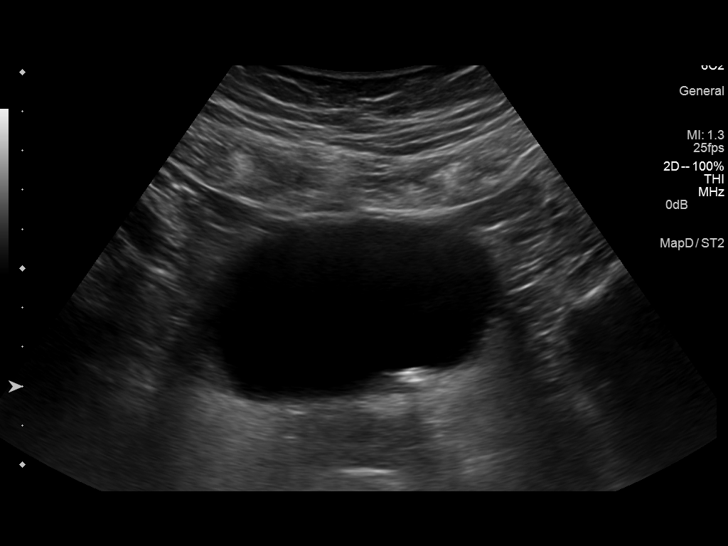

[14 of 25 positions shown; findings below may reference images not displayed]

FINDINGS: Right Kidney:

Length: 11.8 cm.. Echogenicity within normal limits. No mass or
hydronephrosis visualized.

Left Kidney:

Length: 12.1 cm.. Mild hydronephrosis. Cyst within the upper pole
the left kidney is again noted measuring 2.3 x 1.8 x 1.8 cm. On
recent MRI and this was demonstrated to represent a Bosniak category
1 cyst.

Bladder:

Stone is identified within the base of the bladder measuring 5 mm.
IMPRESSION: 1. Mild left hydronephrosis.
2. Bladder calculus.

## 2016-02-29 ENCOUNTER — Ambulatory Visit (INDEPENDENT_AMBULATORY_CARE_PROVIDER_SITE_OTHER): Payer: Medicare Other | Admitting: Podiatry

## 2016-02-29 ENCOUNTER — Ambulatory Visit (INDEPENDENT_AMBULATORY_CARE_PROVIDER_SITE_OTHER): Payer: Medicare Other

## 2016-02-29 ENCOUNTER — Encounter: Payer: Self-pay | Admitting: Podiatry

## 2016-02-29 VITALS — BP 104/72 | HR 81 | Resp 16 | Ht 70.0 in | Wt 232.0 lb

## 2016-02-29 DIAGNOSIS — M7661 Achilles tendinitis, right leg: Secondary | ICD-10-CM

## 2016-02-29 DIAGNOSIS — M21619 Bunion of unspecified foot: Secondary | ICD-10-CM | POA: Diagnosis not present

## 2016-02-29 DIAGNOSIS — M79671 Pain in right foot: Secondary | ICD-10-CM | POA: Diagnosis not present

## 2016-02-29 DIAGNOSIS — L84 Corns and callosities: Secondary | ICD-10-CM

## 2016-02-29 MED ORDER — TRIAMCINOLONE ACETONIDE 10 MG/ML IJ SUSP: 10.0000 mg | Freq: Once | INTRAMUSCULAR | Status: DC

## 2016-02-29 MED ORDER — TRIAMCINOLONE ACETONIDE 10 MG/ML IJ SUSP
10.0000 mg | Freq: Once | INTRAMUSCULAR | Status: AC
  Administered 2016-02-29: 10 mg

## 2016-02-29 NOTE — Patient Instructions (Signed)

## 2016-02-29 NOTE — Progress Notes (Signed)
   Subjective:    Patient ID: Frederick Keller, male    DOB: December 21, 1955, 60 y.o.   MRN: [id number]  HPI Chief Complaint  Patient presents with  . Foot Pain    Right foot; heel & back of heel; pt stated, "Has had pain for the past week"      Review of Systems  Musculoskeletal: Positive for gait problem.  All other systems reviewed and are negative.      Objective:   Physical Exam        Assessment & Plan:

## 2016-02-29 NOTE — Progress Notes (Signed)
Subjective:     Patient ID: Frederick Keller, male   DOB: Apr 03, 1956, 60 y.o.   MRN: [id number]  HPI patient presents stating he's having a lot of pain in the back of his right heel and it's been going on recently and is had a history of this and wanted to catch it early. I treated him a proximally 4-5 years ago   Review of Systems  All other systems reviewed and are negative.      Objective:   Physical Exam  Constitutional: He is oriented to person, place, and time.  Cardiovascular: Intact distal pulses.   Musculoskeletal: Normal range of motion.  Neurological: He is oriented to person, place, and time.  Skin: Skin is warm.  Nursing note and vitals reviewed.  neurovascular status intact muscle strength adequate range of motion was found to be within normal limits with patient noted to have posterior pain in the right heel plantar portion with the Achilles tendon insertion appearing healthy. It is inflamed and sore when palpated and I did not note equinus condition and found patient to have good digital perfusion. Also has structural bunion deformity noted bilateral     Assessment:     Distal Achilles tendinitis right with inflammation with healthy insertion into the calcaneus with bunion deformity bilateral    Plan:     H&P x-rays of right foot reviewed. Today I did a careful injection of the posterior plantar surface 3 mg Kenalog 5 mill grams Xylocaine after explaining risk and chances for rupture. Patient tolerated procedure well will be seen back and will have reduced activity for the next week  X-ray report indicate small posterior spur formation right with small plantar spur formation and structural bunion deformity

## 2016-03-13 ENCOUNTER — Other Ambulatory Visit: Payer: Self-pay | Admitting: Internal Medicine

## 2016-03-15 NOTE — Telephone Encounter (Signed)
Rx(s) sent to pharmacy electronically.  

## 2016-05-09 ENCOUNTER — Ambulatory Visit (INDEPENDENT_AMBULATORY_CARE_PROVIDER_SITE_OTHER): Payer: Medicare Other | Admitting: Internal Medicine

## 2016-05-09 ENCOUNTER — Encounter: Payer: Self-pay | Admitting: Internal Medicine

## 2016-05-09 VITALS — BP 111/75 | HR 73 | Ht 70.0 in | Wt 240.2 lb

## 2016-05-09 DIAGNOSIS — I428 Other cardiomyopathies: Secondary | ICD-10-CM

## 2016-05-09 DIAGNOSIS — E668 Other obesity: Secondary | ICD-10-CM | POA: Diagnosis not present

## 2016-05-09 DIAGNOSIS — I10 Essential (primary) hypertension: Secondary | ICD-10-CM | POA: Diagnosis not present

## 2016-05-09 HISTORY — DX: Essential (primary) hypertension: I10

## 2016-05-09 MED ORDER — LISINOPRIL 2.5 MG PO TABS: 2.5000 mg | ORAL_TABLET | Freq: Every day | ORAL | 3 refills | Status: DC

## 2016-05-09 MED ORDER — METOPROLOL TARTRATE 25 MG PO TABS: 25.0000 mg | ORAL_TABLET | Freq: Two times a day (BID) | ORAL | 3 refills | Status: DC

## 2016-05-09 NOTE — Patient Instructions (Addendum)
Your physician wants you to follow-up in: ONE YEAR with Dr. Hilty. You will receive a reminder letter in the mail two months in advance. If you don't receive a letter, please call our office to schedule the follow-up appointment.  Your physician recommends that you continue on your current medications as directed. Please refer to the Current Medication list given to you today.   

## 2016-05-09 NOTE — Progress Notes (Signed)
OFFICE NOTE  Chief Complaint:  Sinus congestion  Primary Care Physician: Irven Shelling, MD  HPI:  Frederick Keller is a 61 y.o. male who presents for follow up post hospitalization for significant chest pain and EF found by echo to be 25-30%. CARDIAC CATH showed EF 45% and patent coronary arteries. He did have an episode during hospitalization of SVT rate of 170. No further episodes that the pt is aware of.It is possible that he is having episodes of SVT which led to a nonischemic cardiomyopathy. He subsequently was started on metoprolol and has been started on lisinopril as well. Overall he feels well and denies any chest pain or worsening shortness of breath. His weight is been stable and monitored on a daily basis. He is not on a diuretic due to compensated symptoms.  I saw Frederick Keller in back in the office today. He reports feeling well except he does have a dry, nagging cough. He is on lisinopril 2.5 mg twice daily. A repeat echo recently which was a limited study does indicate EF is improved now up to 50-55%. This is reassuring and I've encouraged him to continue to stay on his heart failure medications. In fact at this point I feel there is room to increase the dose however I'm concerned about possible ACE inhibitor side effects.   Frederick Keller returns today for follow-up. He's had no recurrent SVT. He denies any palpitations or chest pain. He has no shortness of breath. He was having cough and I switched him off of lisinopril to an ARB. He thinks that medicine made him cough more although it may be some other reason he was having cough because he stopped it and eventually the cough went away. He is now gone back to lisinopril and he is having no side effects from the medicine. Blood pressure is low normal today but he is asymptomatic with it. Generally feels well. EF is remain stable and is at 50-55%.  05/09/2016  Frederick Keller was seen today in follow-up. He is complaining of some  sinus congestion. He denies any worsening shortness of breath or chest pain. He's done well on his current medications for heart failure. EF had improved up to 50-55%. He is asymptomatic and can exercise without difficulty. Blood pressure is well-controlled. Is stable but could be improved.  PMHx:  Past Medical History:  Diagnosis Date  . Arthritis   . Diabetes mellitus    type 2-diet controlled  . Gout   . High cholesterol   . History of kidney stones   . Nodule of right lung    lower-being monitored    Past Surgical History:  Procedure Laterality Date  . ANKLE SURGERY Right 1970  . JOINT REPLACEMENT     rt knee-2008  . KNEE ARTHROSCOPY Bilateral    x2 right, x1 left  . LEFT HEART CATHETERIZATION WITH CORONARY ANGIOGRAM N/A 06/30/2014   Procedure: LEFT HEART CATHETERIZATION WITH CORONARY ANGIOGRAM;  Surgeon: Troy Sine, MD;  Location: [facility] CATH LAB;  Service: Cardiovascular;  Laterality: N/A;  . LITHOTRIPSY    . NASAL SEPTUM SURGERY  1997  . NASAL SINUS SURGERY  1972   reconstruction with tonsils  . NOSE SURGERY  1960   from horse accident  . TONSILLECTOMY  age 21  . TOTAL KNEE ARTHROPLASTY Left 02/25/2014   Procedure: LEFT TOTAL KNEE ARTHROPLASTY;  Surgeon: Mauri Pole, MD;  Location: [facility] ORS;  Service: Orthopedics;  Laterality: Left;    FAMHx:  Family History  Problem Relation Age of Onset  . CAD Mother   . Heart disease Mother   . Dementia Mother   . CAD Father   . Heart attack Father   . Healthy Sister   . Healthy Brother   . Healthy Sister   . Diabetes Brother   . Kidney cancer Brother   . Healthy Sister     SOCHx:   reports that he quit smoking about 20 years ago. His smoking use included Cigarettes. He quit after 30.00 years of use. He has never used smokeless tobacco. He reports that he does not drink alcohol or use drugs.  ALLERGIES:  Allergies  Allergen Reactions  . Dilaudid [Hydromorphone Hcl] Nausea And Vomiting  . Penicillins Hives     ROS: Pertinent items noted in HPI and remainder of comprehensive ROS otherwise negative.  HOME MEDS: Current Outpatient Prescriptions  Medication Sig Dispense Refill  . allopurinol (ZYLOPRIM) 300 MG tablet Take 300 mg by mouth at bedtime.     Marland Kitchen aspirin 81 MG tablet Take 81 mg by mouth daily.    . cholecalciferol (VITAMIN D) 1000 UNITS tablet Take 1,000 Units by mouth at bedtime.     . cyanocobalamin (,VITAMIN B-12,) 1000 MCG/ML injection Inject 1,000 mcg into the muscle every 30 (thirty) days. 2nd Wednesday of the month.    . lisinopril (PRINIVIL,ZESTRIL) 2.5 MG tablet Take 1 tablet (2.5 mg total) by mouth daily. 90 tablet 3  . loratadine (CLARITIN) 10 MG tablet Take 10 mg by mouth at bedtime.     . metoprolol tartrate (LOPRESSOR) 25 MG tablet TAKE 1 TABLET BY MOUTH TWO  TIMES DAILY 180 tablet 2  . tamsulosin (FLOMAX) 0.4 MG CAPS capsule Take 0.4 mg by mouth daily as needed (kidney stones).      Current Facility-Administered Medications  Medication Dose Route Frequency Provider Last Rate Last Dose  . triamcinolone acetonide (KENALOG) 10 MG/ML injection 10 mg  10 mg Other Once Wallene Huh, DPM        LABS/IMAGING: No results found for this or any previous visit (from the past 48 hour(s)). No results found.  WEIGHTS: Wt Readings from Last 3 Encounters:  05/09/16 240 lb 3.2 oz (109 kg)  02/29/16 232 lb (105.2 kg)  06/28/15 243 lb 8 oz (110.5 kg)    VITALS: BP 111/75 (BP Location: Right Arm)   Pulse 73   Ht 5\' 10"  (1.778 m)   Wt 240 lb 3.2 oz (109 kg)   BMI 34.47 kg/m   EXAM: General appearance: alert and no distress Neck: no carotid bruit and no JVD Lungs: clear to auscultation bilaterally Heart: regular rate and rhythm, S1, S2 normal, no murmur, click, rub or gallop Abdomen: soft, non-tender; bowel sounds normal; no masses,  no organomegaly Extremities: extremities normal, atraumatic, no cyanosis or edema Pulses: 2+ and symmetric Skin: Skin color, texture, turgor  normal. No rashes or lesions Neurologic: Grossly normal Psych: Normal  EKG: Normal sinus rhythm at 73  ASSESSMENT: 1. Nonischemic cardiomyopathy- EF now 50-55% by echo (2016) 2. Well compensated with [place] heart association class I symptoms 3. Dyslipidemia 4. Obesity with poor diet  PLAN: 1.   Frederick Keller reports stable symptoms without any worsening shortness of breath. He is active although not as much during the winter and weight could be improved. Of encouraged continued work on weight loss and more exercise. Cholesterol followed by his primary care provider. He does have some sinus congestion today and plan to  see his primary this afternoon for this. No changes to medicines today. Follow-up annually.  Pixie Casino, MD, [facility] Attending Cardiologist [place] 05/09/2016, 10:22 AM

## 2016-08-07 ENCOUNTER — Encounter (HOSPITAL_COMMUNITY): Payer: Self-pay | Admitting: Emergency Medicine

## 2016-08-07 ENCOUNTER — Emergency Department (HOSPITAL_COMMUNITY): Payer: Medicare Other

## 2016-08-07 ENCOUNTER — Emergency Department (HOSPITAL_COMMUNITY)
Admission: EM | Admit: 2016-08-07 | Discharge: 2016-08-07 | Disposition: A | Discharge status: Home / Self Care | Payer: Medicare Other | Attending: Emergency Medicine | Admitting: Emergency Medicine

## 2016-08-07 DIAGNOSIS — E119 Type 2 diabetes mellitus without complications: Secondary | ICD-10-CM | POA: Insufficient documentation

## 2016-08-07 DIAGNOSIS — H578 Other specified disorders of eye and adnexa: Secondary | ICD-10-CM | POA: Diagnosis present

## 2016-08-07 DIAGNOSIS — R05 Cough: Secondary | ICD-10-CM

## 2016-08-07 DIAGNOSIS — Z79899 Other long term (current) drug therapy: Secondary | ICD-10-CM | POA: Insufficient documentation

## 2016-08-07 DIAGNOSIS — H1031 Unspecified acute conjunctivitis, right eye: Secondary | ICD-10-CM

## 2016-08-07 DIAGNOSIS — Z7982 Long term (current) use of aspirin: Secondary | ICD-10-CM | POA: Diagnosis not present

## 2016-08-07 DIAGNOSIS — R059 Cough, unspecified: Secondary | ICD-10-CM

## 2016-08-07 DIAGNOSIS — I10 Essential (primary) hypertension: Secondary | ICD-10-CM | POA: Diagnosis not present

## 2016-08-07 DIAGNOSIS — Z96652 Presence of left artificial knee joint: Secondary | ICD-10-CM | POA: Diagnosis not present

## 2016-08-07 DIAGNOSIS — Z87891 Personal history of nicotine dependence: Secondary | ICD-10-CM | POA: Diagnosis not present

## 2016-08-07 MED ORDER — BENZONATATE 100 MG PO CAPS: 100.0000 mg | ORAL_CAPSULE | Freq: Three times a day (TID) | ORAL | 0 refills | Status: DC

## 2016-08-07 MED ORDER — BACITRACIN-POLYMYXIN B 500-10000 UNIT/GM OP OINT: 1.0000 "application " | TOPICAL_OINTMENT | Freq: Two times a day (BID) | OPHTHALMIC | 0 refills | Status: DC

## 2016-08-07 NOTE — ED Notes (Signed)
Seen at walk in clinic last Sunday given nasal spray and magi mouth wash , started ery on Tuesday and pred  Friday , got shot and then started the  dose pack, states Hydrodone  Not helping him sleep also given inhaler, now eyes are red and rt one has green drainage

## 2016-08-07 NOTE — ED Provider Notes (Signed)
[facility] DEPT Provider Note   CSN: [id number] Arrival date & time: 08/07/16  0258  By signing my name below, I, Mayer Masker, attest that this documentation has been prepared under the direction and in the presence of Wyn Quaker, [place]. Electronically Signed: Mayer Masker, Scribe. 08/07/16. 12:00 PM.   History   Chief Complaint Chief Complaint  Patient presents with  . URI    The history is provided by the patient. No language interpreter was used.  HPI Comments: Frederick Keller is a 61 y.o. male with a h/o CHF who presents to the Emergency Department complaining of continuous, dry cough that worsens when he lies down since 8 days. He has associated red eyes, itchy eyes with no foreign body sensation, discharge on his right eye, congestion, postnasal drip, sore throat, difficulty sleeping, and difficulty breathing when lying down and coughing. He states he has been seen for this three times in the last week. He was prescribed erythromycin 4x/day for 7 days by his PCP, but was told to stop taking it as it was viral in etiology. Over the past week, he has tried saline nasal spray, nasonex, magic mouthwash, hydrocodone, prednisone, albuterol inhaler, and cough drops, but with little to no relief. Pt states his sore throat is improving, but his cough has not. He additionally reports that this morning when he woke up his right eye was matted shut, that it has been draining green discharge since this morning. He denies headache, swelling in lower extremities, taking long trips, or lying for prolonged hours. He also denies having seasonal allergies and has been taking Claritin for several years. He denies PMHx of COPD or chronic bronchitis. He takes lisinopril and has been on it for 3 years with no PMHx of lisinopril-associated cough.     Past Medical History:  Diagnosis Date  . Arthritis   . Diabetes mellitus    type 2-diet controlled  . Essential hypertension 05/09/2016  . Gout   .  High cholesterol   . History of kidney stones   . Nodule of right lung    lower-being monitored    Patient Active Problem List   Diagnosis Date Noted  . Essential hypertension 05/09/2016  . Nonischemic cardiomyopathy ([place]) 08/13/2014  . Paroxysmal SVT (supraventricular tachycardia) ([place]) 06/30/2014  . Diabetes mellitus type 2, uncontrolled ([place]) 06/29/2014  . Moderate obesity 02/26/2014  . S/P left TKA 02/25/2014    Past Surgical History:  Procedure Laterality Date  . ANKLE SURGERY Right 1970  . JOINT REPLACEMENT     rt knee-2008  . KNEE ARTHROSCOPY Bilateral    x2 right, x1 left  . LEFT HEART CATHETERIZATION WITH CORONARY ANGIOGRAM N/A 06/30/2014   Procedure: LEFT HEART CATHETERIZATION WITH CORONARY ANGIOGRAM;  Surgeon: Troy Sine, MD;  Location: [facility] CATH LAB;  Service: Cardiovascular;  Laterality: N/A;  . LITHOTRIPSY    . NASAL SEPTUM SURGERY  1997  . NASAL SINUS SURGERY  1972   reconstruction with tonsils  . NOSE SURGERY  1960   from horse accident  . TONSILLECTOMY  age 35  . TOTAL KNEE ARTHROPLASTY Left 02/25/2014   Procedure: LEFT TOTAL KNEE ARTHROPLASTY;  Surgeon: Mauri Pole, MD;  Location: [facility] ORS;  Service: Orthopedics;  Laterality: Left;       Home Medications    Prior to Admission medications   Medication Sig Start Date End Date Taking? Authorizing Provider  allopurinol (ZYLOPRIM) 300 MG tablet Take 300 mg by mouth at bedtime.  [provider]  aspirin 81 MG tablet Take 81 mg by mouth daily.    [provider]  bacitracin-polymyxin b (POLYSPORIN) ophthalmic ointment Place 1 application into the right eye every 12 (twelve) hours. apply to eye every 12 hours while awake 08/07/16   Lorin Glass, PA-C  benzonatate (TESSALON) 100 MG capsule Take 1 capsule (100 mg total) by mouth every 8 (eight) hours. 08/07/16   Lorin Glass, PA-C  cholecalciferol (VITAMIN D) 1000 UNITS tablet Take 1,000 Units by mouth at bedtime.     [provider]  cyanocobalamin (,VITAMIN B-12,) 1000 MCG/ML injection Inject 1,000 mcg into the muscle every 30 (thirty) days. 2nd Wednesday of the month.    [provider]  lisinopril (PRINIVIL,ZESTRIL) 2.5 MG tablet Take 1 tablet (2.5 mg total) by mouth daily. 05/09/16   Pixie Casino, MD  loratadine (CLARITIN) 10 MG tablet Take 10 mg by mouth at bedtime.     [provider]  metoprolol tartrate (LOPRESSOR) 25 MG tablet Take 1 tablet (25 mg total) by mouth 2 (two) times daily. 05/09/16   Pixie Casino, MD  tamsulosin (FLOMAX) 0.4 MG CAPS capsule Take 0.4 mg by mouth daily as needed (kidney stones).     [provider]    Family History Family History  Problem Relation Age of Onset  . CAD Mother   . Heart disease Mother   . Dementia Mother   . CAD Father   . Heart attack Father   . Healthy Sister   . Healthy Brother   . Healthy Sister   . Diabetes Brother   . Kidney cancer Brother   . Healthy Sister     Social History Social History  Substance Use Topics  . Smoking status: Former Smoker    Years: 30.00    Types: Cigarettes    Quit date: 04/04/1996  . Smokeless tobacco: Never Used  . Alcohol use No     Allergies   Dilaudid [hydromorphone hcl] and Penicillins   Review of Systems Review of Systems  HENT: Positive for congestion, postnasal drip and sore throat.   Eyes: Positive for discharge (Right), redness and itching.  Respiratory: Positive for cough and shortness of breath (with coughing).   Allergic/Immunologic: Negative for environmental allergies.  Psychiatric/Behavioral: Positive for sleep disturbance.     Physical Exam Updated Vital Signs BP 100/77   Pulse 78   Temp 98.5 F (36.9 C)   Resp 18   SpO2 97%   Physical Exam  Constitutional: He appears well-developed and well-nourished.  HENT:  Head: Normocephalic and atraumatic.  Eyes: EOM and lids are normal. Pupils are equal, round, and reactive to light. Right conjunctiva  is injected. Left conjunctiva is not injected.  Right eye has obvious green discharge with redness to the medial conjuctiva    Cardiovascular: Normal rate.   Pulmonary/Chest: Effort normal and breath sounds normal. No respiratory distress. He has no wheezes. He has no rales.  Neurological: He is alert.  Skin: Skin is warm and dry.  No pitting edema  Psychiatric: He has a normal mood and affect.  Nursing note and vitals reviewed.    ED Treatments / Results  DIAGNOSTIC STUDIES: Oxygen Saturation is 97% on RA, normal by my interpretation.    COORDINATION OF CARE: 11:46 AM Discussed treatment plan with pt at bedside and pt agreed to plan.  Labs (all labs ordered are listed, but only abnormal results are displayed) Labs Reviewed - No data to  display  EKG  EKG Interpretation None       Radiology Dg Chest 2 View  Result Date: 08/07/2016 CLINICAL DATA:  Productive cough EXAM: CHEST  2 VIEW COMPARISON:  06/29/2014 FINDINGS: The heart size and mediastinal contours are within normal limits. Both lungs are clear. The visualized skeletal structures are unremarkable. IMPRESSION: No active cardiopulmonary disease. Electronically Signed   By: Kerby Moors M.D.   On: 08/07/2016 11:19    Procedures Procedures (including critical care time)  Medications Ordered in ED Medications - No data to display   Initial Impression / Assessment and Plan / ED Course  I have reviewed the triage vital signs and the nursing notes.  Pertinent labs & imaging results that were available during my care of the patient were reviewed by me and considered in my medical decision making (see chart for details).     Cindy Hazy has an eye exam consistent with conjunctivitis.  Given history of waking up with right eye matted shut and thick green discharge I feel that there is a high probability of bacterial conjunctivitis despite the underlying viral URI.  He will be given polysporin ointment for his eye. He  was instructed to follow up with PCP for evaluation.   Pt CXR negative for acute infiltrate. Patients symptoms are consistent with URI, likely viral etiology. Discussed that antibiotics are not indicated for viral infections. Pt will be discharged with symptomatic treatment.  He has already had multiple visits for the same complaint with multiple providers and been given multiple different medications.  It was explained to the patient that a cold takes time to resolve and that there is not a medication that will make it better sooner.  He was given Tessalon pearls for cough as he is mostly complaining of cough and has not tried that yet.  Lisinopril cough was considered but low probability as cough occurred at same time as congestion.  CHF exacerbation was considered but no evidence of volume overload on exam/CXR and would not explain the congestion starting at the same time as the cough.  Patient verbalizes understanding and is agreeable with plan. Pt is hemodynamically stable & in NAD prior to dc.   At this time there does not appear to be any evidence of an acute emergency medical condition and the patient appears stable for discharge with appropriate outpatient follow up.Diagnosis was discussed with patient who verbalizes understanding and is agreeable to discharge. Pt case discussed with Dr. Lita Mains who agrees with my plan.    Final Clinical Impressions(s) / ED Diagnoses   Final diagnoses:  Cough  Acute conjunctivitis of right eye, unspecified acute conjunctivitis type    New Prescriptions Discharge Medication List as of 08/07/2016 12:05 PM    START taking these medications   Details  bacitracin-polymyxin b (POLYSPORIN) ophthalmic ointment Place 1 application into the right eye every 12 (twelve) hours. apply to eye every 12 hours while awake, Starting Sun 08/07/2016, Print    benzonatate (TESSALON) 100 MG capsule Take 1 capsule (100 mg total) by mouth every 8 (eight) hours., Starting Sun  08/07/2016, Print      I personally performed the services described in this documentation, which was scribed in my presence. The recorded information has been reviewed and is accurate.     Lorin Glass, PA-C 08/07/16 1722    Julianne Rice, MD 08/19/16 1525

## 2016-08-07 NOTE — ED Triage Notes (Signed)
Pt reports cough since last Saturday. Has been seen 3 times for this. Negative strep test. Pt has been given antibiotic, prednisone injection and pills, magic mouthwash and inhaler. Pt reports diagnosis of asthmatic bronchitis. Pt reports he cannot sleep and wants a chest xray. Pt reports this morning has new onset green eye drainage.

## 2017-01-23 ENCOUNTER — Ambulatory Visit
Admission: RE | Admit: 2017-01-23 | Discharge: 2017-01-23 | Disposition: A | Discharge status: Home / Self Care | Payer: Medicare Other | Source: Ambulatory Visit | Attending: Nurse Practitioner | Admitting: Nurse Practitioner

## 2017-01-23 ENCOUNTER — Other Ambulatory Visit: Payer: Self-pay | Admitting: Nurse Practitioner

## 2017-01-23 DIAGNOSIS — R059 Cough, unspecified: Secondary | ICD-10-CM

## 2017-01-23 DIAGNOSIS — R05 Cough: Secondary | ICD-10-CM

## 2017-02-25 ENCOUNTER — Other Ambulatory Visit: Payer: Self-pay | Admitting: Internal Medicine

## 2017-02-25 DIAGNOSIS — I10 Essential (primary) hypertension: Secondary | ICD-10-CM

## 2017-02-25 DIAGNOSIS — I428 Other cardiomyopathies: Secondary | ICD-10-CM

## 2017-05-15 ENCOUNTER — Encounter: Payer: Self-pay | Admitting: Internal Medicine

## 2017-05-15 ENCOUNTER — Ambulatory Visit: Payer: Medicare Other | Admitting: Internal Medicine

## 2017-05-15 VITALS — BP 110/74 | HR 67 | Ht 70.0 in | Wt 238.0 lb

## 2017-05-15 DIAGNOSIS — I428 Other cardiomyopathies: Secondary | ICD-10-CM | POA: Diagnosis not present

## 2017-05-15 DIAGNOSIS — I10 Essential (primary) hypertension: Secondary | ICD-10-CM | POA: Diagnosis not present

## 2017-05-15 DIAGNOSIS — E668 Other obesity: Secondary | ICD-10-CM | POA: Diagnosis not present

## 2017-05-15 DIAGNOSIS — E782 Mixed hyperlipidemia: Secondary | ICD-10-CM

## 2017-05-15 NOTE — Patient Instructions (Signed)
Your physician wants you to follow-up in: ONE YEAR with Dr. Hilty. You will receive a reminder letter in the mail two months in advance. If you don't receive a letter, please call our office to schedule the follow-up appointment.  

## 2017-05-17 ENCOUNTER — Encounter: Payer: Self-pay | Admitting: Internal Medicine

## 2017-05-17 DIAGNOSIS — E782 Mixed hyperlipidemia: Secondary | ICD-10-CM | POA: Insufficient documentation

## 2017-05-17 NOTE — Progress Notes (Signed)
OFFICE NOTE  Chief Complaint:  Sinus congestion  Primary Care Physician: Lavone Orn, MD  HPI:  Frederick Keller is a 62 y.o. male who presents for follow up post hospitalization for significant chest pain and EF found by echo to be 25-30%. CARDIAC CATH showed EF 45% and patent coronary arteries. He did have an episode during hospitalization of SVT rate of 170. No further episodes that the pt is aware of.It is possible that he is having episodes of SVT which led to a nonischemic cardiomyopathy. He subsequently was started on metoprolol and has been started on lisinopril as well. Overall he feels well and denies any chest pain or worsening shortness of breath. His weight is been stable and monitored on a daily basis. He is not on a diuretic due to compensated symptoms.  I saw Frederick Keller in back in the office today. He reports feeling well except he does have a dry, nagging cough. He is on lisinopril 2.5 mg twice daily. A repeat echo recently which was a limited study does indicate EF is improved now up to 50-55%. This is reassuring and I've encouraged him to continue to stay on his heart failure medications. In fact at this point I feel there is room to increase the dose however I'm concerned about possible ACE inhibitor side effects.   Frederick Keller returns today for follow-up. He's had no recurrent SVT. He denies any palpitations or chest pain. He has no shortness of breath. He was having cough and I switched him off of lisinopril to an ARB. He thinks that medicine made him cough more although it may be some other reason he was having cough because he stopped it and eventually the cough went away. He is now gone back to lisinopril and he is having no side effects from the medicine. Blood pressure is low normal today but he is asymptomatic with it. Generally feels well. EF is remain stable and is at 50-55%.  05/09/2016  Frederick Keller was seen today in follow-up. He is complaining of some sinus  congestion. He denies any worsening shortness of breath or chest pain. He's done well on his current medications for heart failure. EF had improved up to 50-55%. He is asymptomatic and can exercise without difficulty. Blood pressure is well-controlled. Is stable but could be improved.  05/16/2017  Frederick Keller returns today for annual follow-up.  He denies any chest pain or worsening shortness of breath.  Blood pressure is well-controlled today 110/74.  He reports compliance with his losartan and metoprolol.  LV function has normalized.  EKG shows sinus rhythm at 67.  PMHx:  Past Medical History:  Diagnosis Date  . Arthritis   . Diabetes mellitus    type 2-diet controlled  . Essential hypertension 05/09/2016  . Gout   . High cholesterol   . History of kidney stones   . Nodule of right lung    lower-being monitored    Past Surgical History:  Procedure Laterality Date  . ANKLE SURGERY Right 1970  . JOINT REPLACEMENT     rt knee-2008  . KNEE ARTHROSCOPY Bilateral    x2 right, x1 left  . LEFT HEART CATHETERIZATION WITH CORONARY ANGIOGRAM N/A 06/30/2014   Procedure: LEFT HEART CATHETERIZATION WITH CORONARY ANGIOGRAM;  Surgeon: Troy Sine, MD;  Location: [facility] CATH LAB;  Service: Cardiovascular;  Laterality: N/A;  . LITHOTRIPSY    . NASAL SEPTUM SURGERY  1997  . NASAL SINUS SURGERY  1972   reconstruction  with tonsils  . NOSE SURGERY  1960   from horse accident  . TONSILLECTOMY  age 89  . TOTAL KNEE ARTHROPLASTY Left 02/25/2014   Procedure: LEFT TOTAL KNEE ARTHROPLASTY;  Surgeon: Mauri Pole, MD;  Location: [facility];  Service: Orthopedics;  Laterality: Left;    FAMHx:  Family History  Problem Relation Age of Onset  . CAD Mother   . Heart disease Mother   . Dementia Mother   . CAD Father   . Heart attack Father   . Healthy Sister   . Healthy Brother   . Healthy Sister   . Diabetes Brother   . Kidney cancer Brother   . Healthy Sister     SOCHx:   reports that he quit  smoking about 21 years ago. His smoking use included cigarettes. He quit after 30.00 years of use. he has never used smokeless tobacco. He reports that he does not drink alcohol or use drugs.  ALLERGIES:  Allergies  Allergen Reactions  . Dilaudid [Hydromorphone Hcl] Nausea And Vomiting  . Penicillins Hives    ROS: Pertinent items noted in HPI and remainder of comprehensive ROS otherwise negative.  HOME MEDS: Current Outpatient Medications  Medication Sig Dispense Refill  . allopurinol (ZYLOPRIM) 300 MG tablet Take 300 mg by mouth at bedtime.     Marland Kitchen aspirin 81 MG tablet Take 81 mg by mouth daily.    . cholecalciferol (VITAMIN D) 1000 UNITS tablet Take 1,000 Units by mouth at bedtime.     . cyanocobalamin (,VITAMIN B-12,) 1000 MCG/ML injection Inject 1,000 mcg into the muscle every 30 (thirty) days. 2nd Wednesday of the month.    . fexofenadine (ALLEGRA) 180 MG tablet Take 180 mg by mouth daily.    Marland Kitchen losartan (COZAAR) 25 MG tablet Take 12.5 mg by mouth daily. 1/2 TABLET (12.5MG ) DAILY    . metoprolol tartrate (LOPRESSOR) 25 MG tablet TAKE 1 TABLET BY MOUTH TWO  TIMES DAILY 180 tablet 3  . pravastatin (PRAVACHOL) 40 MG tablet Take 40 mg by mouth daily.    . tamsulosin (FLOMAX) 0.4 MG CAPS capsule Take 0.4 mg by mouth daily as needed (kidney stones).      Current Facility-Administered Medications  Medication Dose Route Frequency Provider Last Rate Last Dose  . triamcinolone acetonide (KENALOG) 10 MG/ML injection 10 mg  10 mg Other Once Wallene Huh, DPM        LABS/IMAGING: No results found for this or any previous visit (from the past 48 hour(s)). No results found.  WEIGHTS: Wt Readings from Last 3 Encounters:  05/15/17 238 lb (108 kg)  05/09/16 240 lb 3.2 oz (109 kg)  02/29/16 232 lb (105.2 kg)    VITALS: BP 110/74   Pulse 67   Ht 5\' 10"  (1.778 m)   Wt 238 lb (108 kg)   BMI 34.15 kg/m   EXAM: General appearance: alert and no distress Neck: no carotid bruit and no  JVD Lungs: clear to auscultation bilaterally Heart: regular rate and rhythm, S1, S2 normal, no murmur, click, rub or gallop Abdomen: soft, non-tender; bowel sounds normal; no masses,  no organomegaly Extremities: extremities normal, atraumatic, no cyanosis or edema Pulses: 2+ and symmetric Skin: Skin color, texture, turgor normal. No rashes or lesions Neurologic: Grossly normal Psych: Normal  EKG: Sinus rhythm at 67-personally reviewed  ASSESSMENT: 1. Nonischemic cardiomyopathy- EF now 50-55% by echo (2016) 2. Well compensated with [place] heart association class I symptoms 3. Dyslipidemia 4. Obesity with poor  diet  PLAN: 1.   Mr. Coopman continues to do well with normal blood pressure and normalized LVEF on echo.  He is on pravastatin for dyslipidemia we will need to reassess his cholesterol.  Otherwise no changes to his medicines today.  Follow-up with me annually or sooner as necessary.Pixie Casino, MD, [facility], [place] Director of the Advanced Lipid Disorders &  Cardiovascular Risk Reduction Clinic Diplomate of the American Board of Clinical Lipidology Attending Cardiologist  Direct Dial: [phone]  Fax: [phone]  Website:  [email or web address] 05/17/2017, 11:26 AM

## 2017-12-11 ENCOUNTER — Ambulatory Visit
Admission: RE | Admit: 2017-12-11 | Discharge: 2017-12-11 | Disposition: A | Discharge status: Home / Self Care | Payer: Medicare Other | Source: Ambulatory Visit | Attending: Nurse Practitioner | Admitting: Nurse Practitioner

## 2017-12-11 ENCOUNTER — Other Ambulatory Visit: Payer: Self-pay | Admitting: Nurse Practitioner

## 2017-12-11 DIAGNOSIS — J40 Bronchitis, not specified as acute or chronic: Secondary | ICD-10-CM

## 2018-01-22 ENCOUNTER — Other Ambulatory Visit: Payer: Self-pay | Admitting: Internal Medicine

## 2018-01-22 DIAGNOSIS — I10 Essential (primary) hypertension: Secondary | ICD-10-CM

## 2018-01-22 DIAGNOSIS — I428 Other cardiomyopathies: Secondary | ICD-10-CM

## 2018-04-15 ENCOUNTER — Other Ambulatory Visit: Payer: Self-pay | Admitting: Internal Medicine

## 2018-04-15 DIAGNOSIS — I428 Other cardiomyopathies: Secondary | ICD-10-CM

## 2018-04-15 DIAGNOSIS — I10 Essential (primary) hypertension: Secondary | ICD-10-CM

## 2018-05-18 ENCOUNTER — Other Ambulatory Visit: Payer: Self-pay

## 2018-05-24 ENCOUNTER — Encounter (INDEPENDENT_AMBULATORY_CARE_PROVIDER_SITE_OTHER): Payer: Self-pay

## 2018-05-24 ENCOUNTER — Encounter: Payer: Self-pay | Admitting: Internal Medicine

## 2018-05-24 ENCOUNTER — Ambulatory Visit: Payer: Medicare Other | Admitting: Internal Medicine

## 2018-05-24 VITALS — BP 110/70 | HR 73 | Ht 70.0 in | Wt 241.0 lb

## 2018-05-24 DIAGNOSIS — I10 Essential (primary) hypertension: Secondary | ICD-10-CM

## 2018-05-24 DIAGNOSIS — I428 Other cardiomyopathies: Secondary | ICD-10-CM

## 2018-05-24 DIAGNOSIS — E668 Other obesity: Secondary | ICD-10-CM

## 2018-05-24 DIAGNOSIS — E782 Mixed hyperlipidemia: Secondary | ICD-10-CM

## 2018-05-24 NOTE — Progress Notes (Signed)
OFFICE NOTE  Chief Complaint:  No complaints  Primary Care Physician: Lavone Orn, MD  HPI:  Frederick Keller is a 63 y.o. male who presents for follow up post hospitalization for significant chest pain and EF found by echo to be 25-30%. CARDIAC CATH showed EF 45% and patent coronary arteries. He did have an episode during hospitalization of SVT rate of 170. No further episodes that the pt is aware of.It is possible that he is having episodes of SVT which led to a nonischemic cardiomyopathy. He subsequently was started on metoprolol and has been started on lisinopril as well. Overall he feels well and denies any chest pain or worsening shortness of breath. His weight is been stable and monitored on a daily basis. He is not on a diuretic due to compensated symptoms.  I saw [name] in back in the office today. He reports feeling well except he does have a dry, nagging cough. He is on lisinopril 2.5 mg twice daily. A repeat echo recently which was a limited study does indicate EF is improved now up to 50-55%. This is reassuring and I've encouraged him to continue to stay on his heart failure medications. In fact at this point I feel there is room to increase the dose however I'm concerned about possible ACE inhibitor side effects.   [name] returns today for follow-up. He's had no recurrent SVT. He denies any palpitations or chest pain. He has no shortness of breath. He was having cough and I switched him off of lisinopril to an ARB. He thinks that medicine made him cough more although it may be some other reason he was having cough because he stopped it and eventually the cough went away. He is now gone back to lisinopril and he is having no side effects from the medicine. Blood pressure is low normal today but he is asymptomatic with it. Generally feels well. EF is remain stable and is at 50-55%.  05/09/2016  [name] was seen today in follow-up. He is complaining of some sinus  congestion. He denies any worsening shortness of breath or chest pain. He's done well on his current medications for heart failure. EF had improved up to 50-55%. He is asymptomatic and can exercise without difficulty. Blood pressure is well-controlled. Is stable but could be improved.  05/16/2017  [name] returns today for annual follow-up.  He denies any chest pain or worsening shortness of breath.  Blood pressure is well-controlled today 110/74.  He reports compliance with his losartan and metoprolol.  LV function has normalized.  EKG shows sinus rhythm at 67.  05/24/2018  [name] is here today for routine follow-up.  Overall he is done well without any cardiac complaints.  Over the past year he had experienced some cough and was attributed possibly to lisinopril.  He was taken off of that and switch to losartan and his cough did go away.  Recent labs from September 2019 showed total cholesterol 161, HDL 53, LDL 81 and triglycerides under 35.  Hemoglobin A1c of 6.6.  PMHx:  Past Medical History:  Diagnosis Date  . Arthritis   . Diabetes mellitus    type 2-diet controlled  . Essential hypertension 05/09/2016  . Gout   . High cholesterol   . History of kidney stones   . Nodule of right lung    lower-being monitored    Past Surgical History:  Procedure Laterality Date  . ANKLE SURGERY Right 1970  . JOINT  REPLACEMENT     rt knee-2008  . KNEE ARTHROSCOPY Bilateral    x2 right, x1 left  . LEFT HEART CATHETERIZATION WITH CORONARY ANGIOGRAM N/A 06/30/2014   Procedure: LEFT HEART CATHETERIZATION WITH CORONARY ANGIOGRAM;  Surgeon: Troy Sine, MD;  Location: [facility] CATH LAB;  Service: Cardiovascular;  Laterality: N/A;  . LITHOTRIPSY    . NASAL SEPTUM SURGERY  1997  . NASAL SINUS SURGERY  1972   reconstruction with tonsils  . NOSE SURGERY  1960   from horse accident  . TONSILLECTOMY  age 79  . TOTAL KNEE ARTHROPLASTY Left 02/25/2014   Procedure: LEFT TOTAL KNEE ARTHROPLASTY;  Surgeon:  Mauri Pole, MD;  Location: [facility];  Service: Orthopedics;  Laterality: Left;    FAMHx:  Family History  Problem Relation Age of Onset  . CAD Mother   . Heart disease Mother   . Dementia Mother   . CAD Father   . Heart attack Father   . Healthy Sister   . Healthy Brother   . Healthy Sister   . Diabetes Brother   . Kidney cancer Brother   . Healthy Sister     SOCHx:   reports that he quit smoking about 22 years ago. His smoking use included cigarettes. He quit after 30.00 years of use. He has never used smokeless tobacco. He reports that he does not drink alcohol or use drugs.  ALLERGIES:  Allergies  Allergen Reactions  . Dilaudid [Hydromorphone Hcl] Nausea And Vomiting  . Hydromorphone Nausea Only  . Penicillins Hives    ROS: Pertinent items noted in HPI and remainder of comprehensive ROS otherwise negative.  HOME MEDS: Current Outpatient Medications  Medication Sig Dispense Refill  . allopurinol (ZYLOPRIM) 300 MG tablet Take 300 mg by mouth at bedtime.     Marland Kitchen aspirin 81 MG tablet Take 81 mg by mouth daily.    . cholecalciferol (VITAMIN D) 1000 UNITS tablet Take 1,000 Units by mouth at bedtime.     . cyanocobalamin (,VITAMIN B-12,) 1000 MCG/ML injection Inject 1,000 mcg into the muscle every 30 (thirty) days. 2nd Wednesday of the month.    . fexofenadine (ALLEGRA) 180 MG tablet Take 180 mg by mouth daily.    Marland Kitchen losartan (COZAAR) 25 MG tablet Take 12.5 mg by mouth daily. 1/2 TABLET (12.5MG ) DAILY    . metoprolol tartrate (LOPRESSOR) 25 MG tablet TAKE 1 TABLET BY MOUTH TWO  TIMES DAILY 180 tablet 1  . pravastatin (PRAVACHOL) 40 MG tablet Take 40 mg by mouth daily.    . tamsulosin (FLOMAX) 0.4 MG CAPS capsule Take 0.4 mg by mouth daily as needed (kidney stones).      Current Facility-Administered Medications  Medication Dose Route Frequency Provider Last Rate Last Dose  . triamcinolone acetonide (KENALOG) 10 MG/ML injection 10 mg  10 mg Other Once Wallene Huh, DPM         LABS/IMAGING: No results found for this or any previous visit (from the past 48 hour(s)). No results found.  WEIGHTS: Wt Readings from Last 3 Encounters:  05/24/18 241 lb (109.3 kg)  05/15/17 238 lb (108 kg)  05/09/16 240 lb 3.2 oz (109 kg)    VITALS: BP 110/70   Pulse 73   Ht 5\' 10"  (1.778 m)   Wt 241 lb (109.3 kg)   BMI 34.58 kg/m   EXAM: General appearance: alert and no distress Neck: no carotid bruit and no JVD Lungs: clear to auscultation bilaterally Heart: regular rate and rhythm,  S1, S2 normal, no murmur, click, rub or gallop Abdomen: soft, non-tender; bowel sounds normal; no masses,  no organomegaly Extremities: extremities normal, atraumatic, no cyanosis or edema Pulses: 2+ and symmetric Skin: Skin color, texture, turgor normal. No rashes or lesions Neurologic: Grossly normal Psych: Normal  EKG: Normal sinus rhythm 73, nonspecific T wave changes-personally reviewed  ASSESSMENT: 1. Nonischemic cardiomyopathy- EF now 50-55% by echo (2016) 2. Well compensated with [place] heart association class I symptoms 3. Dyslipidemia 4. Obesity with poor diet  PLAN: 1.   Frederick Keller continues to do well is asymptomatic with improved LVEF in 2016.  He does not have anything worse than NYHA class I heart failure symptoms.  His weight still is an issue and recently has gained more weight.  He needs to be more active and he says generally that happens during the summer.  LDL currently is 84, which is likely at goal given his lack of known coronary artery disease.  No changes were made to his medications today.  I encouraged more weight loss.  Plan follow-up with me annually or sooner as necessary.  Pixie Casino, MD, [facility], [place] Director of the Advanced Lipid Disorders &  Cardiovascular Risk Reduction Clinic Diplomate of the American Board of Clinical Lipidology Attending Cardiologist  Direct Dial: [phone]  Fax: [phone]   Website:  [email or web address] 05/24/2018, 9:29 AM

## 2018-05-24 NOTE — Patient Instructions (Signed)
Medication Instructions:  NO CHANGE If you need a refill on your cardiac medications before your next appointment, please call your pharmacy.   Lab work: If you have labs (blood work) drawn today and your tests are completely normal, you will receive your results only by: Marland Kitchen MyChart Message (if you have MyChart) OR . A paper copy in the mail If you have any lab test that is abnormal or we need to change your treatment, we will call you to review the results.  Follow-Up: At [facility], you and your health needs are our priority.  As part of our continuing mission to provide you with exceptional heart care, we have created designated Provider Care Teams.  These Care Teams include your primary Cardiologist (physician) and Advanced Practice Providers (APPs -  Physician Assistants and Nurse Practitioners) who all work together to provide you with the care you need, when you need it. You will need a follow up appointment in 12 months.  Please call our office 2 months in advance to schedule this appointment.  You may see DR HILTY or one of the following Advanced Practice Providers on your designated Care Team: Almyra Deforest, [place] . Fabian Sharp, PA-C

## 2018-11-04 ENCOUNTER — Other Ambulatory Visit: Payer: Self-pay | Admitting: Internal Medicine

## 2018-11-04 DIAGNOSIS — I10 Essential (primary) hypertension: Secondary | ICD-10-CM

## 2018-11-04 DIAGNOSIS — I428 Other cardiomyopathies: Secondary | ICD-10-CM

## 2019-07-03 DIAGNOSIS — M5134 Other intervertebral disc degeneration, thoracic region: Secondary | ICD-10-CM | POA: Insufficient documentation

## 2019-07-03 DIAGNOSIS — M5137 Other intervertebral disc degeneration, lumbosacral region: Secondary | ICD-10-CM | POA: Insufficient documentation

## 2019-07-03 DIAGNOSIS — M51379 Other intervertebral disc degeneration, lumbosacral region without mention of lumbar back pain or lower extremity pain: Secondary | ICD-10-CM | POA: Insufficient documentation

## 2019-07-03 DIAGNOSIS — M546 Pain in thoracic spine: Secondary | ICD-10-CM | POA: Insufficient documentation

## 2019-07-12 ENCOUNTER — Telehealth: Payer: Self-pay | Admitting: Internal Medicine

## 2019-07-12 NOTE — Telephone Encounter (Signed)
Patient said call back to schedule 1 yr follow up with Hilty( he was busy at the moment)

## 2019-08-01 ENCOUNTER — Telehealth (INDEPENDENT_AMBULATORY_CARE_PROVIDER_SITE_OTHER): Payer: Medicare Other | Admitting: Internal Medicine

## 2019-08-01 ENCOUNTER — Encounter: Payer: Self-pay | Admitting: Internal Medicine

## 2019-08-01 VITALS — BP 110/68 | Ht 70.0 in | Wt 232.0 lb

## 2019-08-01 DIAGNOSIS — E119 Type 2 diabetes mellitus without complications: Secondary | ICD-10-CM | POA: Diagnosis not present

## 2019-08-01 DIAGNOSIS — I428 Other cardiomyopathies: Secondary | ICD-10-CM

## 2019-08-01 DIAGNOSIS — E668 Other obesity: Secondary | ICD-10-CM

## 2019-08-01 DIAGNOSIS — I10 Essential (primary) hypertension: Secondary | ICD-10-CM

## 2019-08-01 DIAGNOSIS — E785 Hyperlipidemia, unspecified: Secondary | ICD-10-CM | POA: Diagnosis not present

## 2019-08-01 DIAGNOSIS — E782 Mixed hyperlipidemia: Secondary | ICD-10-CM

## 2019-08-01 NOTE — Addendum Note (Signed)
Addended by: Fidel Levy on: 08/01/2019 08:53 AM   Modules accepted: Orders

## 2019-08-01 NOTE — Progress Notes (Signed)
Virtual Visit via Video Note   This visit type was conducted due to national recommendations for restrictions regarding the COVID-19 Pandemic (e.g. social distancing) in an effort to limit this patient's exposure and mitigate transmission in our community.  Due to his co-morbid illnesses, this patient is at least at moderate risk for complications without adequate follow up.  This format is felt to be most appropriate for this patient at this time.  All issues noted in this document were discussed and addressed.  A limited physical exam was performed with this format.  Please refer to the patient's chart for his consent to telehealth for [facility].   Evaluation Performed:  Caregility video visit  Date:  08/01/2019   ID:  Frederick Keller, DOB Dec 01, 1955, MRN [id number]  Patient Location:  [place] [address]  Provider location:   [address] [place], [place] [address]  PCP:  Lavone Orn, MD  Cardiologist:  No primary care provider on file. Electrophysiologist:  None   Chief Complaint: Follow-up  History of Present Illness:    Frederick Keller is a 64 y.o. male who presents via audio/video conferencing for a telehealth visit today.  Frederick Keller is a 64 y.o. male who presents for follow up post hospitalization for significant chest pain and EF found by echo to be 25-30%. CARDIAC CATH showed EF 45% and patent coronary arteries. He did have an episode during hospitalization of SVT rate of 170. No further episodes that the pt is aware of.It is possible that he is having episodes of SVT which led to a nonischemic cardiomyopathy. He subsequently was started on metoprolol and has been started on lisinopril as well. Overall he feels well and denies any chest pain or worsening shortness of breath. His weight is been stable and monitored on a daily basis. He is not on a diuretic due to compensated symptoms.  I saw Frederick Keller in back in  the office today. He reports feeling well except he does have a dry, nagging cough. He is on lisinopril 2.5 mg twice daily. A repeat echo recently which was a limited study does indicate EF is improved now up to 50-55%. This is reassuring and I've encouraged him to continue to stay on his heart failure medications. In fact at this point I feel there is room to increase the dose however I'm concerned about possible ACE inhibitor side effects.   Frederick Keller returns today for follow-up. He's had no recurrent SVT. He denies any palpitations or chest pain. He has no shortness of breath. He was having cough and I switched him off of lisinopril to an ARB. He thinks that medicine made him cough more although it may be some other reason he was having cough because he stopped it and eventually the cough went away. He is now gone back to lisinopril and he is having no side effects from the medicine. Blood pressure is low normal today but he is asymptomatic with it. Generally feels well. EF is remain stable and is at 50-55%.  05/09/2016  Frederick Keller was seen today in follow-up. He is complaining of some sinus congestion. He denies any worsening shortness of breath or chest pain. He's done well on his current medications for heart failure. EF had improved up to 50-55%. He is asymptomatic and can exercise without difficulty. Blood pressure is well-controlled. Is stable but could be improved.  05/16/2017  Frederick Keller returns today for annual follow-up.  He denies any chest pain or worsening shortness of breath.  Blood pressure is well-controlled today 110/74.  He reports compliance with his losartan and metoprolol.  LV function has normalized.  EKG shows sinus rhythm at 67.  05/24/2018  Frederick Keller is here today for routine follow-up.  Overall he is done well without any cardiac complaints.  Over the past year he had experienced some cough and was attributed possibly to lisinopril.  He was taken off of that and switch  to losartan and his cough did go away.  Recent labs from September 2019 showed total cholesterol 161, HDL 53, LDL 81 and triglycerides under 35.  Hemoglobin A1c of 6.6.  08/01/2019  Frederick Keller returns today for video follow-up.  Overall he says he is feeling well.  Nuys any chest pain or worsening shortness of breath.  Blood pressure is well controlled.  Recent hemoglobin A1c was 6.4 however it was 6.0 in October.  At that time, lipids were also not well controlled with LDL 152.  He is on pravastatin but has previously been intolerant to simvastatin, atorvastatin, and rosuvastatin.  His target LDL is less than 70 given his diabetes, however he not noted to have any epicardial coronary artery disease by cardiac catheterization in 2016.  The patient does not have symptoms concerning for COVID-19 infection (fever, chills, cough, or new SHORTNESS OF BREATH).    Prior CV studies:   The following studies were reviewed today:  Chart reviewed  PMHx:  Past Medical History:  Diagnosis Date  . Arthritis   . Diabetes mellitus    type 2-diet controlled  . Essential hypertension 05/09/2016  . Gout   . High cholesterol   . History of kidney stones   . Nodule of right lung    lower-being monitored    Past Surgical History:  Procedure Laterality Date  . ANKLE SURGERY Right 1970  . JOINT REPLACEMENT     rt knee-2008  . KNEE ARTHROSCOPY Bilateral    x2 right, x1 left  . LEFT HEART CATHETERIZATION WITH CORONARY ANGIOGRAM N/A 06/30/2014   Procedure: LEFT HEART CATHETERIZATION WITH CORONARY ANGIOGRAM;  Surgeon: Troy Sine, MD;  Location: [facility] CATH LAB;  Service: Cardiovascular;  Laterality: N/A;  . LITHOTRIPSY    . NASAL SEPTUM SURGERY  1997  . NASAL SINUS SURGERY  1972   reconstruction with tonsils  . NOSE SURGERY  1960   from horse accident  . TONSILLECTOMY  age 67  . TOTAL KNEE ARTHROPLASTY Left 02/25/2014   Procedure: LEFT TOTAL KNEE ARTHROPLASTY;  Surgeon: Mauri Pole, MD;  Location: [facility]  ORS;  Service: Orthopedics;  Laterality: Left;    FAMHx:  Family History  Problem Relation Age of Onset  . CAD Mother   . Heart disease Mother   . Dementia Mother   . CAD Father   . Heart attack Father   . Healthy Sister   . Healthy Brother   . Healthy Sister   . Diabetes Brother   . Kidney cancer Brother   . Healthy Sister     SOCHx:   reports that he quit smoking about 23 years ago. His smoking use included cigarettes. He quit after 30.00 years of use. He has never used smokeless tobacco. He reports that he does not drink alcohol or use drugs.  ALLERGIES:  Allergies  Allergen Reactions  . Dilaudid [Hydromorphone Hcl] Nausea And Vomiting  . Hydromorphone Nausea Only  . Penicillins Hives  . Lisinopril Cough  MEDS:  Current Meds  Medication Sig  . allopurinol (ZYLOPRIM) 300 MG tablet Take 300 mg by mouth at bedtime.   Marland Kitchen aspirin 81 MG tablet Take 81 mg by mouth daily.  . cholecalciferol (VITAMIN D) 1000 UNITS tablet Take 1,000 Units by mouth at bedtime.   . cyanocobalamin (,VITAMIN B-12,) 1000 MCG/ML injection Inject 1,000 mcg into the muscle every 30 (thirty) days. 2nd Wednesday of the month.  . cyclobenzaprine (FLEXERIL) 5 MG tablet Take 5 mg by mouth 3 (three) times daily as needed.  . fexofenadine (ALLEGRA) 180 MG tablet Take 180 mg by mouth daily.  Marland Kitchen HYDROcodone-acetaminophen (NORCO/VICODIN) 5-325 MG tablet Take 1 tablet by mouth 2 (two) times daily as needed.  Marland Kitchen losartan (COZAAR) 25 MG tablet Take 12.5 mg by mouth daily. 1/2 TABLET (12.5MG ) DAILY  . metoprolol tartrate (LOPRESSOR) 25 MG tablet TAKE 1 TABLET BY MOUTH TWO  TIMES DAILY  . pravastatin (PRAVACHOL) 40 MG tablet Take 40 mg by mouth daily.  . tamsulosin (FLOMAX) 0.4 MG CAPS capsule Take 0.4 mg by mouth daily as needed (kidney stones).    Current Facility-Administered Medications for the 08/01/19 encounter (Video Visit) with Pixie Casino, MD  Medication  . triamcinolone acetonide (KENALOG) 10 MG/ML  injection 10 mg     ROS: Pertinent items noted in HPI and remainder of comprehensive ROS otherwise negative.  Labs/Other Tests and Data Reviewed:    Recent Labs: No results found for requested labs within last 8760 hours.   Recent Lipid Panel Lab Results  Component Value Date/Time   CHOL 148 06/29/2014 06:49 AM   TRIG 136 06/29/2014 06:49 AM   HDL 33 (L) 06/29/2014 06:49 AM   CHOLHDL 4.5 06/29/2014 06:49 AM   LDLCALC 88 06/29/2014 06:49 AM    Wt Readings from Last 3 Encounters:  08/01/19 232 lb (105.2 kg)  05/24/18 241 lb (109.3 kg)  05/15/17 238 lb (108 kg)     Exam:    Vital Signs:  BP 110/68   Ht 5\' 10"  (1.778 m)   Wt 232 lb (105.2 kg)   BMI 33.29 kg/m    General appearance: alert and no distress Lungs: No audible respiratory difficulty Abdomen: Mildly obese Extremities: extremities normal, atraumatic, no cyanosis or edema Neurologic: Mental status: Alert, oriented, thought content appropriate  ASSESSMENT & PLAN:    1. Nonischemic cardiomyopathy- EF now 50-55% by echo (2016) -normal coronaries by cath in 2016 2. Dyslipidemia, goal LDL less than 70 3. Obesity with poor diet 4. Type 2 diabetes-hemoglobin A1c 6.4  Mr. Tise had improvement in a nonischemic cardiomyopathy with EF that was near normal in 2016.  He also had normal coronaries by cath at that time.  He has had no further shortness of breath or chest pain issues.  His cholesterol remains elevated with LDL 150 is on pravastatin.  He cannot tolerate any more potent statins.  We will need to repeat that since his last study was in October however he might be a candidate for PCSK9 inhibitor.  Weight has come down somewhat however he still remains mildly obese.  Hemoglobin A1c is reasonably well controlled however was lower in October at 6.0.  I have encouraged continued activity, diet and weight loss.  Follow-up with me annually or sooner as necessary.  COVID-19 Education: The signs and symptoms of  COVID-19 were discussed with the patient and how to seek care for testing (follow up with PCP or arrange E-visit).  The importance of social distancing was discussed today.  Patient Risk:   After full review of this patients clinical status, I feel that they are at least moderate risk at this time.  Time:   Today, I have spent 25 minutes with the patient with telehealth technology discussing dyslipidemia, type 2 diabetes, nonischemic cardiomyopathy.     Medication Adjustments/Labs and Tests Ordered: Current medicines are reviewed at length with the patient today.  Concerns regarding medicines are outlined above.   Tests Ordered: No orders of the defined types were placed in this encounter.   Medication Changes: No orders of the defined types were placed in this encounter.   Disposition:  in 1 year(s)  Pixie Casino, MD, [facility], [place] Director of the Advanced Lipid Disorders &  Cardiovascular Risk Reduction Clinic Diplomate of the American Board of Clinical Lipidology Attending Cardiologist  Direct Dial: [phone]  Fax: [phone]  Website:  [email or web address]  Pixie Casino, MD  08/01/2019 8:37 AM

## 2019-08-01 NOTE — Patient Instructions (Signed)
Medication Instructions:  Your physician recommends that you continue on your current medications as directed. Please refer to the Current Medication list given to you today.  *If you need a refill on your cardiac medications before your next appointment, please call your pharmacy*   Lab Work: FASTING lipid panel to check cholesterol   If you have labs (blood work) drawn today and your tests are completely normal, you will receive your results only by: Marland Kitchen MyChart Message (if you have MyChart) OR . A paper copy in the mail If you have any lab test that is abnormal or we need to change your treatment, we will call you to review the results.   Testing/Procedures: NONE   Follow-Up: At [facility], you and your health needs are our priority.  As part of our continuing mission to provide you with exceptional heart care, we have created designated Provider Care Teams.  These Care Teams include your primary Cardiologist (physician) and Advanced Practice Providers (APPs -  Physician Assistants and Nurse Practitioners) who all work together to provide you with the care you need, when you need it.  We recommend signing up for the patient portal called "MyChart".  Sign up information is provided on this After Visit Summary.  MyChart is used to connect with patients for Virtual Visits (Telemedicine).  Patients are able to view lab/test results, encounter notes, upcoming appointments, etc.  Non-urgent messages can be sent to your provider as well.   To learn more about what you can do with MyChart, go to [email or web address].    Your next appointment:   12 month(s)  The format for your next appointment:   In Person  Provider:   You may see Dr. Lyman Bishop or one of the following Advanced Practice Providers on your designated Care Team:    Almyra Deforest, PA-C  Fabian Sharp, [place] or   Roby Lofts, [place]    Other Instructions

## 2019-08-07 LAB — LIPID PANEL
Chol/HDL Ratio: 4.4 ratio (ref 0.0–5.0)
Cholesterol, Total: 168 mg/dL (ref 100–199)
HDL: 38 mg/dL — ABNORMAL LOW (ref >39)
LDL Chol Calc (NIH): 105 mg/dL — ABNORMAL HIGH (ref 0–99)
Triglycerides: 139 mg/dL (ref 0–149)
VLDL Cholesterol Cal: 25 mg/dL (ref 5–40)

## 2019-09-09 ENCOUNTER — Other Ambulatory Visit: Payer: Self-pay | Admitting: Internal Medicine

## 2019-09-09 DIAGNOSIS — I10 Essential (primary) hypertension: Secondary | ICD-10-CM

## 2019-09-09 DIAGNOSIS — I428 Other cardiomyopathies: Secondary | ICD-10-CM

## 2019-12-23 ENCOUNTER — Other Ambulatory Visit: Payer: Self-pay | Admitting: Urology

## 2019-12-23 DIAGNOSIS — N281 Cyst of kidney, acquired: Secondary | ICD-10-CM

## 2019-12-30 ENCOUNTER — Ambulatory Visit (HOSPITAL_COMMUNITY): Payer: Medicare Other

## 2020-01-09 ENCOUNTER — Telehealth: Payer: Self-pay

## 2020-01-09 NOTE — Telephone Encounter (Signed)
   [facility] Pre-operative Risk Assessment   Request for surgical clearance:  1. What type of surgery is being performed? LEFT TOTAL SHOULDER ARTHROPLASTY   2. When is this surgery scheduled? TBD   3. What type of clearance is required (medical clearance vs. Pharmacy clearance to hold med vs. Both)? MEDICAL  4. Are there any medications that need to be held prior to surgery and how long? NONE LISTED   5. Practice name and name of physician performing surgery? [facility] SUPPLE ATTN:KELLY   6. What is the office phone number? [phone]   7.   What is the office fax number? [phone]  8.   Anesthesia type (None, local, MAC, general) ? GENERAL

## 2020-01-09 NOTE — Telephone Encounter (Signed)
CALLED PT AND SCHEDULED PRE-OP CLEARANCE 01/15/2020@9 :10 AM w/Hao Eulas Post, PA. HE WILL ARRIVE EARLY W/INS CARD AND WEARING A MASK.

## 2020-01-14 ENCOUNTER — Other Ambulatory Visit: Payer: Self-pay

## 2020-01-14 ENCOUNTER — Ambulatory Visit: Payer: Medicare Other | Admitting: Internal Medicine

## 2020-01-14 VITALS — BP 134/70 | HR 75 | Ht 70.0 in | Wt 230.0 lb

## 2020-01-14 DIAGNOSIS — E782 Mixed hyperlipidemia: Secondary | ICD-10-CM | POA: Diagnosis not present

## 2020-01-14 DIAGNOSIS — Z01810 Encounter for preprocedural cardiovascular examination: Secondary | ICD-10-CM

## 2020-01-14 DIAGNOSIS — I428 Other cardiomyopathies: Secondary | ICD-10-CM | POA: Diagnosis not present

## 2020-01-14 NOTE — Patient Instructions (Addendum)
Medication Instructions:  Your physician recommends that you continue on your current medications as directed. Please refer to the Current Medication list given to you today.  *If you need a refill on your cardiac medications before your next appointment, please call your pharmacy*   Follow-Up: At [facility], you and your health needs are our priority.  As part of our continuing mission to provide you with exceptional heart care, we have created designated Provider Care Teams.  These Care Teams include your primary Cardiologist (physician) and Advanced Practice Providers (APPs -  Physician Assistants and Nurse Practitioners) who all work together to provide you with the care you need, when you need it.  We recommend signing up for the patient portal called "MyChart".  Sign up information is provided on this After Visit Summary.  MyChart is used to connect with patients for Virtual Visits (Telemedicine).  Patients are able to view lab/test results, encounter notes, upcoming appointments, etc.  Non-urgent messages can be sent to your provider as well.   To learn more about what you can do with MyChart, go to [email or web address].    Your next appointment:   12 month(s)  The format for your next appointment:   In Person  Provider:   You may see Dr. Debara Pickett or one of the following Advanced Practice Providers on your designated Care Team:    Almyra Deforest, PA-C  Fabian Sharp, PA-C or   Roby Lofts, [place]    Other Instructions  OK to hold aspirin 7 days prior to surgery if needed

## 2020-01-14 NOTE — Progress Notes (Signed)
OFFICE NOTE  Chief Complaint:  Preoperative risk assessment  Primary Care Physician: Lavone Orn, MD  HPI:  Frederick Keller is a 64 y.o. male who presents for follow up post hospitalization for significant chest pain and EF found by echo to be 25-30%. CARDIAC CATH showed EF 45% and patent coronary arteries. He did have an episode during hospitalization of SVT rate of 170. No further episodes that the pt is aware of.It is possible that he is having episodes of SVT which led to a nonischemic cardiomyopathy. He subsequently was started on metoprolol and has been started on lisinopril as well. Overall he feels well and denies any chest pain or worsening shortness of breath. His weight is been stable and monitored on a daily basis. He is not on a diuretic due to compensated symptoms.  I saw Mr. [name] in back in the office today. He reports feeling well except he does have a dry, nagging cough. He is on lisinopril 2.5 mg twice daily. A repeat echo recently which was a limited study does indicate EF is improved now up to 50-55%. This is reassuring and I've encouraged him to continue to stay on his heart failure medications. In fact at this point I feel there is room to increase the dose however I'm concerned about possible ACE inhibitor side effects.   Mr. [name] returns today for follow-up. He's had no recurrent SVT. He denies any palpitations or chest pain. He has no shortness of breath. He was having cough and I switched him off of lisinopril to an ARB. He thinks that medicine made him cough more although it may be some other reason he was having cough because he stopped it and eventually the cough went away. He is now gone back to lisinopril and he is having no side effects from the medicine. Blood pressure is low normal today but he is asymptomatic with it. Generally feels well. EF is remain stable and is at 50-55%.  05/09/2016  Mr. [name] was seen today in follow-up. He is complaining of  some sinus congestion. He denies any worsening shortness of breath or chest pain. He's done well on his current medications for heart failure. EF had improved up to 50-55%. He is asymptomatic and can exercise without difficulty. Blood pressure is well-controlled. Is stable but could be improved.  05/16/2017  Mr. [name] returns today for annual follow-up.  He denies any chest pain or worsening shortness of breath.  Blood pressure is well-controlled today 110/74.  He reports compliance with his losartan and metoprolol.  LV function has normalized.  EKG shows sinus rhythm at 67.  05/24/2018  Mr. [name] is here today for routine follow-up.  Overall he is done well without any cardiac complaints.  Over the past year he had experienced some cough and was attributed possibly to lisinopril.  He was taken off of that and switch to losartan and his cough did go away.  Recent labs from September 2019 showed total cholesterol 161, HDL 53, LDL 81 and triglycerides under 35.  Hemoglobin A1c of 6.6.  01/14/2020  Mr. [name] returns today for follow-up. Overall he continues to do well. He is been highly active except this past August he developed what sounds like an acute inflammatory arthritis of his left shoulder. After further evaluation it appears that he needs shoulder replacement. He scheduled to have this at the end of this month with Dr. Onnie Graham. He is here for cardiac clearance today. He denies any shortness of  breath and is able to walk up 2 flights of stairs without stopping, continues to be physically active and denies any chest pain symptoms. His last echo was in 2016 but showed normalization of LV function and has had no more than class I heart failure symptoms since then.  PMHx:  Past Medical History:  Diagnosis Date   Arthritis    Diabetes mellitus    type 2-diet controlled   Essential hypertension 05/09/2016   Gout    High cholesterol    History of kidney stones    Nodule of right lung     lower-being monitored    Past Surgical History:  Procedure Laterality Date   ANKLE SURGERY Right 1970   JOINT REPLACEMENT     rt knee-2008   KNEE ARTHROSCOPY Bilateral    x2 right, x1 left   LEFT HEART CATHETERIZATION WITH CORONARY ANGIOGRAM N/A 06/30/2014   Procedure: LEFT HEART CATHETERIZATION WITH CORONARY ANGIOGRAM;  Surgeon: Troy Sine, MD;  Location: [facility] CATH LAB;  Service: Cardiovascular;  Laterality: N/A;   LITHOTRIPSY     NASAL SEPTUM SURGERY  1997   NASAL SINUS SURGERY  1972   reconstruction with tonsils   NOSE SURGERY  1960   from horse accident   TONSILLECTOMY  age 39   TOTAL KNEE ARTHROPLASTY Left 02/25/2014   Procedure: LEFT TOTAL KNEE ARTHROPLASTY;  Surgeon: Mauri Pole, MD;  Location: [facility] ORS;  Service: Orthopedics;  Laterality: Left;    FAMHx:  Family History  Problem Relation Age of Onset   CAD Mother    Heart disease Mother    Dementia Mother    CAD Father    Heart attack Father    Healthy Sister    Healthy Brother    Healthy Sister    Diabetes Brother    Kidney cancer Brother    Healthy Sister     SOCHx:   reports that he quit smoking about 23 years ago. His smoking use included cigarettes. He quit after 30.00 years of use. He has never used smokeless tobacco. He reports that he does not drink alcohol and does not use drugs.  ALLERGIES:  Allergies  Allergen Reactions   Dilaudid [Hydromorphone Hcl] Nausea And Vomiting   Hydromorphone Nausea Only   Penicillins Hives   Lisinopril Cough    ROS: Pertinent items noted in HPI and remainder of comprehensive ROS otherwise negative.  HOME MEDS: Current Outpatient Medications  Medication Sig Dispense Refill   allopurinol (ZYLOPRIM) 300 MG tablet Take 300 mg by mouth at bedtime.      aspirin 81 MG tablet Take 81 mg by mouth daily.     cholecalciferol (VITAMIN D) 1000 UNITS tablet Take 1,000 Units by mouth at bedtime.      cyanocobalamin (,VITAMIN B-12,) 1000 MCG/ML  injection Inject 1,000 mcg into the muscle every 30 (thirty) days. 2nd Wednesday of the month.     cyclobenzaprine (FLEXERIL) 5 MG tablet Take 5 mg by mouth 3 (three) times daily as needed.     fexofenadine (ALLEGRA) 180 MG tablet Take 180 mg by mouth daily.     HYDROcodone-acetaminophen (NORCO/VICODIN) 5-325 MG tablet Take 1 tablet by mouth 2 (two) times daily as needed.     losartan (COZAAR) 25 MG tablet Take 12.5 mg by mouth daily. 1/2 TABLET (12.5MG ) DAILY     metoprolol tartrate (LOPRESSOR) 25 MG tablet TAKE 1 TABLET BY MOUTH  TWICE DAILY 180 tablet 3   pravastatin (PRAVACHOL) 40 MG tablet Take 40 mg  by mouth daily.     tamsulosin (FLOMAX) 0.4 MG CAPS capsule Take 0.4 mg by mouth daily as needed (kidney stones).      Current Facility-Administered Medications  Medication Dose Route Frequency Provider Last Rate Last Admin   triamcinolone acetonide (KENALOG) 10 MG/ML injection 10 mg  10 mg Other Once Wallene Huh, DPM        LABS/IMAGING: No results found for this or any previous visit (from the past 48 hour(s)). No results found.  WEIGHTS: Wt Readings from Last 3 Encounters:  01/14/20 230 lb (104.3 kg)  08/01/19 232 lb (105.2 kg)  05/24/18 241 lb (109.3 kg)    VITALS: BP 134/70    Pulse 75    Ht 5\' 10"  (1.778 m)    Wt 230 lb (104.3 kg)    SpO2 93%    BMI 33.00 kg/m   EXAM: General appearance: alert and no distress Neck: no carotid bruit and no JVD Lungs: clear to auscultation bilaterally Heart: regular rate and rhythm, S1, S2 normal, no murmur, click, rub or gallop Abdomen: soft, non-tender; bowel sounds normal; no masses,  no organomegaly Extremities: extremities normal, atraumatic, no cyanosis or edema Pulses: 2+ and symmetric Skin: Skin color, texture, turgor normal. No rashes or lesions Neurologic: Grossly normal Psych: Normal  EKG: Normal sinus rhythm 75, nonspecific T wave changes-personally reviewed  ASSESSMENT: 1. Nonischemic cardiomyopathy- EF now  50-55% by echo (2016) 2. Well compensated with [place] heart association class I symptoms 3. Dyslipidemia 4. Obesity with recent weight loss  PLAN: 1.   Mr. Withey is doing well unfortunately though has an arthritis of his left shoulder and is scheduled for shoulder replacement. From a cardiac standpoint I feel that he is at acceptable risk for this procedure needs no further testing prior to it. He does take aspirin and if necessary could hold that up to a week prior to the procedure. He has had recent weight loss which I commended him on. He should continue his current medications. LDL in May was 105 which is near goal. No changes in his medicines today. Follow-up with me annually or sooner as necessary.  Pixie Casino, MD, [facility], [place] Director of the [facility] &  Cardiovascular Risk Reduction [facility] Diplomate of the American Board of Clinical Lipidology Attending Cardiologist  Direct Dial: [phone]   Fax: [phone]  Website:  [email or web address] 01/14/2020, 8:26 AM

## 2020-01-15 ENCOUNTER — Ambulatory Visit: Payer: Medicare Other | Admitting: Physician Assistant

## 2020-01-16 NOTE — Progress Notes (Signed)
Please place surgical orders in epic for surgery scheduled for 01/30/2020. Patient has PAT appt for Friday 01/17/2020. Thanks.

## 2020-01-16 NOTE — Patient Instructions (Addendum)
DUE TO COVID-19 ONLY ONE VISITOR IS ALLOWED TO COME WITH YOU AND STAY IN THE WAITING ROOM ONLY DURING PRE OP AND PROCEDURE DAY OF SURGERY. THE 1 VISITOR  MAY VISIT WITH YOU AFTER SURGERY IN YOUR PRIVATE ROOM DURING VISITING HOURS ONLY!  YOU NEED TO HAVE A COVID 19 TEST ON Monday October 25, 20212 @___10 :30____, THIS TEST MUST BE DONE BEFORE SURGERY,  COVID TESTING SITE [address] [place] [address], IT IS ON THE RIGHT GOING OUT [address] APPROXIMATELY  2 MINUTES PAST [employer] ON THE RIGHT. ONCE YOUR COVID TEST IS COMPLETED,  PLEASE BEGIN THE QUARANTINE INSTRUCTIONS AS OUTLINED IN YOUR HANDOUT.                Frederick Keller  01/16/2020   Your procedure is scheduled on: Thursday January 30, 2020   Report to [facility] Main  Entrance   Report to admitting at 0730 AM     Call this number if you have problems the morning of surgery [phone]    REMEMBER: NO SOLID FOOD CANDY OR GUM AFTER MIDNIGHT. CLEAR LIQUIDS UNTIL 0700.  Then nothing by mouth      CLEAR LIQUID DIET   Foods Allowed                                                                    Coffee and tea, regular and decaf                            Fruit ices (not with fruit pulp)                                      Iced Popsicles                                    Carbonated beverages, regular and diet                                    Cranberry, grape and apple juices Sports drinks like Gatorade Lightly seasoned clear broth or consume(fat free) Sugar, honey syrup ___________________________________________________________________      BRUSH YOUR TEETH MORNING OF SURGERY AND RINSE YOUR MOUTH OUT, NO CHEWING GUM CANDY OR MINTS.     Take these medicines the morning of surgery with A SIP OF WATER: Fexofenadine (Allegra); Hydrocodone-Acetaminophen if needed; Metoprolol                                 You may not have any metal on your body including hair pins and               piercings  Do not wear jewelry, lotions, powders or colognes, deodorant                       Men may shave face and neck.   Do  not bring valuables to the hospital. [place].  Contacts, dentures or bridgework may not be worn into surgery.      Patients discharged the day of surgery will not be allowed to drive home. IF YOU ARE HAVING SURGERY AND GOING HOME THE SAME DAY, YOU MUST HAVE AN ADULT TO DRIVE YOU HOME AND BE WITH YOU FOR 24 HOURS. YOU MAY GO HOME BY TAXI OR UBER OR ORTHERWISE, BUT AN ADULT MUST ACCOMPANY YOU HOME AND STAY WITH YOU FOR 24 HOURS.  _____________________________________________________________________  [facility] - Preparing for Surgery Before surgery, you can play an important role.  Because skin is not sterile, your skin needs to be as free of germs as possible.  You can reduce the number of germs on your skin by washing with CHG (chlorahexidine gluconate) soap before surgery.  CHG is an antiseptic cleaner which kills germs and bonds with the skin to continue killing germs even after washing. Please DO NOT use if you have an allergy to CHG or antibacterial soaps.  If your skin becomes reddened/irritated stop using the CHG and inform your nurse when you arrive at Short Stay. Do not shave (including legs and underarms) for at least 48 hours prior to the first CHG shower.  You may shave your face/neck. Please follow these instructions carefully:  1.  Shower with CHG Soap the night before surgery and the  morning of Surgery.  2.  If you choose to wash your hair, wash your hair first as usual with your  normal  shampoo.  3.  After you shampoo, rinse your hair and body thoroughly to remove the  shampoo.                           4.  Use CHG as you would any other liquid soap.  You can apply chg directly  to the skin and wash                       Gently with a scrungie or clean washcloth.  5.  Apply the CHG Soap to  your body ONLY FROM THE NECK DOWN.   Do not use on face/ open                           Wound or open sores. Avoid contact with eyes, ears mouth and genitals (private parts).                       Wash face,  Genitals (private parts) with your normal soap.             6.  Wash thoroughly, paying special attention to the area where your surgery  will be performed.  7.  Thoroughly rinse your body with warm water from the neck down.  8.  DO NOT shower/wash with your normal soap after using and rinsing off  the CHG Soap.                9.  Pat yourself dry with a clean towel.            10.  Wear clean pajamas.            11.  Place clean sheets on your bed the night of your  first shower and do not  sleep with pets. Day of Surgery : Do not apply any lotions/deodorants the morning of surgery.  Please wear clean clothes to the hospital/surgery center.  FAILURE TO FOLLOW THESE INSTRUCTIONS MAY RESULT IN THE CANCELLATION OF YOUR SURGERY PATIENT SIGNATURE_________________________________  NURSE SIGNATURE__________________________________  ________________________________________________________________________

## 2020-01-17 ENCOUNTER — Encounter (HOSPITAL_COMMUNITY): Payer: Self-pay

## 2020-01-17 ENCOUNTER — Encounter (HOSPITAL_COMMUNITY)
Admission: RE | Admit: 2020-01-17 | Discharge: 2020-01-17 | Disposition: A | Discharge status: Home / Self Care | Payer: Medicare Other | Source: Ambulatory Visit | Attending: Orthopedic Surgery | Admitting: Orthopedic Surgery

## 2020-01-17 ENCOUNTER — Other Ambulatory Visit: Payer: Self-pay

## 2020-01-17 DIAGNOSIS — Z01812 Encounter for preprocedural laboratory examination: Secondary | ICD-10-CM | POA: Insufficient documentation

## 2020-01-17 HISTORY — DX: Heart failure, unspecified: I50.9

## 2020-01-17 LAB — CBC
HCT: 47.2 % (ref 39.0–52.0)
Hemoglobin: 15.4 g/dL (ref 13.0–17.0)
MCH: 32.4 pg (ref 26.0–34.0)
MCHC: 32.6 g/dL (ref 30.0–36.0)
MCV: 99.4 fL (ref 80.0–100.0)
Platelets: 170 10*3/uL (ref 150–400)
RBC: 4.75 MIL/uL (ref 4.22–5.81)
RDW: 13.2 % (ref 11.5–15.5)
WBC: 8.3 10*3/uL (ref 4.0–10.5)
nRBC: 0 % (ref 0.0–0.2)

## 2020-01-17 LAB — SURGICAL PCR SCREEN
MRSA, PCR: NEGATIVE
Staphylococcus aureus: NEGATIVE

## 2020-01-17 LAB — BASIC METABOLIC PANEL
Anion gap: 9 (ref 5–15)
BUN: 17 mg/dL (ref 8–23)
CO2: 32 mmol/L (ref 22–32)
Calcium: 10.1 mg/dL (ref 8.9–10.3)
Chloride: 99 mmol/L (ref 98–111)
Creatinine, Ser: 1.09 mg/dL (ref 0.61–1.24)
GFR, Estimated: >60 mL/min (ref >60)
Glucose, Bld: 134 mg/dL — ABNORMAL HIGH (ref 70–99)
Potassium: 4.8 mmol/L (ref 3.5–5.1)
Sodium: 140 mmol/L (ref 135–145)

## 2020-01-17 NOTE — Progress Notes (Addendum)
PCP - Lavone Orn Cardiologist - clearance 01-14-20 Dr Lyman Bishop epic  PPM/ICD -  Device Orders -  Rep Notified -   Chest x-ray -  EKG - 01-14-20 epic Stress Test -  ECHO - 2016 Cardiac Cath -   Sleep Study -  CPAP -   Fasting Blood Sugar -  Checks Blood Sugar _____ times a day  Blood Thinner Instructions: Aspirin Instructions:  ERAS Protcol - PRE-SURGERY Ensure or G2-   COVID TEST- 10-25  Activity can walk 2 flights of stairs without stopping Anesthesia review: CHF  Patient denies shortness of breath, fever, cough and chest pain at PAT appointment  NONE   All instructions explained to the patient, with a verbal understanding of the material. Patient agrees to go over the instructions while at home for a better understanding. Patient also instructed to self quarantine after being tested for COVID-19. The opportunity to ask questions was provided.

## 2020-01-18 LAB — HEMOGLOBIN A1C
Hgb A1c MFr Bld: 6.4 % — ABNORMAL HIGH (ref 4.8–5.6)
Mean Plasma Glucose: 137 mg/dL

## 2020-01-22 NOTE — Anesthesia Preprocedure Evaluation (Addendum)
Anesthesia Evaluation  Patient identified by MRN, date of birth, ID band Patient awake    Reviewed: Allergy & Precautions, NPO status , Patient's Chart, lab work & pertinent test results  Airway Mallampati: I  TM Distance: >3 FB Neck ROM: Full    Dental no notable dental hx. (+) Edentulous Upper, Edentulous Lower   Pulmonary neg pulmonary ROS, former smoker,    Pulmonary exam normal breath sounds clear to auscultation       Cardiovascular hypertension, Pt. on medications and Pt. on home beta blockers Normal cardiovascular exam Rhythm:Regular Rate:Normal  01-14-20 EKG SR R &% w NSST Changes  EF found by echo to be 25-30%.  CARDIAC CATH showed  EF 45% and patent coronary arteries.  He did have an episode during hospitalization of SVT rate of 170. No further episodes that  the pt is aware of.  It is possible that he is having episodes of SVT which led to a nonischemic cardiomyopathy   Neuro/Psych negative neurological ROS  negative psych ROS   GI/Hepatic   Endo/Other  diabetes  Renal/GU      Musculoskeletal  (+) Arthritis ,   Abdominal (+) + obese,   Peds  Hematology   Anesthesia Other Findings All PCN, Lisinopril  Reproductive/Obstetrics                          Anesthesia Physical Anesthesia Plan  ASA: III  Anesthesia Plan: General   Post-op Pain Management:    Induction: Intravenous  PONV Risk Score and Plan: 3 and Treatment may vary due to age or medical condition, Ondansetron, Dexamethasone and Midazolam  Airway Management Planned: Oral ETT  Additional Equipment: None  Intra-op Plan:   Post-operative Plan: Extubation in OR  Informed Consent: I have reviewed the patients History and Physical, chart, labs and discussed the procedure including the risks, benefits and alternatives for the proposed anesthesia with the patient or authorized representative who has indicated his/her  understanding and acceptance.     Dental advisory given  Plan Discussed with: Anesthesiologist and CRNA  Anesthesia Plan Comments: (GA w L ISB w [place]  Per cardiology 01/14/20, "Mr. Som is doing well unfortunately though has an arthritis of his left shoulder and is scheduled for shoulder replacement. From a cardiac standpoint I feel that he is at acceptable risk for this procedure needs no further testing prior to it. He does take aspirin and if necessary could hold that up to a week prior to the procedure. He has had recent weight loss which I commended him on. He should continue his current medications. LDL in May was 105 which is near goal. No changes in his medicines today. Follow-up with me annually or sooner as necessary.")     Anesthesia Quick Evaluation

## 2020-01-27 ENCOUNTER — Other Ambulatory Visit (HOSPITAL_COMMUNITY)
Admission: RE | Admit: 2020-01-27 | Discharge: 2020-01-27 | Disposition: A | Discharge status: Home / Self Care | Payer: Medicare Other | Source: Ambulatory Visit | Attending: Orthopedic Surgery | Admitting: Orthopedic Surgery

## 2020-01-27 DIAGNOSIS — Z01812 Encounter for preprocedural laboratory examination: Secondary | ICD-10-CM | POA: Diagnosis present

## 2020-01-27 DIAGNOSIS — Z20822 Contact with and (suspected) exposure to covid-19: Secondary | ICD-10-CM | POA: Diagnosis not present

## 2020-01-27 LAB — SARS CORONAVIRUS 2 (TAT 6-24 HRS): SARS Coronavirus 2: NEGATIVE

## 2020-01-30 ENCOUNTER — Encounter (HOSPITAL_COMMUNITY)
Admission: RE | Disposition: A | Discharge status: Home / Self Care | Payer: Self-pay | Source: Other Acute Inpatient Hospital | Attending: Orthopedic Surgery

## 2020-01-30 ENCOUNTER — Ambulatory Visit (HOSPITAL_COMMUNITY): Payer: Medicare Other | Admitting: Anesthesiology

## 2020-01-30 ENCOUNTER — Ambulatory Visit (HOSPITAL_COMMUNITY): Payer: Medicare Other | Admitting: Physician Assistant

## 2020-01-30 ENCOUNTER — Encounter (HOSPITAL_COMMUNITY): Payer: Self-pay | Admitting: Orthopedic Surgery

## 2020-01-30 ENCOUNTER — Ambulatory Visit (HOSPITAL_COMMUNITY)
Admission: RE | Admit: 2020-01-30 | Discharge: 2020-01-30 | Disposition: A | Discharge status: Home / Self Care | Payer: Medicare Other | Source: Other Acute Inpatient Hospital | Attending: Orthopedic Surgery | Admitting: Orthopedic Surgery

## 2020-01-30 DIAGNOSIS — E669 Obesity, unspecified: Secondary | ICD-10-CM | POA: Diagnosis not present

## 2020-01-30 DIAGNOSIS — Z7982 Long term (current) use of aspirin: Secondary | ICD-10-CM | POA: Insufficient documentation

## 2020-01-30 DIAGNOSIS — Z833 Family history of diabetes mellitus: Secondary | ICD-10-CM | POA: Insufficient documentation

## 2020-01-30 DIAGNOSIS — Z8249 Family history of ischemic heart disease and other diseases of the circulatory system: Secondary | ICD-10-CM | POA: Insufficient documentation

## 2020-01-30 DIAGNOSIS — I509 Heart failure, unspecified: Secondary | ICD-10-CM | POA: Insufficient documentation

## 2020-01-30 DIAGNOSIS — Z8379 Family history of other diseases of the digestive system: Secondary | ICD-10-CM | POA: Diagnosis not present

## 2020-01-30 DIAGNOSIS — Z87442 Personal history of urinary calculi: Secondary | ICD-10-CM | POA: Diagnosis not present

## 2020-01-30 DIAGNOSIS — Z8051 Family history of malignant neoplasm of kidney: Secondary | ICD-10-CM | POA: Diagnosis not present

## 2020-01-30 DIAGNOSIS — M1990 Unspecified osteoarthritis, unspecified site: Secondary | ICD-10-CM | POA: Insufficient documentation

## 2020-01-30 DIAGNOSIS — Z6833 Body mass index (BMI) 33.0-33.9, adult: Secondary | ICD-10-CM | POA: Diagnosis not present

## 2020-01-30 DIAGNOSIS — E119 Type 2 diabetes mellitus without complications: Secondary | ICD-10-CM | POA: Diagnosis not present

## 2020-01-30 DIAGNOSIS — Z96652 Presence of left artificial knee joint: Secondary | ICD-10-CM | POA: Insufficient documentation

## 2020-01-30 DIAGNOSIS — I428 Other cardiomyopathies: Secondary | ICD-10-CM | POA: Diagnosis not present

## 2020-01-30 DIAGNOSIS — M19012 Primary osteoarthritis, left shoulder: Secondary | ICD-10-CM | POA: Insufficient documentation

## 2020-01-30 DIAGNOSIS — E7800 Pure hypercholesterolemia, unspecified: Secondary | ICD-10-CM | POA: Insufficient documentation

## 2020-01-30 DIAGNOSIS — Z79899 Other long term (current) drug therapy: Secondary | ICD-10-CM | POA: Insufficient documentation

## 2020-01-30 DIAGNOSIS — Z87891 Personal history of nicotine dependence: Secondary | ICD-10-CM | POA: Insufficient documentation

## 2020-01-30 DIAGNOSIS — I110 Hypertensive heart disease with heart failure: Secondary | ICD-10-CM | POA: Diagnosis not present

## 2020-01-30 HISTORY — PX: TOTAL SHOULDER ARTHROPLASTY: SHX126

## 2020-01-30 LAB — GLUCOSE, CAPILLARY: Glucose-Capillary: 143 mg/dL — ABNORMAL HIGH (ref 70–99)

## 2020-01-30 SURGERY — ARTHROPLASTY, SHOULDER, TOTAL
Anesthesia: General | Site: Shoulder | Laterality: Left

## 2020-01-30 MED ORDER — LIDOCAINE 2% (20 MG/ML) 5 ML SYRINGE
INTRAMUSCULAR | Status: DC | PRN
  Administered 2020-01-30: 80 mg via INTRAVENOUS

## 2020-01-30 MED ORDER — BUPIVACAINE LIPOSOME 1.3 % IJ SUSP
INTRAMUSCULAR | Status: DC | PRN
  Administered 2020-01-30: 10 mL via PERINEURAL

## 2020-01-30 MED ORDER — 0.9 % SODIUM CHLORIDE (POUR BTL) OPTIME
TOPICAL | Status: DC | PRN
  Administered 2020-01-30: 1000 mL

## 2020-01-30 MED ORDER — FENTANYL CITRATE (PF) 100 MCG/2ML IJ SOLN
50.0000 ug | INTRAMUSCULAR | Status: DC
  Administered 2020-01-30: 50 ug via INTRAVENOUS
  Filled 2020-01-30: qty 2

## 2020-01-30 MED ORDER — LACTATED RINGERS IV SOLN: INTRAVENOUS | Status: DC

## 2020-01-30 MED ORDER — ONDANSETRON HCL 4 MG/2ML IJ SOLN
INTRAMUSCULAR | Status: AC
  Filled 2020-01-30: qty 2

## 2020-01-30 MED ORDER — STERILE WATER FOR IRRIGATION IR SOLN
Status: DC | PRN
  Administered 2020-01-30: 2000 mL

## 2020-01-30 MED ORDER — FENTANYL CITRATE (PF) 100 MCG/2ML IJ SOLN: 25.0000 ug | INTRAMUSCULAR | Status: DC | PRN

## 2020-01-30 MED ORDER — OXYCODONE HCL 5 MG PO TABS: 5.0000 mg | ORAL_TABLET | Freq: Once | ORAL | Status: DC | PRN

## 2020-01-30 MED ORDER — PHENYLEPHRINE HCL-NACL 10-0.9 MG/250ML-% IV SOLN
INTRAVENOUS | Status: DC | PRN
  Administered 2020-01-30: 25 ug/min via INTRAVENOUS

## 2020-01-30 MED ORDER — OXYCODONE HCL 5 MG/5ML PO SOLN: 5.0000 mg | Freq: Once | ORAL | Status: DC | PRN

## 2020-01-30 MED ORDER — TRANEXAMIC ACID-NACL 1000-0.7 MG/100ML-% IV SOLN
1000.0000 mg | INTRAVENOUS | Status: AC
  Administered 2020-01-30: 1000 mg via INTRAVENOUS
  Filled 2020-01-30: qty 100

## 2020-01-30 MED ORDER — FENTANYL CITRATE (PF) 100 MCG/2ML IJ SOLN
INTRAMUSCULAR | Status: AC
  Filled 2020-01-30: qty 2

## 2020-01-30 MED ORDER — NAPROXEN 500 MG PO TABS: 500.0000 mg | ORAL_TABLET | Freq: Two times a day (BID) | ORAL | 1 refills | Status: DC

## 2020-01-30 MED ORDER — SUGAMMADEX SODIUM 200 MG/2ML IV SOLN
INTRAVENOUS | Status: DC | PRN
  Administered 2020-01-30: 200 mg via INTRAVENOUS

## 2020-01-30 MED ORDER — CEFAZOLIN SODIUM-DEXTROSE 2-4 GM/100ML-% IV SOLN
2.0000 g | INTRAVENOUS | Status: AC
  Administered 2020-01-30: 2 g via INTRAVENOUS
  Filled 2020-01-30: qty 100

## 2020-01-30 MED ORDER — DEXAMETHASONE SODIUM PHOSPHATE 10 MG/ML IJ SOLN
INTRAMUSCULAR | Status: AC
  Filled 2020-01-30: qty 1

## 2020-01-30 MED ORDER — LACTATED RINGERS IV BOLUS
500.0000 mL | Freq: Once | INTRAVENOUS | Status: AC
  Administered 2020-01-30: 500 mL via INTRAVENOUS

## 2020-01-30 MED ORDER — PROPOFOL 10 MG/ML IV BOLUS
INTRAVENOUS | Status: DC | PRN
  Administered 2020-01-30: 150 mg via INTRAVENOUS

## 2020-01-30 MED ORDER — ROCURONIUM BROMIDE 10 MG/ML (PF) SYRINGE
PREFILLED_SYRINGE | INTRAVENOUS | Status: DC | PRN
  Administered 2020-01-30: 20 mg via INTRAVENOUS
  Administered 2020-01-30: 50 mg via INTRAVENOUS

## 2020-01-30 MED ORDER — PHENYLEPHRINE 40 MCG/ML (10ML) SYRINGE FOR IV PUSH (FOR BLOOD PRESSURE SUPPORT)
PREFILLED_SYRINGE | INTRAVENOUS | Status: DC | PRN
  Administered 2020-01-30 (×2): 80 ug via INTRAVENOUS

## 2020-01-30 MED ORDER — BUPIVACAINE-EPINEPHRINE (PF) 0.5% -1:200000 IJ SOLN
INTRAMUSCULAR | Status: DC | PRN
  Administered 2020-01-30: 15 mL via PERINEURAL

## 2020-01-30 MED ORDER — EPHEDRINE SULFATE-NACL 50-0.9 MG/10ML-% IV SOSY
PREFILLED_SYRINGE | INTRAVENOUS | Status: DC | PRN
  Administered 2020-01-30: 10 mg via INTRAVENOUS

## 2020-01-30 MED ORDER — PHENYLEPHRINE HCL (PRESSORS) 10 MG/ML IV SOLN
INTRAVENOUS | Status: AC
  Filled 2020-01-30: qty 1

## 2020-01-30 MED ORDER — LACTATED RINGERS IV BOLUS
250.0000 mL | Freq: Once | INTRAVENOUS | Status: AC
  Administered 2020-01-30: 250 mL via INTRAVENOUS

## 2020-01-30 MED ORDER — PROPOFOL 10 MG/ML IV BOLUS
INTRAVENOUS | Status: AC
  Filled 2020-01-30: qty 20

## 2020-01-30 MED ORDER — ONDANSETRON HCL 4 MG/2ML IJ SOLN: 4.0000 mg | Freq: Once | INTRAMUSCULAR | Status: DC | PRN

## 2020-01-30 MED ORDER — CHLORHEXIDINE GLUCONATE 0.12 % MT SOLN
15.0000 mL | Freq: Once | OROMUCOSAL | Status: AC
  Administered 2020-01-30: 15 mL via OROMUCOSAL

## 2020-01-30 MED ORDER — DEXAMETHASONE SODIUM PHOSPHATE 10 MG/ML IJ SOLN
INTRAMUSCULAR | Status: DC | PRN
  Administered 2020-01-30: 10 mg via INTRAVENOUS

## 2020-01-30 MED ORDER — CYCLOBENZAPRINE HCL 10 MG PO TABS: 10.0000 mg | ORAL_TABLET | Freq: Three times a day (TID) | ORAL | 1 refills | Status: DC | PRN

## 2020-01-30 MED ORDER — AMISULPRIDE (ANTIEMETIC) 5 MG/2ML IV SOLN: 10.0000 mg | Freq: Once | INTRAVENOUS | Status: DC | PRN

## 2020-01-30 MED ORDER — MIDAZOLAM HCL 2 MG/2ML IJ SOLN
1.0000 mg | INTRAMUSCULAR | Status: DC
  Administered 2020-01-30: 2 mg via INTRAVENOUS
  Filled 2020-01-30: qty 2

## 2020-01-30 MED ORDER — ACETAMINOPHEN 10 MG/ML IV SOLN: 1000.0000 mg | Freq: Once | INTRAVENOUS | Status: DC | PRN

## 2020-01-30 MED ORDER — ORAL CARE MOUTH RINSE: 15.0000 mL | Freq: Once | OROMUCOSAL | Status: AC

## 2020-01-30 MED ORDER — FENTANYL CITRATE (PF) 100 MCG/2ML IJ SOLN
INTRAMUSCULAR | Status: DC | PRN
  Administered 2020-01-30: 100 ug via INTRAVENOUS

## 2020-01-30 MED ORDER — ONDANSETRON HCL 4 MG PO TABS: 4.0000 mg | ORAL_TABLET | Freq: Three times a day (TID) | ORAL | 0 refills | Status: DC | PRN

## 2020-01-30 MED ORDER — OXYCODONE-ACETAMINOPHEN 5-325 MG PO TABS: 1.0000 | ORAL_TABLET | ORAL | 0 refills | Status: DC | PRN

## 2020-01-30 MED ORDER — LIDOCAINE 2% (20 MG/ML) 5 ML SYRINGE
INTRAMUSCULAR | Status: AC
  Filled 2020-01-30: qty 5

## 2020-01-30 SURGICAL SUPPLY — 71 items
ADH SKN CLS APL DERMABOND .7 (GAUZE/BANDAGES/DRESSINGS) ×1
AID PSTN UNV HD RSTRNT DISP (MISCELLANEOUS) ×1
BAG SPEC THK2 15X12 ZIP CLS (MISCELLANEOUS) ×1
BAG ZIPLOCK 12X15 (MISCELLANEOUS) ×3 IMPLANT
BIT DRILL 2.0X128 (BIT) ×2 IMPLANT
BIT DRILL 2.0X128MM (BIT) ×1
BIT DRILL 2.5 CANN ENDOSCOPIC (BIT) ×2 IMPLANT
BLADE SAW SGTL 83.5X18.5 (BLADE) ×3 IMPLANT
BODY TRUNION ECLIPSE 45 SL (Shoulder) ×2 IMPLANT
CEMENT BONE DEPUY (Cement) ×3 IMPLANT
COOLER ICEMAN CLASSIC (MISCELLANEOUS) IMPLANT
COVER BACK TABLE 60X90IN (DRAPES) ×3 IMPLANT
COVER SURGICAL LIGHT HANDLE (MISCELLANEOUS) ×3 IMPLANT
COVER WAND RF STERILE (DRAPES) IMPLANT
DERMABOND ADVANCED (GAUZE/BANDAGES/DRESSINGS) ×2
DERMABOND ADVANCED .7 DNX12 (GAUZE/BANDAGES/DRESSINGS) ×1 IMPLANT
DRAPE ORTHO SPLIT 77X108 STRL (DRAPES) ×6
DRAPE SHEET LG 3/4 BI-LAMINATE (DRAPES) ×3 IMPLANT
DRAPE SURG 17X11 SM STRL (DRAPES) ×3 IMPLANT
DRAPE SURG ORHT 6 SPLT 77X108 (DRAPES) ×2 IMPLANT
DRAPE U-SHAPE 47X51 STRL (DRAPES) ×3 IMPLANT
DRSG AQUACEL AG ADV 3.5X10 (GAUZE/BANDAGES/DRESSINGS) ×3 IMPLANT
DRSG TEGADERM 4X4.75 (GAUZE/BANDAGES/DRESSINGS) ×2 IMPLANT
DURAPREP 26ML APPLICATOR (WOUND CARE) ×3 IMPLANT
ELECT BLADE TIP CTD 4 INCH (ELECTRODE) ×3 IMPLANT
ELECT REM PT RETURN 15FT ADLT (MISCELLANEOUS) ×3 IMPLANT
FACESHIELD WRAPAROUND (MASK) ×12 IMPLANT
FACESHIELD WRAPAROUND OR TEAM (MASK) ×4 IMPLANT
GLENOID UNI VAULTLOCK LRG (Shoulder) ×2 IMPLANT
GLOVE BIO SURGEON STRL SZ7.5 (GLOVE) ×3 IMPLANT
GLOVE BIO SURGEON STRL SZ8 (GLOVE) ×3 IMPLANT
GLOVE SS BIOGEL STRL SZ 7 (GLOVE) ×1 IMPLANT
GLOVE SS BIOGEL STRL SZ 7.5 (GLOVE) ×1 IMPLANT
GLOVE SUPERSENSE BIOGEL SZ 7 (GLOVE) ×4
GLOVE SUPERSENSE BIOGEL SZ 7.5 (GLOVE) ×2
GOWN STRL REUS W/TWL LRG LVL3 (GOWN DISPOSABLE) ×6 IMPLANT
HEAD HUMERAL ECLIPSE 47/18 (Shoulder) ×2 IMPLANT
IMPL ECLIPSE SPEEDCAP (Shoulder) IMPLANT
IMPLANT ECLIPSE SPEEDCAP (Shoulder) ×3 IMPLANT
KIT BASIN OR (CUSTOM PROCEDURE TRAY) ×3 IMPLANT
KIT TURNOVER KIT A (KITS) IMPLANT
MANIFOLD NEPTUNE II (INSTRUMENTS) ×3 IMPLANT
NDL TAPERED W/ NITINOL LOOP (MISCELLANEOUS) IMPLANT
NEEDLE TAPERED W/ NITINOL LOOP (MISCELLANEOUS) IMPLANT
NS IRRIG 1000ML POUR BTL (IV SOLUTION) ×3 IMPLANT
PACK SHOULDER (CUSTOM PROCEDURE TRAY) ×3 IMPLANT
PAD COLD SHLDR WRAP-ON (PAD) ×2 IMPLANT
PIN NITINOL TARGETER 2.8 (PIN) IMPLANT
PIN SET MODULAR GLENOID SYSTEM (PIN) IMPLANT
PROTECTOR NERVE ULNAR (MISCELLANEOUS) ×3 IMPLANT
RESTRAINT HEAD UNIVERSAL NS (MISCELLANEOUS) ×3 IMPLANT
SCREW MED ECLIPSE 35 (Screw) ×2 IMPLANT
SIZER ECLIPSE CAGE SCREW (ORTHOPEDIC DISPOSABLE SUPPLIES) ×2 IMPLANT
SLING ARM FOAM STRAP LRG (SOFTGOODS) ×2 IMPLANT
SLING ARM FOAM STRAP MED (SOFTGOODS) IMPLANT
SMARTMIX MINI TOWER (MISCELLANEOUS) ×3
SPONGE LAP 18X18 RF (DISPOSABLE) IMPLANT
SUCTION FRAZIER HANDLE 12FR (TUBING) ×3
SUCTION TUBE FRAZIER 12FR DISP (TUBING) ×1 IMPLANT
SUT FIBERWIRE #2 38 T-5 BLUE (SUTURE)
SUT MNCRL AB 3-0 PS2 18 (SUTURE) ×3 IMPLANT
SUT MON AB 2-0 CT1 36 (SUTURE) ×3 IMPLANT
SUT VIC AB 1 CT1 36 (SUTURE) ×3 IMPLANT
SUTURE FIBERWR #2 38 T-5 BLUE (SUTURE) IMPLANT
SUTURE TAPE 1.3 40 TPR END (SUTURE) IMPLANT
SUTURETAPE 1.3 40 TPR END (SUTURE)
TOWEL OR 17X26 10 PK STRL BLUE (TOWEL DISPOSABLE) ×3 IMPLANT
TOWEL OR NON WOVEN STRL DISP B (DISPOSABLE) ×3 IMPLANT
TOWER SMARTMIX MINI (MISCELLANEOUS) IMPLANT
WATER STERILE IRR 1000ML POUR (IV SOLUTION) ×6 IMPLANT
YANKAUER SUCT BULB TIP 10FT TU (MISCELLANEOUS) ×2 IMPLANT

## 2020-01-30 NOTE — Op Note (Signed)
01/30/2020  12:55 PM  PATIENT:   Frederick Keller  64 y.o. male  PRE-OPERATIVE DIAGNOSIS:  left shoulder osteoarthritis  POST-OPERATIVE DIAGNOSIS: Same  PROCEDURE: Left shoulder anatomic arthroplasty utilizing a stemless size 45 trunnion with a medium cage screw and a 47 x 17 humeral head with a large glenoid  SURGEON:  Saksham Akkerman, Metta Clines M.D.  ASSISTANTS: Jenetta Loges, PA-C  ANESTHESIA:   General endotracheal and interscalene block with Exparel  EBL: 150 cc  SPECIMEN: None  Drains: None   PATIENT DISPOSITION:  PACU - hemodynamically stable.    PLAN OF CARE: Discharge to home after PACU  Brief history:  Patient is a 64 year old male who has had chronic and progressively increasing left shoulder pain related to advanced osteoarthritis.  His symptoms have failed to respond to prolonged attempts at conservative management and due to his increasing functional imitations he is brought to the operating room at this time for planned left shoulder anatomic arthroplasty.  Preoperatively, I counseled the patient regarding treatment options and risks versus benefits thereof.  Possible surgical complications were all reviewed including potential for bleeding, infection, neurovascular injury, persistent pain, loss of motion, anesthetic complication, failure of the implant, and possible need for additional surgery. They understand and accept and agrees with our planned procedure.   Procedure in detail:  After undergoing routine preop evaluation patient received prophylactic antibiotics and interscalene block with Exparel established in the holding area by the anesthesia department.  Patient was subsequently placed supine on the operative table and underwent the smooth induction of a general endotracheal anesthesia.  Placed in the beachchair position and appropriately padded and protected.  Left shoulder girdle region was sterilely prepped and draped in standard fashion.  Timeout was called.   An anterior deltopectoral approach left shoulder is made through a 12 cm incision.  Skin flaps were elevated dissection carried deeply and electrocautery was used for hemostasis.  The deltopectoral interval was then developed from proximal to distal with the vein taken laterally.  The upper centimeter of the pectoralis major was tenotomized for exposure and the conjoined tendon was mobilized retracted medially and adhesions were then divided beneath the deltoid.  The long head biceps tendon was then tenodesed at the upper border the pectoralis major tendon with proximal segment unroofed and excised.  The rotator cuff was then split from the apex of the bicipital groove to the base of the coracoid and the inferior extent of the subscap insertion was then exposed and we then used an oscillating saw to perform a lesser tuberosity osteotomy removing a thin wafer of bone and the subscap was intact and reflected medially.  Capsular attachments on the anterior and medial margins of the humeral neck were then divided in a subperiosteal fashion the humeral head was then delivered through the wound.  The extra medullary guide was then used to outline our proposed humeral head resection which was then performed at approximate 25 degrees of retroversion with care taken to protect the rotator cuff.  Rondure was then used remove the remaining osteophytes on the humeral neck.  We prepared the cut metaphyseal bone sizing with a 45 and placed our cage screw template and once this was completed a metal cap was then placed over the cut proximal humeral surface.  At this point we exposed the glenoid with appropriate retractors and performed a circumferential labral resection.  The glenoid was sized at a large and a guide was then used to direct a guidepin into the center of  the glenoid.  The glenoid was then reamed to a stable subchondral bony bed.  Terminal glenoid preparation was then completed with the appropriate drills and  punches.  Trial showed excellent fit.  Glenoid was then meticulously cleaned and dried.  Cement was introduced into the superior and inferior peg and slot respectively and bone graft was impacted around the central peg and the glenoid was then impacted with excellent fit and fixation.  We then returned our attention back to the proximal humerus where the metal cap was removed the size 45 trunnion was then impacted and the size medium cage screw was inserted after bone graft had been placed within the implant.  Excellent fixation was achieved.  We then performed a series of trial reductions and felt that the 47 x 17 head gave [place] the best soft tissue balance with 50% translation good soft tissue tension and stability and motion.  This point the trial was removed.  We placed our medial row anchors for the LTO repair and then meticulously cleaned the [facility] taper and impacted the final humeral head.  Final reduction was then performed.  We then confirmed that the subscapularis had good mobility and elasticity.  We then passed our medial row sutures through the bone tendon junction of the subscapularis and then these were delivered into 2 lateral anchors in an alternating fashion which allowed a double row repair which nicely reapproximated the LTO to the donor site on the humeral metaphysis and then the rotator interval was repaired with a series of figure-of-eight suture tape sutures.  Upon completion the arm easily achieved 45 degrees of external rotation without excessive tension on the subscap repair.  The wound was then copiously irrigated.  Hemostasis was obtained.  The deltopectoral interval was reapproximated with a series of figure-of-eight and 1 Vicryl sutures.  2-0 Vicryl used with subcu layer and intracuticular 3-0 Monocryl for the skin followed by Dermabond and Aquacel dressing.  The left arm was then placed into a sling.  The patient was awakened, extubated, and taken to the recovery room in stable  condition.  Jenetta Loges, PA-C was utilized as an Environmental consultant throughout this case, essential for help with positioning the patient, positioning extremity, tissue manipulation, implantation of the prosthesis, suture management, wound closure, and intraoperative decision-making.  Marin Shutter MD   Contact # [phone]

## 2020-01-30 NOTE — Evaluation (Signed)
Occupational Therapy Evaluation Patient Details Name: Frederick Keller MRN: [id number] DOB: July 04, 1955 Today's Date: 01/30/2020    History of Present Illness Left shoulder anatomic arthroplasty   Clinical Impression   Frederick Keller is a 64 year old man s/p shoulder replacement without functional use of left non-dominant upper extremity secondary to effects of surgery, interscalene block and shoulder precautions. Therapist provided education and instruction to patient and daughter in regards to exercises, precautions, positioning, donning upper extremity clothing and bathing while maintaining shoulder precautions, ice machine and donning/doffing sling. Patient and daughter verbalized understanding. Patient needed assistance to donn shirt, socks and shoes and able to donn boxers and pants.Patient to follow up with MD for further therapy needs.      Follow Up Recommendations  Follow surgeon's recommendation for DC plan and follow-up therapies    Equipment Recommendations       Recommendations for Other Services       Precautions / Restrictions Precautions Precautions: Shoulder Type of Shoulder Precautions: PROM: 45 abd, 20 ER, 60 FF Shoulder Interventions: Shoulder sling/immobilizer;At all times;Off for dressing/bathing/exercises Precaution Booklet Issued: Yes (comment) Restrictions Weight Bearing Restrictions: Yes LUE Weight Bearing: Non weight bearing      Mobility Bed Mobility                    Transfers                      Balance                                           ADL either performed or assessed with clinical judgement   ADL Overall ADL's : Needs assistance/impaired Eating/Feeding: Set up   Grooming: Modified independent   Upper Body Bathing: Minimal assistance   Lower Body Bathing: Modified independent   Upper Body Dressing : Moderate assistance   Lower Body Dressing: Modified independent   Toilet Transfer:  Modified Independent   Toileting- Clothing Manipulation and Hygiene: Modified independent               Vision Baseline Vision/History: Wears glasses Wears Glasses: At all times       Perception     Praxis      Pertinent Vitals/Pain Pain Assessment: No/denies pain     Hand Dominance     Extremity/Trunk Assessment Upper Extremity Assessment Upper Extremity Assessment: LUE deficits/detail LUE Deficits / Details: No active movement secondary to interscalene block       Cervical / Trunk Assessment Cervical / Trunk Assessment: Normal   Communication     Cognition Arousal/Alertness: Awake/alert Behavior During Therapy: WFL for tasks assessed/performed Overall Cognitive Status: Within Functional Limits for tasks assessed                                     General Comments       Exercises     Shoulder Instructions Shoulder Instructions Donning/doffing shirt without moving shoulder: Moderate assistance;Patient able to independently direct caregiver Method for sponge bathing under operated UE: Patient able to independently direct caregiver Donning/doffing sling/immobilizer: Moderate assistance;Patient able to independently direct caregiver Correct positioning of sling/immobilizer: Independent Pendulum exercises (written home exercise program): Independent ROM for elbow, wrist and digits of operated UE: Independent Sling wearing schedule (on at all times/off for  ADL's): Independent Proper positioning of operated UE when showering: Independent Dressing change: Independent Positioning of UE while sleeping: [place] expects to be discharged to:: Private residence Living Arrangements: Spouse/significant other Available Help at Discharge: Available 24 hours/day;Family Type of Home: [place]: One level     Bathroom Shower/Tub: Walk-in shower                    Prior Functioning/Environment                    OT Problem List: Decreased strength;Impaired UE functional use;Decreased range of motion      OT Treatment/Interventions:      OT Goals(Current goals can be found in the care plan section) Acute Rehab OT Goals OT Goal Formulation: Patient unable to participate in goal setting  OT Frequency:     Barriers to D/C:            Co-evaluation              AM-PAC OT "6 Clicks" Daily Activity     Outcome Measure Help from another person eating meals?: A Little Help from another person taking care of personal grooming?: None Help from another person toileting, which includes using toliet, bedpan, or urinal?: None Help from another person bathing (including washing, rinsing, drying)?: None Help from another person to put on and taking off regular upper body clothing?: A Lot Help from another person to put on and taking off regular lower body clothing?: None 6 Click Score: 21   End of Session Nurse Communication:  (OT education complete)  Activity Tolerance: Patient tolerated treatment well Patient left: in chair  OT Visit Diagnosis: Muscle weakness (generalized) (M62.81)                Time: 4707-6151 OT Time Calculation (min): 27 min Charges:  OT General Charges $OT Visit: 1 Visit OT Evaluation $OT Eval Low Complexity: 1 Low OT Treatments $Self Care/Home Management : 8-22 mins  Frederick Keller, OTR/L Acute Care Rehab Services  Office [phone] Pager: [phone]   Lenward Chancellor 01/30/2020, 4:01 PM

## 2020-01-30 NOTE — Anesthesia Procedure Notes (Signed)
Procedure Name: Intubation Date/Time: 01/30/2020 10:57 AM Performed by: Gerald Leitz, CRNA Pre-anesthesia Checklist: Patient identified, Patient being monitored, Timeout performed, Emergency Drugs available and Suction available Patient Re-evaluated:Patient Re-evaluated prior to induction Oxygen Delivery Method: Circle system utilized Preoxygenation: Pre-oxygenation with 100% oxygen Induction Type: IV induction Ventilation: Mask ventilation without difficulty Laryngoscope Size: Mac and 3 Grade View: Grade I Tube type: Oral Tube size: 7.5 mm Number of attempts: 1 Placement Confirmation: ETT inserted through vocal cords under direct vision,  positive ETCO2 and breath sounds checked- equal and bilateral Secured at: 21 cm Tube secured with: Tape Dental Injury: Teeth and Oropharynx as per pre-operative assessment

## 2020-01-30 NOTE — Progress Notes (Signed)
Assisted Dr. Valma Cava with left, ultrasound guided, interscalene  block. Side rails up, monitors on throughout procedure. See vital signs in flow sheet. Tolerated Procedure well.

## 2020-01-30 NOTE — Transfer of Care (Signed)
Immediate Anesthesia Transfer of Care Note  Patient: Frederick Keller  Procedure(s) Performed: Procedure(s) with comments: TOTAL SHOULDER ARTHROPLASTY (Left) - 137min  Patient Location: PACU  Anesthesia Type:General  Level of Consciousness: Alert, Awake, Oriented  Airway & Oxygen Therapy: Patient Spontanous Breathing  Post-op Assessment: Report given to RN  Post vital signs: Reviewed and stable  Last Vitals:  Vitals:   01/30/20 0954 01/30/20 0959  BP:    Pulse: 64 62  Resp: (!) 21 20  Temp:    SpO2: 19% 16%    Complications: No apparent anesthesia complications

## 2020-01-30 NOTE — Anesthesia Procedure Notes (Signed)
Anesthesia Regional Block: Interscalene brachial plexus block   Pre-Anesthetic Checklist: ,, timeout performed, Correct Patient, Correct Site, Correct Laterality, Correct Procedure, Correct Position, site marked, Risks and benefits discussed,  Surgical consent,  Pre-op evaluation,  At surgeon's request and post-op pain management  Laterality: Upper and Left  Prep: Maximum Sterile Barrier Precautions used, chloraprep       Needles:  Injection technique: Single-shot  Needle Type: Echogenic Needle     Needle Length: 5cm  Needle Gauge: 21     Additional Needles:   Procedures:,,,, ultrasound used (permanent image in chart),,,,  Narrative:  Start time: 01/30/2020 8:40 AM End time: 01/30/2020 8:48 AM Injection made incrementally with aspirations every 5 mL.  Performed by: Personally  Anesthesiologist: Barnet Glasgow, MD  Additional Notes: Block assessed prior to procedure. Patient tolerated procedure well.

## 2020-01-30 NOTE — Discharge Instructions (Signed)
 Kevin M. Supple, M.D., F.A.A.O.S. Orthopaedic Surgery Specializing in Arthroscopic and Reconstructive Surgery of the Shoulder [phone] [address] - [place], [place] [address] - Fax [phone]   POST-OP TOTAL SHOULDER REPLACEMENT INSTRUCTIONS  1. Follow up in the office for your first post-op appointment 10-14 days from the date of your surgery. If you do not already have a scheduled appointment, our office will contact you to schedule.  2. The bandage over your incision is waterproof. You may begin showering with this dressing on. You may leave this dressing on until first follow up appointment within 2 weeks. We prefer you leave this dressing in place until follow up however after 5-7 days if you are having itching or skin irritation and would like to remove it you may do so. Go slow and tug at the borders gently to break the bond the dressing has with the skin. At this point if there is no drainage it is okay to go without a bandage or you may cover it with a light guaze and tape. You can also expect significant bruising around your shoulder that will drift down your arm and into your chest wall. This is very normal and should resolve over several days.   3. Wear your sling/immobilizer at all times except to perform the exercises below or to occasionally let your arm dangle by your side to stretch your elbow. You also need to sleep in your sling immobilizer until instructed otherwise. It is ok to remove your sling if you are sitting in a controlled environment and allow your arm to rest in a position of comfort by your side or on your lap with pillows to give your neck and skin a break from the sling. You may remove it to allow arm to dangle by side to shower. If you are up walking around and when you go to sleep at night you need to wear it.  4. Range of motion to your elbow, wrist, and hand are encouraged 3-5 times daily. Exercise to your hand and fingers helps to reduce  swelling you may experience.   5. Prescriptions for a pain medication and a muscle relaxant are provided for you. It is recommended that if you are experiencing pain that you pain medication alone is not controlling, add the muscle relaxant along with the pain medication which can give additional pain relief. The first 1-2 days is generally the most severe of your pain and then should gradually decrease. As your pain lessens it is recommended that you decrease your use of the pain medications to an "as needed basis'" only and to always comply with the recommended dosages of the pain medications.  6. Pain medications can produce constipation along with their use. If you experience this, the use of an over the counter stool softener or laxative daily is recommended.   7. For additional questions or concerns, please do not hesitate to call the office. If after hours there is an answering service to forward your concerns to the physician on call.  8.Pain control following an exparel block  To help control your post-operative pain you received a nerve block  performed with Exparel which is a long acting anesthetic (numbing agent) which can provide pain relief and sensations of numbness (and relief of pain) in the operative shoulder and arm for up to 3 days. Sometimes it provides mixed relief, meaning you may still have numbness in certain areas of the arm but can still be able to   move  parts of that arm, hand, and fingers. We recommend that your prescribed pain medications  be used as needed. We do not feel it is necessary to "pre medicate" and "stay ahead" of pain.  Taking narcotic pain medications when you are not having any pain can lead to unnecessary and potentially dangerous side effects.    9. Use the ice machine as much as possible in the first 5-7 days from surgery, then you can wean its use to as needed. The ice typically needs to be replaced every 6 hours, instead of ice you can actually freeze  water bottles to put in the cooler and then fill water around them to avoid having to purchase ice. You can have spare water bottles freezing to allow you to rotate them once they have melted. Try to have a thin shirt or light cloth or towel under the ice wrap to protect your skin.   10.  We recommend that you avoid any dental work or cleaning in the first 3 months following your joint replacement. This is to help minimize the possibility of infection from the bacteria in your mouth that enters your bloodstream during dental work. We also recommend that you take an antibiotic prior to your dental work for the first year after your shoulder replacement to further help reduce that risk. Please simply contact our office for antibiotics to be sent to your pharmacy prior to dental work.  11. Dental Antibiotics:  In most cases prophylactic antibiotics for Dental procdeures after total joint surgery are not necessary.  Exceptions are as follows:  1. History of prior total joint infection  2. Severely immunocompromised (Organ Transplant, cancer chemotherapy, Rheumatoid biologic meds such as Humera)  3. Poorly controlled diabetes (A1C &gt; 8.0, blood glucose over 200)  If you have one of these conditions, contact your surgeon for an antibiotic prescription, prior to your dental procedure.   POST-OP EXERCISES  Pendulum Exercises  Perform pendulum exercises while standing and bending at the waist. Support your uninvolved arm on a table or chair and allow your operated arm to hang freely. Make sure to do these exercises passively - not using you shoulder muscles. These exercises can be performed once your nerve block effects have worn off.  Repeat 20 times. Do 3 sessions per day.     

## 2020-01-30 NOTE — H&P (Signed)
Frederick Keller    Chief Complaint: left shoulder osteoarthritis HPI: The patient is a 64 y.o. male with chronic and progressively increasing left shoulder pain and associated functional limitations related to advanced osteoarthritis.  Due to his increasing difficulties and failure to respond to prolonged attempts at conservative management he has brought to the operating room at this time for planned left total shoulder arthroplasty.  Past Medical History:  Diagnosis Date  . Arthritis   . CHF (congestive heart failure) ([place])   . Diabetes mellitus    type 2-diet controlled  . Essential hypertension 05/09/2016  . Gout   . High cholesterol   . History of kidney stones   . Nodule of right lung    lower-being monitored  and kidney for 5 year     Past Surgical History:  Procedure Laterality Date  . ANKLE SURGERY Right 1970  . JOINT REPLACEMENT     rt knee-2008  . KNEE ARTHROSCOPY Bilateral    x2 right, x1 left  . LEFT HEART CATHETERIZATION WITH CORONARY ANGIOGRAM N/A 06/30/2014   Procedure: LEFT HEART CATHETERIZATION WITH CORONARY ANGIOGRAM;  Surgeon: Troy Sine, MD;  Location: [facility] CATH LAB;  Service: Cardiovascular;  Laterality: N/A;  . LITHOTRIPSY    . NASAL SEPTUM SURGERY  1997  . NASAL SINUS SURGERY  1972   reconstruction with tonsils  . NOSE SURGERY  1960   from horse accident  . TONSILLECTOMY  age 34  . TOTAL KNEE ARTHROPLASTY Left 02/25/2014   Procedure: LEFT TOTAL KNEE ARTHROPLASTY;  Surgeon: Mauri Pole, MD;  Location: [facility] ORS;  Service: Orthopedics;  Laterality: Left;    Family History  Problem Relation Age of Onset  . CAD Mother   . Heart disease Mother   . Dementia Mother   . CAD Father   . Heart attack Father   . Healthy Sister   . Healthy Brother   . Healthy Sister   . Diabetes Brother   . Kidney cancer Brother   . Healthy Sister     Social History:  reports that he quit smoking about 23 years ago. His smoking use included cigarettes. He quit after  30.00 years of use. He has never used smokeless tobacco. He reports that he does not drink alcohol and does not use drugs.   Facility-Administered Medications Prior to Admission  Medication Dose Route Frequency Provider Last Rate Last Admin  . triamcinolone acetonide (KENALOG) 10 MG/ML injection 10 mg  10 mg Other Once Wallene Huh, DPM       Medications Prior to Admission  Medication Sig Dispense Refill  . allopurinol (ZYLOPRIM) 300 MG tablet Take 300 mg by mouth at bedtime.     Marland Kitchen aspirin 81 MG tablet Take 81 mg by mouth daily.    . cholecalciferol (VITAMIN D) 1000 UNITS tablet Take 1,000 Units by mouth at bedtime.     . cyanocobalamin (,VITAMIN B-12,) 1000 MCG/ML injection Inject 1,000 mcg into the muscle every 30 (thirty) days. 2nd Wednesday of the month.    . fexofenadine (ALLEGRA) 180 MG tablet Take 180 mg by mouth daily.    Marland Kitchen HYDROcodone-acetaminophen (NORCO/VICODIN) 5-325 MG tablet Take 1 tablet by mouth at bedtime.     Marland Kitchen losartan (COZAAR) 25 MG tablet Take 12.5 mg by mouth at bedtime. 1/2 TABLET (12.5MG ) DAILY     . metoprolol tartrate (LOPRESSOR) 25 MG tablet TAKE 1 TABLET BY MOUTH  TWICE DAILY (Patient taking differently: Take 25 mg by mouth 2 (two)  times daily. ) 180 tablet 3  . pravastatin (PRAVACHOL) 40 MG tablet Take 40 mg by mouth daily.    . tamsulosin (FLOMAX) 0.4 MG CAPS capsule Take 0.4 mg by mouth daily as needed (kidney stones).     . cyclobenzaprine (FLEXERIL) 5 MG tablet Take 5 mg by mouth 3 (three) times daily as needed. (Patient not taking: Reported on 01/16/2020)       Physical Exam: Left shoulder demonstrates painful and restricted motion as noted at his recent office visits.  He maintains good strength to manual muscle testing.  Plain radiographs confirm advanced osteoarthritis with complete loss of joint space, subchondral sclerosis, and peripheral osteophyte formation.  MRI scan confirms the rotator cuff to be intact.  Vitals  Temp:  [97.4 F (36.3 C)]  97.4 F (36.3 C) (10/28 0754) Pulse Rate:  [62-76] 62 (10/28 0959) Resp:  [14-28] 20 (10/28 0959) BP: (91-135)/(60-92) 97/68 (10/28 0944) SpO2:  [95 %-100 %] 99 % (10/28 0959)  Assessment/Plan  Impression: left shoulder osteoarthritis  Plan of Action: Procedure(s): TOTAL SHOULDER ARTHROPLASTY  Frederick Keller 01/30/2020, 10:15 AM Contact # [phone]

## 2020-01-30 NOTE — Anesthesia Postprocedure Evaluation (Signed)
Anesthesia Post Note  Patient: Frederick Keller  Procedure(s) Performed: TOTAL SHOULDER ARTHROPLASTY (Left Shoulder)     Patient location during evaluation: PACU Anesthesia Type: General and Regional Level of consciousness: awake and alert Pain management: pain level controlled Vital Signs Assessment: post-procedure vital signs reviewed and stable Respiratory status: spontaneous breathing, nonlabored ventilation, respiratory function stable and patient connected to nasal cannula oxygen Cardiovascular status: blood pressure returned to baseline and stable Postop Assessment: no apparent nausea or vomiting Anesthetic complications: no   No complications documented.  Last Vitals:  Vitals:   01/30/20 1430 01/30/20 1435  BP: 99/73 111/75  Pulse: 72   Resp: 15   Temp:    SpO2: 92% 93%    Last Pain:  Vitals:   01/30/20 1435  TempSrc:   PainSc: 0-No pain                 Barnet Glasgow

## 2020-01-31 ENCOUNTER — Encounter (HOSPITAL_COMMUNITY): Payer: Self-pay | Admitting: Orthopedic Surgery

## 2020-04-14 DIAGNOSIS — M47816 Spondylosis without myelopathy or radiculopathy, lumbar region: Secondary | ICD-10-CM | POA: Diagnosis not present

## 2020-04-18 DIAGNOSIS — J019 Acute sinusitis, unspecified: Secondary | ICD-10-CM | POA: Diagnosis not present

## 2020-04-18 DIAGNOSIS — R0981 Nasal congestion: Secondary | ICD-10-CM | POA: Diagnosis not present

## 2020-04-18 DIAGNOSIS — R059 Cough, unspecified: Secondary | ICD-10-CM | POA: Diagnosis not present

## 2020-04-27 DIAGNOSIS — Z96612 Presence of left artificial shoulder joint: Secondary | ICD-10-CM | POA: Diagnosis not present

## 2020-05-05 DIAGNOSIS — M5136 Other intervertebral disc degeneration, lumbar region: Secondary | ICD-10-CM | POA: Diagnosis not present

## 2020-06-08 DIAGNOSIS — I700 Atherosclerosis of aorta: Secondary | ICD-10-CM | POA: Diagnosis not present

## 2020-06-08 DIAGNOSIS — M545 Low back pain, unspecified: Secondary | ICD-10-CM | POA: Diagnosis not present

## 2020-06-08 DIAGNOSIS — N200 Calculus of kidney: Secondary | ICD-10-CM | POA: Diagnosis not present

## 2020-06-08 DIAGNOSIS — Z87442 Personal history of urinary calculi: Secondary | ICD-10-CM | POA: Diagnosis not present

## 2020-06-08 DIAGNOSIS — K429 Umbilical hernia without obstruction or gangrene: Secondary | ICD-10-CM | POA: Diagnosis not present

## 2020-06-08 DIAGNOSIS — K8020 Calculus of gallbladder without cholecystitis without obstruction: Secondary | ICD-10-CM | POA: Diagnosis not present

## 2020-06-08 DIAGNOSIS — R1084 Generalized abdominal pain: Secondary | ICD-10-CM | POA: Diagnosis not present

## 2020-06-08 DIAGNOSIS — N2889 Other specified disorders of kidney and ureter: Secondary | ICD-10-CM | POA: Diagnosis not present

## 2020-06-08 DIAGNOSIS — N281 Cyst of kidney, acquired: Secondary | ICD-10-CM | POA: Diagnosis not present

## 2020-06-19 DIAGNOSIS — I110 Hypertensive heart disease with heart failure: Secondary | ICD-10-CM | POA: Diagnosis not present

## 2020-06-19 DIAGNOSIS — E1122 Type 2 diabetes mellitus with diabetic chronic kidney disease: Secondary | ICD-10-CM | POA: Diagnosis not present

## 2020-06-19 DIAGNOSIS — I10 Essential (primary) hypertension: Secondary | ICD-10-CM | POA: Diagnosis not present

## 2020-06-19 DIAGNOSIS — M179 Osteoarthritis of knee, unspecified: Secondary | ICD-10-CM | POA: Diagnosis not present

## 2020-06-19 DIAGNOSIS — I5020 Unspecified systolic (congestive) heart failure: Secondary | ICD-10-CM | POA: Diagnosis not present

## 2020-06-19 DIAGNOSIS — N182 Chronic kidney disease, stage 2 (mild): Secondary | ICD-10-CM | POA: Diagnosis not present

## 2020-06-19 DIAGNOSIS — E7800 Pure hypercholesterolemia, unspecified: Secondary | ICD-10-CM | POA: Diagnosis not present

## 2020-06-25 ENCOUNTER — Ambulatory Visit: Payer: Medicare Other | Admitting: Podiatry

## 2020-06-25 ENCOUNTER — Encounter: Payer: Self-pay | Admitting: Podiatry

## 2020-06-25 ENCOUNTER — Ambulatory Visit (INDEPENDENT_AMBULATORY_CARE_PROVIDER_SITE_OTHER): Payer: Medicare Other

## 2020-06-25 ENCOUNTER — Other Ambulatory Visit: Payer: Self-pay

## 2020-06-25 DIAGNOSIS — M7661 Achilles tendinitis, right leg: Secondary | ICD-10-CM

## 2020-06-25 DIAGNOSIS — M79672 Pain in left foot: Secondary | ICD-10-CM | POA: Diagnosis not present

## 2020-06-25 DIAGNOSIS — M79671 Pain in right foot: Secondary | ICD-10-CM

## 2020-06-25 MED ORDER — TRIAMCINOLONE ACETONIDE 10 MG/ML IJ SUSP
10.0000 mg | Freq: Once | INTRAMUSCULAR | Status: AC
  Administered 2020-06-25: 10 mg

## 2020-06-28 NOTE — Progress Notes (Signed)
Subjective:   Patient ID: Frederick Keller, male   DOB: 65 y.o.   MRN: [id number]   HPI Patient presents stating he is having a lot of pain in the back of his right heel and he just started recently.  States he gets pain in both of his feet but the pain in the back of the right heel has become more intense recently.  Patient does not smoke likes to be active   Review of Systems  All other systems reviewed and are negative.       Objective:  Physical Exam Vitals and nursing note reviewed.  Constitutional:      Appearance: He is well-developed.  Pulmonary:     Effort: Pulmonary effort is normal.  Musculoskeletal:        General: Normal range of motion.  Skin:    General: Skin is warm.  Neurological:     Mental Status: He is alert.     Neurovascular status was found to be intact muscle strength found to be adequate range of motion adequate.  Patient is found to have posterior discomfort right heel at the Achilles tendon insertion into the calcaneus lateral side with no center medial involvement.  There is moderate swelling around the area and patient is found to have no equinus condition no muscle strength loss.  Has generalized foot pain that is noted bilateral and he does get discomfort at times in the bottom of his heels     Assessment:  Acute Achilles tendinitis right carotic plantar fasciitis low-grade bilateral      Plan:  H&P reviewed condition.  At this point I went ahead and for the right I did sterile prep and injected the fascial insertion 3 mg dexamethasone Kenalog after first explaining chances for rupture and I advised on ice therapy anti-inflammatories heel lift and support therapy for the plantar heels with instructions on stretching exercises  X-rays indicate posterior spur formation bilateral no indication stress fracture moderate plantar fascial inflammation with spur and depressed arch bilateral

## 2020-06-29 DIAGNOSIS — N200 Calculus of kidney: Secondary | ICD-10-CM | POA: Diagnosis not present

## 2020-06-29 DIAGNOSIS — N281 Cyst of kidney, acquired: Secondary | ICD-10-CM | POA: Diagnosis not present

## 2020-06-29 DIAGNOSIS — K8020 Calculus of gallbladder without cholecystitis without obstruction: Secondary | ICD-10-CM | POA: Diagnosis not present

## 2020-07-05 ENCOUNTER — Other Ambulatory Visit: Payer: Self-pay | Admitting: Internal Medicine

## 2020-07-05 DIAGNOSIS — I10 Essential (primary) hypertension: Secondary | ICD-10-CM

## 2020-07-05 DIAGNOSIS — I428 Other cardiomyopathies: Secondary | ICD-10-CM

## 2020-07-23 DIAGNOSIS — I110 Hypertensive heart disease with heart failure: Secondary | ICD-10-CM | POA: Diagnosis not present

## 2020-07-23 DIAGNOSIS — F5109 Other insomnia not due to a substance or known physiological condition: Secondary | ICD-10-CM | POA: Diagnosis not present

## 2020-07-23 DIAGNOSIS — E1122 Type 2 diabetes mellitus with diabetic chronic kidney disease: Secondary | ICD-10-CM | POA: Diagnosis not present

## 2020-07-23 DIAGNOSIS — N182 Chronic kidney disease, stage 2 (mild): Secondary | ICD-10-CM | POA: Diagnosis not present

## 2020-07-23 DIAGNOSIS — M109 Gout, unspecified: Secondary | ICD-10-CM | POA: Diagnosis not present

## 2020-08-06 ENCOUNTER — Ambulatory Visit: Payer: Medicare Other | Admitting: Podiatry

## 2020-08-06 ENCOUNTER — Other Ambulatory Visit: Payer: Self-pay

## 2020-08-06 DIAGNOSIS — I110 Hypertensive heart disease with heart failure: Secondary | ICD-10-CM | POA: Insufficient documentation

## 2020-08-06 DIAGNOSIS — N200 Calculus of kidney: Secondary | ICD-10-CM | POA: Insufficient documentation

## 2020-08-06 DIAGNOSIS — H9190 Unspecified hearing loss, unspecified ear: Secondary | ICD-10-CM | POA: Insufficient documentation

## 2020-08-06 DIAGNOSIS — R3129 Other microscopic hematuria: Secondary | ICD-10-CM | POA: Insufficient documentation

## 2020-08-06 DIAGNOSIS — M109 Gout, unspecified: Secondary | ICD-10-CM | POA: Insufficient documentation

## 2020-08-06 DIAGNOSIS — G2581 Restless legs syndrome: Secondary | ICD-10-CM | POA: Insufficient documentation

## 2020-08-06 DIAGNOSIS — M722 Plantar fascial fibromatosis: Secondary | ICD-10-CM | POA: Diagnosis not present

## 2020-08-06 DIAGNOSIS — R918 Other nonspecific abnormal finding of lung field: Secondary | ICD-10-CM | POA: Insufficient documentation

## 2020-08-06 DIAGNOSIS — E538 Deficiency of other specified B group vitamins: Secondary | ICD-10-CM | POA: Insufficient documentation

## 2020-08-06 DIAGNOSIS — R809 Proteinuria, unspecified: Secondary | ICD-10-CM | POA: Insufficient documentation

## 2020-08-06 DIAGNOSIS — J302 Other seasonal allergic rhinitis: Secondary | ICD-10-CM | POA: Insufficient documentation

## 2020-08-06 DIAGNOSIS — M179 Osteoarthritis of knee, unspecified: Secondary | ICD-10-CM | POA: Insufficient documentation

## 2020-08-06 DIAGNOSIS — M1710 Unilateral primary osteoarthritis, unspecified knee: Secondary | ICD-10-CM | POA: Insufficient documentation

## 2020-08-06 DIAGNOSIS — N182 Chronic kidney disease, stage 2 (mild): Secondary | ICD-10-CM | POA: Insufficient documentation

## 2020-08-06 DIAGNOSIS — F5102 Adjustment insomnia: Secondary | ICD-10-CM | POA: Insufficient documentation

## 2020-08-10 ENCOUNTER — Encounter: Payer: Self-pay | Admitting: Podiatry

## 2020-08-10 NOTE — Progress Notes (Signed)
  Subjective:  Patient ID: Frederick Keller, male    DOB: 06/02/1955,  MRN: [id number]  Chief Complaint  Patient presents with  . Foot Pain     diabetic with painful right foot    65 y.o. male presents with the above complaint. History confirmed with patient.  He previously saw Dr. Paulla Dolly for this several weeks ago and he did an injection which helped the majority of the pain and has element of the pain in the left foot.  The right foot is still very painful in the center of the heel.  Objective:  Physical Exam: warm, good capillary refill, no trophic changes or ulcerative lesions, normal DP and PT pulses, normal monofilament exam and normal sensory exam. Left Foot: Sharp pain on palpation to the medial plantar calcaneal tubercle Right Foot: No pain on palpation of the heel  Assessment:   1. Plantar fasciitis, right      Plan:  Patient was evaluated and treated and all questions answered.  Discussed the etiology and treatment options for plantar fasciitis including stretching, formal physical therapy, supportive shoegears such as a running shoe or sneaker, pre fabricated orthoses, injection therapy, and oral medications. We also discussed the role of surgical treatment of this for patients who do not improve after exhausting non-surgical treatment options.    -XR reviewed with patient -Educated patient on stretching and icing of the affected limb -Injection delivered to the plantar fascia of the right foot.  -Power steps dispensed -Return as needed  After sterile prep with povidone-iodine solution and alcohol, the right heel was injected with 0.5cc 2% xylocaine plain, 0.5cc 0.5% marcaine plain, 5mg  triamcinolone acetonide, and 2mg  dexamethasone was injected along  the plantar fascia at the insertion on the plantar calcaneus. The patient tolerated the procedure well without complication.    No follow-ups on file.

## 2020-08-12 ENCOUNTER — Ambulatory Visit: Payer: Medicare Other | Admitting: Podiatry

## 2020-08-15 DIAGNOSIS — S39012A Strain of muscle, fascia and tendon of lower back, initial encounter: Secondary | ICD-10-CM | POA: Diagnosis not present

## 2020-09-09 DIAGNOSIS — M519 Unspecified thoracic, thoracolumbar and lumbosacral intervertebral disc disorder: Secondary | ICD-10-CM | POA: Diagnosis not present

## 2020-09-09 DIAGNOSIS — M5136 Other intervertebral disc degeneration, lumbar region: Secondary | ICD-10-CM | POA: Diagnosis not present

## 2020-09-16 DIAGNOSIS — M179 Osteoarthritis of knee, unspecified: Secondary | ICD-10-CM | POA: Diagnosis not present

## 2020-09-16 DIAGNOSIS — E7800 Pure hypercholesterolemia, unspecified: Secondary | ICD-10-CM | POA: Diagnosis not present

## 2020-09-16 DIAGNOSIS — I110 Hypertensive heart disease with heart failure: Secondary | ICD-10-CM | POA: Diagnosis not present

## 2020-09-16 DIAGNOSIS — N182 Chronic kidney disease, stage 2 (mild): Secondary | ICD-10-CM | POA: Diagnosis not present

## 2020-09-16 DIAGNOSIS — I10 Essential (primary) hypertension: Secondary | ICD-10-CM | POA: Diagnosis not present

## 2020-09-16 DIAGNOSIS — I5020 Unspecified systolic (congestive) heart failure: Secondary | ICD-10-CM | POA: Diagnosis not present

## 2020-09-16 DIAGNOSIS — E1122 Type 2 diabetes mellitus with diabetic chronic kidney disease: Secondary | ICD-10-CM | POA: Diagnosis not present

## 2021-01-01 DIAGNOSIS — E7800 Pure hypercholesterolemia, unspecified: Secondary | ICD-10-CM | POA: Diagnosis not present

## 2021-01-01 DIAGNOSIS — M179 Osteoarthritis of knee, unspecified: Secondary | ICD-10-CM | POA: Diagnosis not present

## 2021-01-01 DIAGNOSIS — I10 Essential (primary) hypertension: Secondary | ICD-10-CM | POA: Diagnosis not present

## 2021-01-01 DIAGNOSIS — N182 Chronic kidney disease, stage 2 (mild): Secondary | ICD-10-CM | POA: Diagnosis not present

## 2021-01-01 DIAGNOSIS — I110 Hypertensive heart disease with heart failure: Secondary | ICD-10-CM | POA: Diagnosis not present

## 2021-01-01 DIAGNOSIS — E1122 Type 2 diabetes mellitus with diabetic chronic kidney disease: Secondary | ICD-10-CM | POA: Diagnosis not present

## 2021-01-01 DIAGNOSIS — I5020 Unspecified systolic (congestive) heart failure: Secondary | ICD-10-CM | POA: Diagnosis not present

## 2021-01-19 DIAGNOSIS — Z23 Encounter for immunization: Secondary | ICD-10-CM | POA: Diagnosis not present

## 2021-01-19 DIAGNOSIS — Z1389 Encounter for screening for other disorder: Secondary | ICD-10-CM | POA: Diagnosis not present

## 2021-01-19 DIAGNOSIS — I5020 Unspecified systolic (congestive) heart failure: Secondary | ICD-10-CM | POA: Diagnosis not present

## 2021-01-19 DIAGNOSIS — E538 Deficiency of other specified B group vitamins: Secondary | ICD-10-CM | POA: Diagnosis not present

## 2021-01-19 DIAGNOSIS — N182 Chronic kidney disease, stage 2 (mild): Secondary | ICD-10-CM | POA: Diagnosis not present

## 2021-01-19 DIAGNOSIS — E7800 Pure hypercholesterolemia, unspecified: Secondary | ICD-10-CM | POA: Diagnosis not present

## 2021-01-19 DIAGNOSIS — M109 Gout, unspecified: Secondary | ICD-10-CM | POA: Diagnosis not present

## 2021-01-19 DIAGNOSIS — Z0000 Encounter for general adult medical examination without abnormal findings: Secondary | ICD-10-CM | POA: Diagnosis not present

## 2021-01-19 DIAGNOSIS — E1122 Type 2 diabetes mellitus with diabetic chronic kidney disease: Secondary | ICD-10-CM | POA: Diagnosis not present

## 2021-01-19 DIAGNOSIS — G2581 Restless legs syndrome: Secondary | ICD-10-CM | POA: Diagnosis not present

## 2021-01-21 ENCOUNTER — Ambulatory Visit: Payer: Medicare Other | Admitting: Podiatry

## 2021-01-21 ENCOUNTER — Encounter: Payer: Self-pay | Admitting: Podiatry

## 2021-01-21 ENCOUNTER — Other Ambulatory Visit: Payer: Self-pay

## 2021-01-21 ENCOUNTER — Ambulatory Visit (INDEPENDENT_AMBULATORY_CARE_PROVIDER_SITE_OTHER): Payer: Medicare Other

## 2021-01-21 DIAGNOSIS — M7661 Achilles tendinitis, right leg: Secondary | ICD-10-CM | POA: Diagnosis not present

## 2021-01-21 DIAGNOSIS — M722 Plantar fascial fibromatosis: Secondary | ICD-10-CM

## 2021-01-21 MED ORDER — TRIAMCINOLONE ACETONIDE 10 MG/ML IJ SUSP
10.0000 mg | Freq: Once | INTRAMUSCULAR | Status: AC
  Administered 2021-01-21: 10 mg

## 2021-01-21 NOTE — Progress Notes (Signed)
Subjective:   Patient ID: Frederick Keller, male   DOB: 65 y.o.   MRN: [id number]   HPI Patient presents stating he has a lot of pain in his right arch of the last few weeks and states the back of the heels not hurting but the bottom has become very tender with walking and pressure   ROS      Objective:  Physical Exam  Neurovascular status intact with exquisite discomfort plantar aspect right arch distal to the insertion to the calcaneus with fluid buildup     Assessment:  Acute plantar fasciitis right     Plan:  Sterile prep injected the plantar fascial right 3 mg Kenalog 5 mg Xylocaine applied fascial brace to lift up the arch along with aggressive ice rigid bottom shoes and reappoint to recheck  X-rays indicate quite a bit of a Keller like spur plantar aspect right heel

## 2021-01-24 NOTE — Progress Notes (Addendum)
Cardiology Clinic Note   Patient Name: Frederick Keller Date of Encounter: 01/27/2021  Primary Care Provider:  Lavone Orn, MD Primary Cardiologist:  Pixie Casino, MD  Patient Profile    Frederick Keller 65 year old male presents the clinic today for follow-up evaluation of his paroxysmal SVT n and hypertension  Past Medical History    Past Medical History:  Diagnosis Date   Arthritis    CHF (congestive heart failure) ([place])    Diabetes mellitus    type 2-diet controlled   Essential hypertension 05/09/2016   Gout    High cholesterol    History of kidney stones    Nodule of right lung    lower-being monitored  and kidney for 5 year    Past Surgical History:  Procedure Laterality Date   ANKLE SURGERY Right 1970   JOINT REPLACEMENT     rt knee-2008   KNEE ARTHROSCOPY Bilateral    x2 right, x1 left   LEFT HEART CATHETERIZATION WITH CORONARY ANGIOGRAM N/A 06/30/2014   Procedure: LEFT HEART CATHETERIZATION WITH CORONARY ANGIOGRAM;  Surgeon: Troy Sine, MD;  Location: [facility] CATH LAB;  Service: Cardiovascular;  Laterality: N/A;   LITHOTRIPSY     NASAL SEPTUM SURGERY  1997   NASAL SINUS SURGERY  1972   reconstruction with tonsils   NOSE SURGERY  1960   from horse accident   TONSILLECTOMY  age 20   TOTAL KNEE ARTHROPLASTY Left 02/25/2014   Procedure: LEFT TOTAL KNEE ARTHROPLASTY;  Surgeon: Mauri Pole, MD;  Location: [facility] ORS;  Service: Orthopedics;  Laterality: Left;   TOTAL SHOULDER ARTHROPLASTY Left 01/30/2020   Procedure: TOTAL SHOULDER ARTHROPLASTY;  Surgeon: Justice Britain, MD;  Location: [facility];  Service: Orthopedics;  Laterality: Left;  176min    Allergies  Allergies  Allergen Reactions   Dilaudid [Hydromorphone Hcl] Nausea And Vomiting   Hydromorphone Nausea Only   Penicillins Hives   Atorvastatin Other (See Comments)   Lisinopril Cough    History of Present Illness    CANDIDO FLOTT has a PMH of paroxysmal SVT, nonischemic cardiomyopathy, essential  hypertension, HTN, diabetes mellitus type 2, CKD stage II, moderate obesity, Puzio due to, vitamin B12 deficiency, and restless leg syndrome.  He previously presented to the hospital with chest pain and an echocardiogram found his EF to be 25-30%.  He underwent cardiac catheterization which showed an EF of 45% and patent coronary arteries.  He was noted to have an episode of SVT with a heart rate of 170 during his hospitalization.  The patient was not aware of any other episodes.  It was felt that his SVT may be contributing to his nonischemic cardiomyopathy.  He was placed on metoprolol and his lisinopril was continued.  On follow-up he denied chest pain and shortness of breath.  He developed a dry nagging cough.  It was felt that this may be related to his lisinopril 2.5 mg that he was taking twice daily.  A repeat echo showed an EF of 50-55%.  He was encouraged to continue with his heart failure medications.  Subsequently he was switched from lisinopril to an ARB.  He reported that he felt like his cough became worse.  Dr. Debara Pickett felt that there may be other reasons for his cough.  He eventually stopped the medication and his cough went away.  His lisinopril was restarted.  He denied side effects from the medication.  He presented for follow-up on 01/14/2020.  During that time he continued  to do well.  He was highly active 8/21 and developed some acute inflammatory arthritis of his left shoulder.  It was felt that he may need a shoulder replacement.  He was scheduled to see orthopedics for further evaluation and treatment.  He presented to the cardiology clinic for preoperative cardiac evaluation.  During that time he denied shortness of breath and was able to walk up 2 flights of stairs without stopping.  He denied exertional chest discomfort.  His echocardiogram in 2016 showed normalized LV function and he was felt to be NYHA class I.  He presents the clinic today for follow-up evaluation and states he  feels well today.  He has recently purchased his father's tractor and is restoring it.  He has the tractor running and now plans to have it repeated.  It is a international.  We discussed his echocardiogram and previous angiography.  He reports that his father died of an MI and he believes in regular follow-up.  He tolerated his left shoulder surgery well and has regained full mobility.  He does notice that he has not regained full strength but is happy with his outcome.  I will repeat his lab work, given salty 6 diet sheet, have him maintain his physical activity, and plan follow-up in 12 months.  Today he denies chest pain, shortness of breath, lower extremity edema, fatigue, palpitations, melena, hematuria, hemoptysis, diaphoresis, weakness, presyncope, syncope, orthopnea, and PND.     Home Medications    Prior to Admission medications   Medication Sig Start Date End Date Taking? Authorizing Provider  allopurinol (ZYLOPRIM) 300 MG tablet Take 300 mg by mouth at bedtime.     [provider]  aspirin 81 MG tablet Take 81 mg by mouth daily.    [provider]  cholecalciferol (VITAMIN D) 1000 UNITS tablet Take 1,000 Units by mouth at bedtime.     [provider]  cyanocobalamin (,VITAMIN B-12,) 1000 MCG/ML injection Inject 1,000 mcg into the muscle every 30 (thirty) days. 2nd Wednesday of the month.    [provider]  cyclobenzaprine (FLEXERIL) 5 MG tablet 1 tablet 06/15/19   [provider]  fexofenadine (ALLEGRA) 180 MG tablet Take 180 mg by mouth daily.    [provider]  indomethacin (INDOCIN) 50 MG capsule 1 capsule with food or milk 01/15/20   [provider]  losartan (COZAAR) 25 MG tablet Take 12.5 mg by mouth at bedtime. 1/2 TABLET (12.5MG ) DAILY     [provider]  metoprolol tartrate (LOPRESSOR) 25 MG tablet TAKE 1 TABLET BY MOUTH  TWICE DAILY 07/07/20   Hilty, Nadean Corwin, MD  pravastatin (PRAVACHOL) 40 MG tablet  Take 40 mg by mouth daily.    [provider]  predniSONE (DELTASONE) 10 MG tablet Take by mouth. 05/06/20   [provider]  tamsulosin (FLOMAX) 0.4 MG CAPS capsule Take 0.4 mg by mouth daily as needed (kidney stones).     [provider]    Family History    Family History  Problem Relation Age of Onset   CAD Mother    Heart disease Mother    Dementia Mother    CAD Father    Heart attack Father    Healthy Sister    Healthy Brother    Healthy Sister    Diabetes Brother    Kidney cancer Brother    Healthy Sister    He indicated that his mother is alive. He indicated that his father is deceased.  He indicated that all of his three sisters are alive. He indicated that both of his brothers are alive.  Social History    Social History   Socioeconomic History   Marital status: Married    Spouse name: Not on file   Number of children: Not on file   Years of education: Not on file   Highest education level: Not on file  Occupational History   Not on file  Tobacco Use   Smoking status: Former    Years: 30.00    Types: Cigarettes    Quit date: 04/04/1996    Years since quitting: 24.8   Smokeless tobacco: Never  Vaping Use   Vaping Use: Never used  Substance and Sexual Activity   Alcohol use: No   Drug use: No   Sexual activity: Not Currently  Other Topics Concern   Not on file  Social History Narrative   Not on file   Social Determinants of Health   Financial Resource Strain: Not on file  Food Insecurity: Not on file  Transportation Needs: Not on file  Physical Activity: Not on file  Stress: Not on file  Social Connections: Not on file  Intimate Partner Violence: Not on file     Review of Systems    General:  No chills, fever, night sweats or weight changes.  Cardiovascular:  No chest pain, dyspnea on exertion, edema, orthopnea, palpitations, paroxysmal nocturnal dyspnea. Dermatological: No rash, lesions/masses Respiratory: No cough,  dyspnea Urologic: No hematuria, dysuria Abdominal:   No nausea, vomiting, diarrhea, bright red blood per rectum, melena, or hematemesis Neurologic:  No visual changes, wkns, changes in mental status. All other systems reviewed and are otherwise negative except as noted above.  Physical Exam    VS:  BP 118/66   Pulse 68   Ht 5\' 10"  (1.778 m)   Wt 231 lb 6.4 oz (105 kg)   SpO2 97%   BMI 33.20 kg/m  , BMI Body mass index is 33.2 kg/m. GEN: Well nourished, well developed, in no acute distress. HEENT: normal. Neck: Supple, no JVD, carotid bruits, or masses. Cardiac: RRR, no murmurs, rubs, or gallops. No clubbing, cyanosis, edema.  Radials/DP/PT 2+ and equal bilaterally.  Respiratory:  Respirations regular and unlabored, clear to auscultation bilaterally. GI: Soft, nontender, nondistended, BS + x 4. MS: no deformity or atrophy. Skin: warm and dry, no rash. Neuro:  Strength and sensation are intact. Psych: Normal affect.  Accessory Clinical Findings    Recent Labs: No results found for requested labs within last 8760 hours.   Recent Lipid Panel    Component Value Date/Time   CHOL 168 08/06/2019 1435   TRIG 139 08/06/2019 1435   HDL 38 (L) 08/06/2019 1435   CHOLHDL 4.4 08/06/2019 1435   CHOLHDL 4.5 06/29/2014 0649   VLDL 27 06/29/2014 0649   LDLCALC 105 (H) 08/06/2019 1435    ECG personally reviewed by me today-normal sinus rhythm nonspecific T wave abnormality prolonged QT 68 bpm- No acute changes  Echocardiogram 09/30/2014  Study Conclusions   - Left ventricle: The cavity size was normal. Wall thickness was    normal. Systolic function was normal. The estimated ejection    fraction was in the range of 50% to 55%. Doppler parameters are    consistent with abnormal left ventricular relaxation (grade 1    diastolic dysfunction). The E/e&' ratio is <8, suggesting normal    LV filling pressure.  - Left atrium: The atrium was normal in size.  Impressions:   - Compared  to the prior echo in 06/2014, the EF has improved to    50-55%.  Assessment & Plan   1.  Nonischemic cardiomyopathy-denies increased DOE or activity intolerance.  Remains very physically active.  Echocardiogram 2016 showed EF 50-55% with G1 DD.  Continues to be NYHA class I. Continue losartan, metoprolol Heart healthy low-sodium diet-salty 6 given Increase physical activity as tolerated BMP, CBC  Dyslipidemia-LDL 101 on 01/19/21 Continue aspirin, pravastatin Heart healthy low-sodium diet-salty 6 given Increase physical activity as tolerated Repeat LFTs Follows with PCP  Obesity-weight today 231.6 pounds.   Heart healthy low-sodium diet-salty 6 given Increase physical activity as tolerated Continue weight loss  Disposition: Follow-up with Dr. Debara Pickett in 12 months.  Jossie Ng. Kalayla Shadden NP-C    01/27/2021, 8:25 AM [address] Office [phone] Fax [phone]  Notice: This dictation was prepared with Dragon dictation along with smaller phrase technology. Any transcriptional errors that result from this process are unintentional and may not be corrected upon review.  I spent 14 minutes examining this patient, reviewing medications, and using patient centered shared decision making involving her cardiac care.  Prior to her visit I spent greater than 20 minutes reviewing her past medical history,  medications, and prior cardiac tests.

## 2021-01-27 ENCOUNTER — Other Ambulatory Visit: Payer: Self-pay

## 2021-01-27 ENCOUNTER — Encounter: Payer: Self-pay | Admitting: General Practice

## 2021-01-27 ENCOUNTER — Ambulatory Visit: Payer: Medicare Other | Admitting: General Practice

## 2021-01-27 VITALS — BP 118/66 | HR 68 | Ht 70.0 in | Wt 231.4 lb

## 2021-01-27 DIAGNOSIS — E782 Mixed hyperlipidemia: Secondary | ICD-10-CM

## 2021-01-27 DIAGNOSIS — Z79899 Other long term (current) drug therapy: Secondary | ICD-10-CM | POA: Diagnosis not present

## 2021-01-27 DIAGNOSIS — E668 Other obesity: Secondary | ICD-10-CM | POA: Diagnosis not present

## 2021-01-27 DIAGNOSIS — I428 Other cardiomyopathies: Secondary | ICD-10-CM

## 2021-01-27 LAB — BASIC METABOLIC PANEL
BUN/Creatinine Ratio: 13 (ref 10–24)
BUN: 14 mg/dL (ref 8–27)
CO2: 27 mmol/L (ref 20–29)
Calcium: 9.6 mg/dL (ref 8.6–10.2)
Chloride: 100 mmol/L (ref 96–106)
Creatinine, Ser: 1.09 mg/dL (ref 0.76–1.27)
Glucose: 130 mg/dL — ABNORMAL HIGH (ref 70–99)
Potassium: 5.4 mmol/L — ABNORMAL HIGH (ref 3.5–5.2)
Sodium: 141 mmol/L (ref 134–144)
eGFR: 75 mL/min/{1.73_m2} (ref >59)

## 2021-01-27 LAB — CBC
Hematocrit: 46.4 % (ref 37.5–51.0)
Hemoglobin: 15.5 g/dL (ref 13.0–17.7)
MCH: 32.6 pg (ref 26.6–33.0)
MCHC: 33.4 g/dL (ref 31.5–35.7)
MCV: 98 fL — ABNORMAL HIGH (ref 79–97)
Platelets: 174 10*3/uL (ref 150–450)
RBC: 4.76 x10E6/uL (ref 4.14–5.80)
RDW: 12.7 % (ref 11.6–15.4)
WBC: 8.8 10*3/uL (ref 3.4–10.8)

## 2021-01-27 LAB — HEPATIC FUNCTION PANEL
ALT: 19 IU/L (ref 0–44)
AST: 24 IU/L (ref 0–40)
Albumin: 4.3 g/dL (ref 3.8–4.8)
Alkaline Phosphatase: 109 IU/L (ref 44–121)
Bilirubin Total: 0.5 mg/dL (ref 0.0–1.2)
Bilirubin, Direct: 0.15 mg/dL (ref 0.00–0.40)
Total Protein: 6.9 g/dL (ref 6.0–8.5)

## 2021-01-27 NOTE — Addendum Note (Signed)
Addended by: Waylan Rocher on: 01/27/2021 08:32 AM   Modules accepted: Orders

## 2021-01-27 NOTE — Patient Instructions (Signed)
Medication Instructions:  The current medical regimen is effective;  continue present plan and medications as directed. Please refer to the Current Medication list given to you today. *If you need a refill on your cardiac medications before your next appointment, please call your pharmacy*  Lab Work: BMET,CBC,LFT AND LIPID TODAY If you have labs (blood work) drawn today and your tests are completely normal, you will receive your results only by:  [facility] (if you have MyChart) OR A paper copy in the mail.  If you have any lab test that is abnormal or we need to change your treatment, we will call you to review the results. You may go to any [facility] that is convenient for you however, we do have a lab in our office that is able to assist you. You DO NOT need an appointment for our lab. The lab is open 8:00am and closes at 4:00pm. Lunch 12:45 - 1:45pm.  Special Instructions  PLEASE READ AND FOLLOW SALTY 6-ATTACHED-1,800 mg daily  PLEASE INCREASE PHYSICAL ACTIVITY AS TOLERATED  Follow-Up: Your next appointment:  12 month(s) In Person with You may see Pixie Casino, MD, IF UNAVAILABLE JESSE CLEAVER, FNP-C  or one of the following Advanced Practice Providers on your designated Care Team:  Almyra Deforest, PA-C, Fabian Sharp, [place] or Roby Lofts, [place]   Please call our office 2 months in advance to schedule this appointment   At [facility], you and your health needs are our priority.  As part of our continuing mission to provide you with exceptional heart care, we have created designated Provider Care Teams.  These Care Teams include your primary Cardiologist (physician) and Advanced Practice Providers (APPs -  Physician Assistants and Nurse Practitioners) who all work together to provide you with the care you need, when you need it.

## 2021-01-29 ENCOUNTER — Other Ambulatory Visit: Payer: Self-pay

## 2021-01-29 DIAGNOSIS — E782 Mixed hyperlipidemia: Secondary | ICD-10-CM

## 2021-01-29 DIAGNOSIS — Z79899 Other long term (current) drug therapy: Secondary | ICD-10-CM

## 2021-01-29 DIAGNOSIS — I428 Other cardiomyopathies: Secondary | ICD-10-CM

## 2021-01-29 DIAGNOSIS — E668 Other obesity: Secondary | ICD-10-CM

## 2021-02-09 ENCOUNTER — Other Ambulatory Visit: Payer: Self-pay

## 2021-02-09 DIAGNOSIS — Z79899 Other long term (current) drug therapy: Secondary | ICD-10-CM | POA: Diagnosis not present

## 2021-02-09 DIAGNOSIS — E782 Mixed hyperlipidemia: Secondary | ICD-10-CM

## 2021-02-09 DIAGNOSIS — E668 Other obesity: Secondary | ICD-10-CM

## 2021-02-09 DIAGNOSIS — I428 Other cardiomyopathies: Secondary | ICD-10-CM

## 2021-02-10 LAB — BASIC METABOLIC PANEL
BUN/Creatinine Ratio: 12 (ref 10–24)
BUN: 14 mg/dL (ref 8–27)
CO2: 27 mmol/L (ref 20–29)
Calcium: 9.6 mg/dL (ref 8.6–10.2)
Chloride: 100 mmol/L (ref 96–106)
Creatinine, Ser: 1.19 mg/dL (ref 0.76–1.27)
Glucose: 116 mg/dL — ABNORMAL HIGH (ref 70–99)
Potassium: 4.4 mmol/L (ref 3.5–5.2)
Sodium: 142 mmol/L (ref 134–144)
eGFR: 68 mL/min/{1.73_m2} (ref >59)

## 2021-02-19 DIAGNOSIS — H57813 Brow ptosis, bilateral: Secondary | ICD-10-CM | POA: Diagnosis not present

## 2021-02-19 DIAGNOSIS — H524 Presbyopia: Secondary | ICD-10-CM | POA: Diagnosis not present

## 2021-02-19 DIAGNOSIS — H52222 Regular astigmatism, left eye: Secondary | ICD-10-CM | POA: Diagnosis not present

## 2021-02-19 DIAGNOSIS — E119 Type 2 diabetes mellitus without complications: Secondary | ICD-10-CM | POA: Diagnosis not present

## 2021-02-19 DIAGNOSIS — H2513 Age-related nuclear cataract, bilateral: Secondary | ICD-10-CM | POA: Diagnosis not present

## 2021-03-08 DIAGNOSIS — U071 COVID-19: Secondary | ICD-10-CM | POA: Diagnosis not present

## 2021-03-08 DIAGNOSIS — R051 Acute cough: Secondary | ICD-10-CM | POA: Diagnosis not present

## 2021-03-10 DIAGNOSIS — E1122 Type 2 diabetes mellitus with diabetic chronic kidney disease: Secondary | ICD-10-CM | POA: Diagnosis not present

## 2021-03-10 DIAGNOSIS — I5020 Unspecified systolic (congestive) heart failure: Secondary | ICD-10-CM | POA: Diagnosis not present

## 2021-03-10 DIAGNOSIS — I10 Essential (primary) hypertension: Secondary | ICD-10-CM | POA: Diagnosis not present

## 2021-03-10 DIAGNOSIS — N182 Chronic kidney disease, stage 2 (mild): Secondary | ICD-10-CM | POA: Diagnosis not present

## 2021-03-10 DIAGNOSIS — I110 Hypertensive heart disease with heart failure: Secondary | ICD-10-CM | POA: Diagnosis not present

## 2021-03-10 DIAGNOSIS — M179 Osteoarthritis of knee, unspecified: Secondary | ICD-10-CM | POA: Diagnosis not present

## 2021-03-10 DIAGNOSIS — E7800 Pure hypercholesterolemia, unspecified: Secondary | ICD-10-CM | POA: Diagnosis not present

## 2021-03-17 ENCOUNTER — Other Ambulatory Visit: Payer: Self-pay | Admitting: Internal Medicine

## 2021-03-17 DIAGNOSIS — I10 Essential (primary) hypertension: Secondary | ICD-10-CM

## 2021-03-17 DIAGNOSIS — J0140 Acute pansinusitis, unspecified: Secondary | ICD-10-CM | POA: Diagnosis not present

## 2021-03-17 DIAGNOSIS — B349 Viral infection, unspecified: Secondary | ICD-10-CM | POA: Diagnosis not present

## 2021-03-17 DIAGNOSIS — I428 Other cardiomyopathies: Secondary | ICD-10-CM

## 2021-05-04 DIAGNOSIS — H02831 Dermatochalasis of right upper eyelid: Secondary | ICD-10-CM | POA: Diagnosis not present

## 2021-05-04 DIAGNOSIS — H02132 Senile ectropion of right lower eyelid: Secondary | ICD-10-CM | POA: Diagnosis not present

## 2021-05-04 DIAGNOSIS — H57813 Brow ptosis, bilateral: Secondary | ICD-10-CM | POA: Diagnosis not present

## 2021-05-04 DIAGNOSIS — H02834 Dermatochalasis of left upper eyelid: Secondary | ICD-10-CM | POA: Diagnosis not present

## 2021-05-06 ENCOUNTER — Other Ambulatory Visit: Payer: Self-pay

## 2021-05-06 ENCOUNTER — Encounter: Payer: Self-pay | Admitting: Podiatrist

## 2021-05-06 ENCOUNTER — Ambulatory Visit: Payer: Medicare Other | Admitting: Podiatrist

## 2021-05-06 DIAGNOSIS — M722 Plantar fascial fibromatosis: Secondary | ICD-10-CM | POA: Diagnosis not present

## 2021-05-06 MED ORDER — TRIAMCINOLONE ACETONIDE 10 MG/ML IJ SUSP: 10.0000 mg | Freq: Once | INTRAMUSCULAR | Status: DC

## 2021-05-06 NOTE — Progress Notes (Signed)
Chief Complaint  Patient presents with   Plantar Fasciitis    Follow up right heel   "It has flared up and got really bad since Saturday"     HPI: Patient is 66 y.o. male who presents today for a flare of heel pain over the past few days. He relates the pain has become worse and worse and icing, antiinflammatories and stretching are not helping.  He also relates a history of gout in his family and he has also had gout in the past.    Allergies  Allergen Reactions   Dilaudid [Hydromorphone Hcl] Nausea And Vomiting   Hydromorphone Nausea Only   Penicillins Hives   Atorvastatin Other (See Comments)   Lisinopril Cough    Review of systems is negative except as noted in the HPI.  Denies nausea/ vomiting/ fevers/ chills or night sweats.   Denies difficulty breathing, denies calf pain or tenderness  Physical Exam  Patient is awake, alert, and oriented x 3.  In no acute distress.    Vascular status is intact with palpable pedal pulses DP and PT bilateral and capillary refill time less than 3 seconds bilateral.  No edema or erythema noted.   Neurological exam reveals epicritic and protective sensation grossly intact bilateral.   Dermatological exam reveals skin is supple and dry to bilateral feet.  No open lesions present.    Musculoskeletal exam: Musculature intact with dorsiflexion, plantarflexion, inversion, eversion. Pain on palpation plantar medial heel at insertion of plantar fascia on medial calcaneal tubercle.  [place] tenderness is noted on exam.    Assessment:   ICD-10-CM   1. Plantar fasciitis, right  M72.2 triamcinolone acetonide (KENALOG) 10 MG/ML injection 10 mg       Plan: Discussed treatment options and at this time a plantar fascial injection was recommended.  The patient agreed and a sterile skin prep was applied.  An injection consisting of kenalog and marcaine mixture was infiltrated at the point of maximal tenderness on the right Heel.  The patient tolerated this  well and was given instructions for aftercare.   He has custom orthotic and will continue wearing these.

## 2021-05-06 NOTE — Patient Instructions (Signed)

## 2021-06-01 ENCOUNTER — Observation Stay (HOSPITAL_COMMUNITY)
Admission: EM | Admit: 2021-06-01 | Discharge: 2021-06-02 | Disposition: A | Discharge status: Home / Self Care | Payer: Medicare Other | Attending: Family Medicine | Admitting: Family Medicine

## 2021-06-01 ENCOUNTER — Other Ambulatory Visit: Payer: Self-pay

## 2021-06-01 ENCOUNTER — Emergency Department (HOSPITAL_COMMUNITY): Payer: Medicare Other

## 2021-06-01 DIAGNOSIS — Z7982 Long term (current) use of aspirin: Secondary | ICD-10-CM | POA: Insufficient documentation

## 2021-06-01 DIAGNOSIS — I5020 Unspecified systolic (congestive) heart failure: Secondary | ICD-10-CM | POA: Diagnosis not present

## 2021-06-01 DIAGNOSIS — R531 Weakness: Secondary | ICD-10-CM | POA: Diagnosis not present

## 2021-06-01 DIAGNOSIS — Z96612 Presence of left artificial shoulder joint: Secondary | ICD-10-CM | POA: Insufficient documentation

## 2021-06-01 DIAGNOSIS — I110 Hypertensive heart disease with heart failure: Secondary | ICD-10-CM | POA: Diagnosis not present

## 2021-06-01 DIAGNOSIS — R062 Wheezing: Secondary | ICD-10-CM | POA: Diagnosis not present

## 2021-06-01 DIAGNOSIS — Z87891 Personal history of nicotine dependence: Secondary | ICD-10-CM | POA: Insufficient documentation

## 2021-06-01 DIAGNOSIS — E1165 Type 2 diabetes mellitus with hyperglycemia: Secondary | ICD-10-CM | POA: Diagnosis not present

## 2021-06-01 DIAGNOSIS — R0902 Hypoxemia: Secondary | ICD-10-CM | POA: Insufficient documentation

## 2021-06-01 DIAGNOSIS — R079 Chest pain, unspecified: Secondary | ICD-10-CM | POA: Diagnosis not present

## 2021-06-01 DIAGNOSIS — R0602 Shortness of breath: Secondary | ICD-10-CM | POA: Diagnosis not present

## 2021-06-01 DIAGNOSIS — J40 Bronchitis, not specified as acute or chronic: Principal | ICD-10-CM | POA: Insufficient documentation

## 2021-06-01 DIAGNOSIS — Z20822 Contact with and (suspected) exposure to covid-19: Secondary | ICD-10-CM | POA: Insufficient documentation

## 2021-06-01 DIAGNOSIS — Z96652 Presence of left artificial knee joint: Secondary | ICD-10-CM | POA: Diagnosis not present

## 2021-06-01 DIAGNOSIS — Z79899 Other long term (current) drug therapy: Secondary | ICD-10-CM | POA: Diagnosis not present

## 2021-06-01 DIAGNOSIS — R051 Acute cough: Secondary | ICD-10-CM | POA: Diagnosis not present

## 2021-06-01 LAB — COMPREHENSIVE METABOLIC PANEL
ALT: 28 U/L (ref 0–44)
AST: 34 U/L (ref 15–41)
Albumin: 3.8 g/dL (ref 3.5–5.0)
Alkaline Phosphatase: 87 U/L (ref 38–126)
Anion gap: 9 (ref 5–15)
BUN: 19 mg/dL (ref 8–23)
CO2: 28 mmol/L (ref 22–32)
Calcium: 9.6 mg/dL (ref 8.9–10.3)
Chloride: 97 mmol/L — ABNORMAL LOW (ref 98–111)
Creatinine, Ser: 1.04 mg/dL (ref 0.61–1.24)
GFR, Estimated: >60 mL/min (ref >60)
Glucose, Bld: 187 mg/dL — ABNORMAL HIGH (ref 70–99)
Potassium: 4.3 mmol/L (ref 3.5–5.1)
Sodium: 134 mmol/L — ABNORMAL LOW (ref 135–145)
Total Bilirubin: 0.6 mg/dL (ref 0.3–1.2)
Total Protein: 7.4 g/dL (ref 6.5–8.1)

## 2021-06-01 LAB — CBC WITH DIFFERENTIAL/PLATELET
Abs Immature Granulocytes: 0.08 10*3/uL — ABNORMAL HIGH (ref 0.00–0.07)
Basophils Absolute: 0 10*3/uL (ref 0.0–0.1)
Basophils Relative: 0 %
Eosinophils Absolute: 0 10*3/uL (ref 0.0–0.5)
Eosinophils Relative: 0 %
HCT: 51.4 % (ref 39.0–52.0)
Hemoglobin: 16.7 g/dL (ref 13.0–17.0)
Immature Granulocytes: 1 %
Lymphocytes Relative: 8 %
Lymphs Abs: 0.8 10*3/uL (ref 0.7–4.0)
MCH: 32.2 pg (ref 26.0–34.0)
MCHC: 32.5 g/dL (ref 30.0–36.0)
MCV: 99.2 fL (ref 80.0–100.0)
Monocytes Absolute: 0.7 10*3/uL (ref 0.1–1.0)
Monocytes Relative: 7 %
Neutro Abs: 8.5 10*3/uL — ABNORMAL HIGH (ref 1.7–7.7)
Neutrophils Relative %: 84 %
Platelets: 149 10*3/uL — ABNORMAL LOW (ref 150–400)
RBC: 5.18 MIL/uL (ref 4.22–5.81)
RDW: 13.5 % (ref 11.5–15.5)
WBC: 10 10*3/uL (ref 4.0–10.5)
nRBC: 0 % (ref 0.0–0.2)

## 2021-06-01 LAB — BRAIN NATRIURETIC PEPTIDE: B Natriuretic Peptide: 73.9 pg/mL (ref 0.0–100.0)

## 2021-06-01 LAB — HIV ANTIBODY (ROUTINE TESTING W REFLEX): HIV Screen 4th Generation wRfx: NONREACTIVE

## 2021-06-01 LAB — URINALYSIS, ROUTINE W REFLEX MICROSCOPIC
Bilirubin Urine: NEGATIVE
Glucose, UA: NEGATIVE mg/dL
Ketones, ur: NEGATIVE mg/dL
Leukocytes,Ua: NEGATIVE
Nitrite: NEGATIVE
Protein, ur: 100 mg/dL — AB
Specific Gravity, Urine: 1.021 (ref 1.005–1.030)
pH: 5 (ref 5.0–8.0)

## 2021-06-01 LAB — TROPONIN I (HIGH SENSITIVITY)
Troponin I (High Sensitivity): 8 ng/L (ref <18)
Troponin I (High Sensitivity): 9 ng/L (ref <18)

## 2021-06-01 LAB — RESP PANEL BY RT-PCR (FLU A&B, COVID) ARPGX2
Influenza A by PCR: NEGATIVE
Influenza B by PCR: NEGATIVE
SARS Coronavirus 2 by RT PCR: NEGATIVE

## 2021-06-01 LAB — LACTIC ACID, PLASMA: Lactic Acid, Venous: 1.9 mmol/L (ref 0.5–1.9)

## 2021-06-01 LAB — I-STAT VENOUS BLOOD GAS, ED
Acid-Base Excess: 6 mmol/L — ABNORMAL HIGH (ref 0.0–2.0)
Bicarbonate: 32.2 mmol/L — ABNORMAL HIGH (ref 20.0–28.0)
Calcium, Ion: 1.18 mmol/L (ref 1.15–1.40)
HCT: 49 % (ref 39.0–52.0)
Hemoglobin: 16.7 g/dL (ref 13.0–17.0)
O2 Saturation: 77 %
Potassium: 5.1 mmol/L (ref 3.5–5.1)
Sodium: 133 mmol/L — ABNORMAL LOW (ref 135–145)
TCO2: 34 mmol/L — ABNORMAL HIGH (ref 22–32)
pCO2, Ven: 49.7 mmHg (ref 44–60)
pH, Ven: 7.419 (ref 7.25–7.43)
pO2, Ven: 42 mmHg (ref 32–45)

## 2021-06-01 LAB — HEMOGLOBIN A1C
Hgb A1c MFr Bld: 6.1 % — ABNORMAL HIGH (ref 4.8–5.6)
Mean Plasma Glucose: 128.37 mg/dL

## 2021-06-01 LAB — PROTIME-INR
INR: 1 (ref 0.8–1.2)
Prothrombin Time: 13 seconds (ref 11.4–15.2)

## 2021-06-01 MED ORDER — MOMETASONE FURO-FORMOTEROL FUM 200-5 MCG/ACT IN AERO
2.0000 | INHALATION_SPRAY | Freq: Two times a day (BID) | RESPIRATORY_TRACT | Status: DC
  Administered 2021-06-01 – 2021-06-02 (×2): 2 via RESPIRATORY_TRACT
  Filled 2021-06-01: qty 8.8

## 2021-06-01 MED ORDER — PANTOPRAZOLE SODIUM 20 MG PO TBEC
20.0000 mg | DELAYED_RELEASE_TABLET | Freq: Every day | ORAL | Status: DC
  Administered 2021-06-01 – 2021-06-02 (×2): 20 mg via ORAL
  Filled 2021-06-01 (×2): qty 1

## 2021-06-01 MED ORDER — ENOXAPARIN SODIUM 40 MG/0.4ML IJ SOSY
40.0000 mg | PREFILLED_SYRINGE | INTRAMUSCULAR | Status: DC
  Filled 2021-06-01: qty 0.4

## 2021-06-01 MED ORDER — ALLOPURINOL 300 MG PO TABS
300.0000 mg | ORAL_TABLET | Freq: Every day | ORAL | Status: DC
  Administered 2021-06-01: 300 mg via ORAL
  Filled 2021-06-01: qty 3

## 2021-06-01 MED ORDER — CYANOCOBALAMIN 1000 MCG/ML IJ SOLN: 1000.0000 ug | INTRAMUSCULAR | Status: DC

## 2021-06-01 MED ORDER — PRAVASTATIN SODIUM 40 MG PO TABS
40.0000 mg | ORAL_TABLET | Freq: Every day | ORAL | Status: DC
  Administered 2021-06-01 – 2021-06-02 (×2): 40 mg via ORAL
  Filled 2021-06-01 (×2): qty 1

## 2021-06-01 MED ORDER — METOPROLOL TARTRATE 25 MG PO TABS
25.0000 mg | ORAL_TABLET | Freq: Two times a day (BID) | ORAL | Status: DC
  Administered 2021-06-01 – 2021-06-02 (×2): 25 mg via ORAL
  Filled 2021-06-01 (×2): qty 1

## 2021-06-01 MED ORDER — PREDNISONE 20 MG PO TABS
40.0000 mg | ORAL_TABLET | Freq: Every day | ORAL | Status: DC
  Administered 2021-06-02: 40 mg via ORAL
  Filled 2021-06-01: qty 2

## 2021-06-01 MED ORDER — TAMSULOSIN HCL 0.4 MG PO CAPS: 0.4000 mg | ORAL_CAPSULE | Freq: Every day | ORAL | Status: DC | PRN

## 2021-06-01 MED ORDER — ASPIRIN EC 81 MG PO TBEC
81.0000 mg | DELAYED_RELEASE_TABLET | Freq: Every day | ORAL | Status: DC
  Administered 2021-06-01 – 2021-06-02 (×2): 81 mg via ORAL
  Filled 2021-06-01 (×2): qty 1

## 2021-06-01 MED ORDER — GUAIFENESIN ER 600 MG PO TB12
1200.0000 mg | ORAL_TABLET | Freq: Two times a day (BID) | ORAL | Status: DC
  Administered 2021-06-01 – 2021-06-02 (×2): 1200 mg via ORAL
  Filled 2021-06-01 (×2): qty 2

## 2021-06-01 MED ORDER — IPRATROPIUM-ALBUTEROL 0.5-2.5 (3) MG/3ML IN SOLN
3.0000 mL | Freq: Four times a day (QID) | RESPIRATORY_TRACT | Status: DC
  Administered 2021-06-01 – 2021-06-02 (×4): 3 mL via RESPIRATORY_TRACT
  Filled 2021-06-01 (×4): qty 3

## 2021-06-01 MED ORDER — PRAMIPEXOLE DIHYDROCHLORIDE 0.25 MG PO TABS
0.5000 mg | ORAL_TABLET | Freq: Every day | ORAL | Status: DC
  Filled 2021-06-01: qty 2

## 2021-06-01 MED ORDER — ALBUTEROL SULFATE (2.5 MG/3ML) 0.083% IN NEBU
2.5000 mg | INHALATION_SOLUTION | RESPIRATORY_TRACT | Status: DC | PRN
  Filled 2021-06-01: qty 3

## 2021-06-01 MED ORDER — BUDESONIDE 0.5 MG/2ML IN SUSP
2.0000 mg | Freq: Two times a day (BID) | RESPIRATORY_TRACT | Status: DC
  Administered 2021-06-01: 2 mg via RESPIRATORY_TRACT
  Filled 2021-06-01: qty 8

## 2021-06-01 MED ORDER — METHYLPREDNISOLONE SODIUM SUCC 125 MG IJ SOLR
125.0000 mg | Freq: Once | INTRAMUSCULAR | Status: AC
  Administered 2021-06-01: 125 mg via INTRAVENOUS
  Filled 2021-06-01: qty 2

## 2021-06-01 MED ORDER — IPRATROPIUM-ALBUTEROL 0.5-2.5 (3) MG/3ML IN SOLN
3.0000 mL | Freq: Once | RESPIRATORY_TRACT | Status: AC
  Administered 2021-06-01: 3 mL via RESPIRATORY_TRACT
  Filled 2021-06-01: qty 3

## 2021-06-01 MED ORDER — LEVOFLOXACIN 500 MG PO TABS
750.0000 mg | ORAL_TABLET | Freq: Every day | ORAL | Status: DC
  Administered 2021-06-01: 750 mg via ORAL
  Filled 2021-06-01: qty 1

## 2021-06-01 MED ORDER — VITAMIN D 25 MCG (1000 UNIT) PO TABS
1000.0000 [IU] | ORAL_TABLET | Freq: Every day | ORAL | Status: DC
  Administered 2021-06-01: 1000 [IU] via ORAL
  Filled 2021-06-01: qty 1

## 2021-06-01 MED ORDER — LOSARTAN POTASSIUM 25 MG PO TABS
12.5000 mg | ORAL_TABLET | Freq: Every day | ORAL | Status: DC
  Administered 2021-06-01: 12.5 mg via ORAL
  Filled 2021-06-01: qty 0.5

## 2021-06-01 NOTE — ED Notes (Signed)
Patient ambulates to restroom as needed, no worsening SOB with ambulation. Coughing has become less frequent, still non-productive. Wishes to wait for second breathing treatment due to jitters. Given meal by family. Saturations continue to stay >95% on RA, wheezing still auscultated in all fields. Remains afebrile.

## 2021-06-01 NOTE — H&P (Signed)
History and Physical    Frederick Keller ZOX:[id number] DOB: [birth year] DOA: 06/01/2021  PCP: Lavone Orn, MD (Confirm with patient/family/NH records and if not entered, this has to be entered at [facility] point of entry) Patient coming from: Home  I have personally briefly reviewed patient's old medical records in [place]  Chief Complaint: Cough, wheezing, shortness of breath.  HPI: Frederick Keller is a 66 y.o. male with medical history significant of chronic HFrEF with recovered LVEF, IIDM, HTN, arthritis, presented with worsening of cough, wheezing, shortness of breath.  Symptoms started 7 to 8 days ago, with a dry cough and wheezing, denies any fever or chills, no chest pains.  Went to see PCP 4 days ago, given doxycycline and prednisone pack, despite taking, patient continued to have wheezing and shortness of breath, he checked his pulse ox in [employer] last night was in the lower 80s.  He said even short distance of walking inside the room causing significant shortness of breath and cough and wheezing.  Denies any leg swelling.  He has a chronic HFrEF, with recovered LVEF, but no recent echocardiogram study.  He has seasonal allergies but denied any symptoms of coughing or wheezing before.  Denies any history of GERD, no new medications, new pets or decorations.  He quit smoking in 1988.  ED Course: O2 saturation 92% on room air, desat easily with short distance walk.  Temperature 99.5, blood pressure 130/80.  Chest x-ray showed chronic bronchitis.  Review of Systems: As per HPI otherwise 14 point review of systems negative.    Past Medical History:  Diagnosis Date   Arthritis    CHF (congestive heart failure) (HCC)    Diabetes mellitus    type 2-diet controlled   Essential hypertension 05/09/2016   Gout    High cholesterol    History of kidney stones    Nodule of right lung    lower-being monitored  and kidney for 5 year     Past Surgical History:  Procedure Laterality  Date   ANKLE SURGERY Right 1970   JOINT REPLACEMENT     rt knee-2008   KNEE ARTHROSCOPY Bilateral    x2 right, x1 left   LEFT HEART CATHETERIZATION WITH CORONARY ANGIOGRAM N/A 06/30/2014   Procedure: LEFT HEART CATHETERIZATION WITH CORONARY ANGIOGRAM;  Surgeon: Troy Sine, MD;  Location: [facility] CATH LAB;  Service: Cardiovascular;  Laterality: N/A;   LITHOTRIPSY     NASAL SEPTUM SURGERY  1997   NASAL SINUS SURGERY  1972   reconstruction with tonsils   NOSE SURGERY  1960   from horse accident   TONSILLECTOMY  age 79   TOTAL KNEE ARTHROPLASTY Left 02/25/2014   Procedure: LEFT TOTAL KNEE ARTHROPLASTY;  Surgeon: Mauri Pole, MD;  Location: [facility] ORS;  Service: Orthopedics;  Laterality: Left;   TOTAL SHOULDER ARTHROPLASTY Left 01/30/2020   Procedure: TOTAL SHOULDER ARTHROPLASTY;  Surgeon: Justice Britain, MD;  Location: [facility] ORS;  Service: Orthopedics;  Laterality: Left;  158min     reports that he quit smoking about 25 years ago. His smoking use included cigarettes. He has never used smokeless tobacco. He reports that he does not drink alcohol and does not use drugs.  Allergies  Allergen Reactions   Dilaudid [Hydromorphone Hcl] Nausea And Vomiting   Hydromorphone Nausea Only   Penicillins Hives   Atorvastatin Other (See Comments)   Lisinopril Cough    Family History  Problem Relation Age of Onset   CAD Mother  Heart disease Mother    Dementia Mother    CAD Father    Heart attack Father    Healthy Sister    Healthy Brother    Healthy Sister    Diabetes Brother    Kidney cancer Brother    Healthy Sister      Prior to Admission medications   Medication Sig Start Date End Date Taking? Authorizing Provider  AFLURIA QUADRIVALENT injection  12/24/20   [provider]  allopurinol (ZYLOPRIM) 300 MG tablet Take 300 mg by mouth at bedtime.     [provider]  aspirin 81 MG tablet Take 81 mg by mouth daily.    [provider]  cholecalciferol (VITAMIN D)  1000 UNITS tablet Take 1,000 Units by mouth at bedtime.     [provider]  cyanocobalamin (,VITAMIN B-12,) 1000 MCG/ML injection Inject 1,000 mcg into the muscle every 30 (thirty) days. 2nd Wednesday of the month.    [provider]  cyclobenzaprine (FLEXERIL) 5 MG tablet 1 tablet 06/15/19   [provider]  fexofenadine (ALLEGRA) 180 MG tablet Take 180 mg by mouth daily.    [provider]  indomethacin (INDOCIN) 50 MG capsule 1 capsule with food or milk 01/15/20   [provider]  losartan (COZAAR) 25 MG tablet Take 12.5 mg by mouth at bedtime. 1/2 TABLET (12.5MG ) DAILY     [provider]  metoprolol tartrate (LOPRESSOR) 25 MG tablet TAKE 1 TABLET BY MOUTH  TWICE DAILY 03/18/21   Hilty, Nadean Corwin, MD  [facility] VERIO test strip 1 each daily. 04/14/21   [provider]  PFIZER COVID-19 VAC BIVALENT injection  12/18/20   [provider]  pramipexole (MIRAPEX) 0.5 MG tablet Take 0.5 mg by mouth at bedtime. 01/19/21   [provider]  pravastatin (PRAVACHOL) 40 MG tablet Take 40 mg by mouth daily.    [provider]  predniSONE (DELTASONE) 10 MG tablet Take by mouth. 05/06/20   [provider]  tamsulosin (FLOMAX) 0.4 MG CAPS capsule Take 0.4 mg by mouth daily as needed (kidney stones).     [provider]    Physical Exam: Vitals:   06/01/21 1248 06/01/21 1430 06/01/21 1515 06/01/21 1630  BP: (!) 148/95 131/80 121/78 (!) 113/59  Pulse: 91 80 93 87  Resp: 20 (!) 22 17 16   Temp: 99.5 F (37.5 C)     TempSrc: Oral     SpO2: 94% 92% 95% 96%    Constitutional: NAD, calm, comfortable Vitals:   06/01/21 1248 06/01/21 1430 06/01/21 1515 06/01/21 1630  BP: (!) 148/95 131/80 121/78 (!) 113/59  Pulse: 91 80 93 87  Resp: 20 (!) 22 17 16   Temp: 99.5 F (37.5 C)     TempSrc: Oral     SpO2: 94% 92% 95% 96%   Eyes: PERRL, lids and conjunctivae normal ENMT: Mucous membranes are moist.  Posterior pharynx clear of any exudate or lesions.Normal dentition.  Neck: normal, supple, no masses, no thyromegaly Respiratory: clear to auscultation bilaterally, diffused wheezing, no crackles.  Increasing respiratory effort. No accessory muscle use.  Cardiovascular: Regular rate and rhythm, no murmurs / rubs / gallops. No extremity edema. 2+ pedal pulses. No carotid bruits.  Abdomen: no tenderness, no masses palpated. No hepatosplenomegaly. Bowel sounds positive.  Musculoskeletal: no clubbing / cyanosis. No joint deformity upper and lower extremities. Good ROM, no contractures. Normal muscle tone.  Skin: no rashes, lesions, ulcers. No induration Neurologic: CN 2-12 grossly intact. Sensation  intact, DTR normal. Strength 5/5 in all 4.  Psychiatric: Normal judgment and insight. Alert and oriented x 3. Normal mood.     Labs on Admission: I have personally reviewed following labs and imaging studies  CBC: Recent Labs  Lab 06/01/21 1259  WBC 10.0  NEUTROABS 8.5*  HGB 16.7  HCT 51.4  MCV 99.2  PLT 951*   Basic Metabolic Panel: Recent Labs  Lab 06/01/21 1259  NA 134*  K 4.3  CL 97*  CO2 28  GLUCOSE 187*  BUN 19  CREATININE 1.04  CALCIUM 9.6   GFR: CrCl cannot be calculated (Unknown ideal weight.). Liver Function Tests: Recent Labs  Lab 06/01/21 1259  AST 34  ALT 28  ALKPHOS 87  BILITOT 0.6  PROT 7.4  ALBUMIN 3.8   No results for input(s): LIPASE, AMYLASE in the last 168 hours. No results for input(s): AMMONIA in the last 168 hours. Coagulation Profile: Recent Labs  Lab 06/01/21 1259  INR 1.0   Cardiac Enzymes: No results for input(s): CKTOTAL, CKMB, CKMBINDEX, TROPONINI in the last 168 hours. BNP (last 3 results) No results for input(s): PROBNP in the last 8760 hours. HbA1C: No results for input(s): HGBA1C in the last 72 hours. CBG: No results for input(s): GLUCAP in the last 168 hours. Lipid Profile: No results for input(s): CHOL, HDL, LDLCALC, TRIG,  CHOLHDL, LDLDIRECT in the last 72 hours. Thyroid Function Tests: No results for input(s): TSH, T4TOTAL, FREET4, T3FREE, THYROIDAB in the last 72 hours. Anemia Panel: No results for input(s): VITAMINB12, FOLATE, FERRITIN, TIBC, IRON, RETICCTPCT in the last 72 hours. Urine analysis:    Component Value Date/Time   COLORURINE YELLOW 06/01/2021 [place] 06/01/2021 1321   LABSPEC 1.021 06/01/2021 1321   PHURINE 5.0 06/01/2021 1321   GLUCOSEU NEGATIVE 06/01/2021 1321   HGBUR SMALL (A) 06/01/2021 1321   BILIRUBINUR NEGATIVE 06/01/2021 1321   KETONESUR NEGATIVE 06/01/2021 1321   PROTEINUR 100 (A) 06/01/2021 1321   UROBILINOGEN 1.0 02/18/2014 1006   NITRITE NEGATIVE 06/01/2021 1321   LEUKOCYTESUR NEGATIVE 06/01/2021 1321    Radiological Exams on Admission: DG Chest 2 View  Result Date: 06/01/2021 CLINICAL DATA:  Shortness of breath, pain for 2 weeks getting worse, worsened breathing, weakness EXAM: CHEST - 2 VIEW COMPARISON:  12/11/2017 FINDINGS: Normal heart size, mediastinal contours, and pulmonary vascularity. Azygous fissure. Atherosclerotic calcification aorta. Bronchitic changes with minimal chronic accentuation of basilar markings stable. No infiltrate, pleural effusion, pneumothorax, or acute osseous findings. LEFT shoulder prosthesis noted. IMPRESSION: Mild chronic bronchitic changes without infiltrate. Aortic Atherosclerosis (ICD10-I70.0). Electronically Signed   By: Lavonia Dana M.D.   On: 06/01/2021 13:13    EKG: Independently reviewed. Sinus, no acute ST-T changes.  Assessment/Plan Principal Problem:   Bronchitis  (please populate well all problems here in Problem List. (For example, if patient is on BP meds at home and you resume or decide to hold them, it is a problem that needs to be her. Same for CAD, COPD, HLD and so on)  Acute bronchitis with acute hypoxic respite failure -Suspect undiagnosed COPD/asthma. -Short course of p.o. steroid, check VBG for  evidence of retention.  Change antibiotics from doxycycline to Levaquin.  Check atypical infection such as Legionella and mycoplasma. -Inhaled steroid, LABA, S ABA. -Recommend outpatient follow-up with pulmonology for PFT -Other DDx, given the symptoms and clinical presentation, CHF decompensation unlikely, patient appears to be euvolemic, blood pressure fairly controlled.  Will do echocardiogram.  Is morbid obese, no history of  GERD but does complain about worsening of wheezing and a cough in the mornings.  Trial of PPI, outpatient GI follow-up to rule out silent reflux.  Patient has been taking ACEI for many years, if other etiology ruled out, should consider change ACEI to other BP meds. -Check ambulatory pulse ox tomorrow, if stable, consider discharge home  HTN -Continue ACEI  Morbid obesity -Calorie control recommended  IIDM with hyperglycemia -Check A1c.  Outpatient PCP follow-up.  History of Chronic HFrEF with recovered LVEF -Euvolemic, echo pending, hold off diuresis.  DVT prophylaxis: Lovenox Code Status: Full code Family Communication: None at bedside Disposition Plan: Expect less than 2 midnight hospital stay. Consults called: None Admission status: MedSurg observation   Lequita Halt MD [facility] Pager [phone]  06/01/2021, 4:51 PM

## 2021-06-01 NOTE — ED Provider Notes (Signed)
[facility] EMERGENCY DEPARTMENT Provider Note   CSN: [id number] Arrival date & time: 06/01/21  1242     History  Chief Complaint  Patient presents with   Pneumonia    WEST BOOMERSHINE is a 66 y.o. male.  Patient with CHF and diabetes, and hypertension presents today with shortness of breath. He states that same began 8 days ago with cough, congestion, and wheezing, was seen at his PCP office on 2/25 who diagnosed him with pneumonia and gave him prednisone, albuterol, doxycycline, and tessalon which he has been taking as prescribed. Denies any improvement with this. States that in the past 2 days he has begun to be unable to walk around his house or lay flat without significant shortness of breath. States that he checked his pulse ox and noted it to be in the 80s, went back to his PCP today who noted his oxygen to drop to 80% and subsequently sent him here for further evaluation and management. Denies any leg or abdominal swelling or weight gain. No leg pain or history of blood clots. Denies chest pain. Patient states that he used to smoke a significant amount but quit in 1998.    The history is provided by the patient. No language interpreter was used.  Pneumonia Associated symptoms include shortness of breath. Pertinent negatives include no chest pain and no abdominal pain.      Home Medications Prior to Admission medications   Medication Sig Start Date End Date Taking? Authorizing Provider  AFLURIA QUADRIVALENT injection  12/24/20   [provider]  allopurinol (ZYLOPRIM) 300 MG tablet Take 300 mg by mouth at bedtime.     [provider]  aspirin 81 MG tablet Take 81 mg by mouth daily.    [provider]  cholecalciferol (VITAMIN D) 1000 UNITS tablet Take 1,000 Units by mouth at bedtime.     [provider]  cyanocobalamin (,VITAMIN B-12,) 1000 MCG/ML injection Inject 1,000 mcg into the muscle every 30 (thirty) days. 2nd Wednesday  of the month.    [provider]  cyclobenzaprine (FLEXERIL) 5 MG tablet 1 tablet 06/15/19   [provider]  fexofenadine (ALLEGRA) 180 MG tablet Take 180 mg by mouth daily.    [provider]  indomethacin (INDOCIN) 50 MG capsule 1 capsule with food or milk 01/15/20   [provider]  losartan (COZAAR) 25 MG tablet Take 12.5 mg by mouth at bedtime. 1/2 TABLET (12.5MG ) DAILY     [provider]  metoprolol tartrate (LOPRESSOR) 25 MG tablet TAKE 1 TABLET BY MOUTH  TWICE DAILY 03/18/21   Hilty, Nadean Corwin, MD  [facility] VERIO test strip 1 each daily. 04/14/21   [provider]  PFIZER COVID-19 VAC BIVALENT injection  12/18/20   [provider]  pramipexole (MIRAPEX) 0.5 MG tablet Take 0.5 mg by mouth at bedtime. 01/19/21   [provider]  pravastatin (PRAVACHOL) 40 MG tablet Take 40 mg by mouth daily.    [provider]  predniSONE (DELTASONE) 10 MG tablet Take by mouth. 05/06/20   [provider]  tamsulosin (FLOMAX) 0.4 MG CAPS capsule Take 0.4 mg by mouth daily as needed (kidney stones).     [provider]      Allergies    Dilaudid [hydromorphone hcl], Hydromorphone, Penicillins, Atorvastatin, and Lisinopril    Review of Systems   Review of Systems  Constitutional:  Negative for chills and fever.  HENT:  Negative for congestion.  Respiratory:  Positive for cough, shortness of breath and wheezing. Negative for apnea, choking, chest tightness and stridor.   Cardiovascular:  Negative for chest pain and leg swelling.  Gastrointestinal:  Negative for abdominal pain, diarrhea, nausea and vomiting.  All other systems reviewed and are negative.  Physical Exam Updated Vital Signs BP 131/80    Pulse 80    Temp 99.5 F (37.5 C) (Oral)    Resp (!) 22    SpO2 92%  Physical Exam Vitals and nursing note reviewed.  Constitutional:      General: He is not in acute distress.    Appearance: Normal  appearance. He is obese. He is not ill-appearing, toxic-appearing or diaphoretic.     Comments: Patient sitting upright in bed with some audible wheezing present speaking in complete sentences in no acute distress  HENT:     Head: Normocephalic and atraumatic.  Eyes:     Extraocular Movements: Extraocular movements intact.     Pupils: Pupils are equal, round, and reactive to light.  Cardiovascular:     Rate and Rhythm: Normal rate and regular rhythm.     Pulses: Normal pulses.     Heart sounds: Normal heart sounds.  Pulmonary:     Effort: Pulmonary effort is normal. No respiratory distress.     Breath sounds: Wheezing present.  Abdominal:     General: Abdomen is flat.     Palpations: Abdomen is soft.  Musculoskeletal:        General: No tenderness or deformity. Normal range of motion.     Cervical back: Normal range of motion and neck supple.     Right lower leg: No edema.     Left lower leg: No edema.  Skin:    General: Skin is warm and dry.  Neurological:     General: No focal deficit present.     Mental Status: He is alert.  Psychiatric:        Mood and Affect: Mood normal.        Behavior: Behavior normal.    ED Results / Procedures / Treatments   Labs (all labs ordered are listed, but only abnormal results are displayed) Labs Reviewed  COMPREHENSIVE METABOLIC PANEL - Abnormal; Notable for the following components:      Result Value   Sodium 134 (*)    Chloride 97 (*)    Glucose, Bld 187 (*)    All other components within normal limits  CBC WITH DIFFERENTIAL/PLATELET - Abnormal; Notable for the following components:   Platelets 149 (*)    Neutro Abs 8.5 (*)    Abs Immature Granulocytes 0.08 (*)    All other components within normal limits  URINALYSIS, ROUTINE W REFLEX MICROSCOPIC - Abnormal; Notable for the following components:   Hgb urine dipstick SMALL (*)    Protein, ur 100 (*)    Bacteria, UA RARE (*)    All other components within normal limits  CULTURE,  BLOOD (ROUTINE X 2)  CULTURE, BLOOD (ROUTINE X 2)  RESP PANEL BY RT-PCR (FLU A&B, COVID) ARPGX2  LACTIC ACID, PLASMA  PROTIME-INR  LACTIC ACID, PLASMA  BRAIN NATRIURETIC PEPTIDE  TROPONIN I (HIGH SENSITIVITY)  TROPONIN I (HIGH SENSITIVITY)    EKG None  Radiology DG Chest 2 View  Result Date: 06/01/2021 CLINICAL DATA:  Shortness of breath, pain for 2 weeks getting worse, worsened breathing, weakness EXAM: CHEST - 2 VIEW COMPARISON:  12/11/2017 FINDINGS: Normal heart size, mediastinal contours, and pulmonary vascularity. Azygous fissure. Atherosclerotic  calcification aorta. Bronchitic changes with minimal chronic accentuation of basilar markings stable. No infiltrate, pleural effusion, pneumothorax, or acute osseous findings. LEFT shoulder prosthesis noted. IMPRESSION: Mild chronic bronchitic changes without infiltrate. Aortic Atherosclerosis (ICD10-I70.0). Electronically Signed   By: Lavonia Dana M.D.   On: 06/01/2021 13:13    Procedures Procedures    Medications Ordered in ED ipratropium-albuterol (DUONEB) 0.5-2.5 (3) MG/3ML nebulizer solution 3 mL (3 mLs Nebulization Given 06/01/21 1502)  methylPREDNISolone sodium succinate (SOLU-MEDROL) 125 mg/2 mL injection 125 mg (125 mg Intravenous Given 06/01/21 1629)    ED Course/ Medical Decision Making/ A&P                           Medical Decision Making Amount and/or Complexity of Data Reviewed Labs: ordered. Radiology: ordered.  Risk Prescription drug management. Decision regarding hospitalization.   This patient presents to the ED for concern of shortness of breath, this involves an extensive number of treatment options, and is a complaint that carries with it a high risk of complications and morbidity.  The differential diagnosis includes pneumonia, CHF exacerbation, COPD, PE, ACS   Co morbidities that complicate the patient evaluation  CHF, diabetes, htn   Additional history obtained:  Additional history obtained from  PCP notes from previous visits  Lab Tests:  I Ordered, and personally interpreted labs.  The pertinent results include:  no leukocytosis or anemia, troponins negative   Imaging Studies ordered:  I ordered imaging studies including CXR  I independently visualized and interpreted imaging which showed mild chronic bronchitic changes without infiltrate I agree with the radiologist interpretation   Cardiac Monitoring:  The patient was maintained on a cardiac monitor.  I personally viewed and interpreted the cardiac monitored which showed an underlying rhythm of: NSR   Medicines ordered and prescription drug management:  I ordered medication including duonebs and solu-medrol  for shortness of breath, wheezing  Reevaluation of the patient after these medicines showed that the patient improved I have reviewed the patients home medicines and have made adjustments as needed   Test Considered:  Considered CTPE, however given diffuse wheezing on presentation without chest pain, suspect patient's symptoms are due to undiagnosed COPD or asthma rather than PE, therefore with defer CT chest for now.   Reevaluation:  After the interventions noted above, I reevaluated the patient and found that they have :improved   Dispostion:  After consideration of the diagnostic results and the patients response to treatment, I feel that the patent would benefit from inpatient admission for acute bronchitis with hypoxia.  Patient with diffuse wheezing throughout with minimal improvement after duonebs and Solu-Medrol. Currently maintaining on room air satting 95% on room air, however patient desats to 80 simply getting up and moving in the room. Therefore will seek admission for continued evaluation and management. Discussed this with patient who is understanding and amenable with plan.  Discussed patient with hospitalist who agrees to admit    Final Clinical Impression(s) / ED Diagnoses Final  diagnoses:  Bronchitis  Hypoxia    Rx / DC Orders ED Discharge Orders     None         Nestor Lewandowsky 06/01/21 1756    Davonna Belling, MD 06/02/21 [phone]

## 2021-06-01 NOTE — ED Triage Notes (Signed)
Patient sent from PCP for complaint of cough and shortness of breath that started on nine days ago, Started antibiotics last Saturday with no improvement. Patient states shortness of breath worse when laying flat. SpO2 95% on room air in triage, speaking in complete sentences.

## 2021-06-02 ENCOUNTER — Other Ambulatory Visit (HOSPITAL_COMMUNITY): Payer: Medicare Other

## 2021-06-02 DIAGNOSIS — J40 Bronchitis, not specified as acute or chronic: Secondary | ICD-10-CM | POA: Diagnosis not present

## 2021-06-02 DIAGNOSIS — N182 Chronic kidney disease, stage 2 (mild): Secondary | ICD-10-CM | POA: Diagnosis not present

## 2021-06-02 MED ORDER — ALBUTEROL SULFATE (2.5 MG/3ML) 0.083% IN NEBU: 2.5000 mg | INHALATION_SOLUTION | RESPIRATORY_TRACT | 0 refills | Status: AC | PRN

## 2021-06-02 MED ORDER — AZITHROMYCIN 250 MG PO TABS: ORAL_TABLET | ORAL | 0 refills | Status: DC

## 2021-06-02 MED ORDER — BENZONATATE 100 MG PO CAPS: 100.0000 mg | ORAL_CAPSULE | Freq: Three times a day (TID) | ORAL | 0 refills | Status: DC | PRN

## 2021-06-02 MED ORDER — PREDNISONE 20 MG PO TABS: 40.0000 mg | ORAL_TABLET | Freq: Every day | ORAL | 0 refills | Status: DC

## 2021-06-02 MED ORDER — GUAIFENESIN ER 600 MG PO TB12: 1200.0000 mg | ORAL_TABLET | Freq: Two times a day (BID) | ORAL | 0 refills | Status: DC

## 2021-06-02 NOTE — Progress Notes (Signed)
CM consulted for home Nebulizer machine. Patient has no preference with Company ordered from. CM called and spoke with Thedore Mins with [employer] and Nebulizer machine will be delivered at bedside. No other TOC recommendations noted at this time. Patient is scheduled for discharge. Family to transport at discharge. No further TOC needs noted. ?

## 2021-06-02 NOTE — Progress Notes (Signed)
New Admission Note:  ? ?Arrival Method: via wheelchair from ED ?Mental Orientation: alert and oriented x4 ?Telemetry: N/A ?Assessment: to be completed ?Skin: Intact ?IV: LFA, saline locked ?Pain: 0/10 ?Tubes: None ?Safety Measures: Safety Fall Prevention Plan has been discussed  ?Admission: to be completed ?[address] [place] Orientation: Patient has been oriented to the room, unit and staff.   ?Family: none at bedside ? ?Orders to be reviewed and implemented. Will continue to monitor the patient. Call light has been placed within reach and bed alarm has been activated.  ? ?

## 2021-06-02 NOTE — Progress Notes (Signed)
Patient being discharged. Carla RN reviewed discharge instructions with patient. Nebulizer machine delivered at beside for patient to take home. Ambulated with staff in hallway maintaining 93% or higher oxygen level on room air.  ?

## 2021-06-02 NOTE — TOC Transition Note (Signed)
Transition of Care (TOC) - CM/SW Discharge Note ? ? ?Patient Details  ?Name: Frederick Keller ?MRN: [id number] ?Date of Birth: 1955-11-10 ? ?Transition of Care (TOC) CM/SW Contact:  ?Carles Collet, RN ?Phone Number: ?06/02/2021, 9:53 AM ? ? ?Clinical Narrative:    ?Patient to transition home today. ?Nebulizer ordered to room for DC. ?NO other TOC needs identified.  ? ? ? ?Final next level of care: Home/Self Care ?Barriers to Discharge: No Barriers Identified ? ? ?Patient Goals and CMS Choice ?  ?  ?  ? ?Discharge Placement ?  ?           ?  ?  ?  ?  ? ?Discharge Plan and Services ?  ?  ?           ?DME Arranged: Nebulizer machine ?DME Agency: [employer] ?Date DME Agency Contacted: 06/02/21 ?Time DME Agency Contacted: [phone] ?Representative spoke with at DME Agency: Freda Munro ?  ?  ?  ?  ?  ? ?Social Determinants of Health (SDOH) Interventions ?  ? ? ?Readmission Risk Interventions ?No flowsheet data found. ? ? ? ? ?

## 2021-06-02 NOTE — Discharge Summary (Signed)
Physician Discharge Summary   Patient: Frederick Keller MRN: [id number] DOB: 06/07/1955  Admit date:     06/01/2021  Discharge date: 06/02/21  Discharge Physician: Patrecia Pour   PCP: Lavone Orn, MD   Recommendations at discharge:  Follow up with PCP in 1-2 weeks.  Discharged on azithromycin, prednisone, nebulized albuterol (new DME provided), antitussives.  Suggest formal PFTs after resolution of acute illness.  Discharge Diagnoses: Principal Problem:   Bronchitis  Hospital Course: Frederick Keller is a 66 y.o. male with medical history significant of chronic HFrEF with recovered LVEF, IIDM, HTN, arthritis, presented with worsening of cough, wheezing, shortness of breath.   Symptoms started 7 to 8 days ago, with a dry cough and wheezing, denies any fever or chills, no chest pains.  Went to see PCP 4 days ago, given doxycycline and prednisone pack, despite taking, patient continued to have wheezing and shortness of breath, he checked his pulse ox in [employer] last night was in the lower 80s.  He said even short distance of walking inside the room causing significant shortness of breath and cough and wheezing.  Denies any leg swelling.   He has a chronic HFrEF, with recovered LVEF, but no recent echocardiogram study.  He has seasonal allergies but denied any symptoms of coughing or wheezing before.  Denies any history of GERD, no new medications, new pets or decorations.  He quit smoking in 1988.   ED Course: Afebrile but wheezing with CXR showing no focal infiltrate. Desaturated quickly with significant dyspnea on exertion. Still wheezing despite treatment, so admission requested. The following day the patient reported significant improvement in dyspnea, resolution of wheezing, and has no hypoxia or dyspnea on ambulation this AM.   Assessment and Plan: Acute hypoxic respiratory failure due to acute bronchitis: Suspect greatest contributor to improvement was nebulized bronchodilator as  inhaler did not seem to help much at home and other treatments are similar to PTA meds.  - Hypoxia resolved. - Arrange nebulizer DME and sent albuterol solution to pharmacy. Counseled patient to use this prn.  - Continue po prednisone burst.  - Rx azithromycin for antiinflammatory effect, no focal consolidation on CXR.  -Recommend outpatient follow-up with pulmonology for PFT    HTN -Continue ACEI   Obesity: Body mass index is 32.61 kg/m.  -Calorie control recommended   IIDM with hyperglycemia: HbA1c 6.1%.  - Continue carb-controlled diet and PCP follow up   History of chronic HFrEF with recovered LVEF: Appears euvolemic, no signs or symptoms of CHF, so will not need echo during hospitalization.   Consultants: None Procedures performed: None  Disposition: Home Diet recommendation:  Cardiac and Carb modified diet  DISCHARGE MEDICATION: Allergies as of 06/02/2021       Reactions   Dilaudid [hydromorphone Hcl] Nausea And Vomiting   Hydromorphone Nausea Only   Penicillins Hives   Atorvastatin Other (See Comments)   Lisinopril Cough        Medication List     STOP taking these medications    albuterol 108 (90 Base) MCG/ACT inhaler Commonly known as: VENTOLIN HFA Replaced by: albuterol (2.5 MG/3ML) 0.083% nebulizer solution   doxycycline 100 MG tablet Commonly known as: VIBRA-TABS       TAKE these medications    albuterol (2.5 MG/3ML) 0.083% nebulizer solution Commonly known as: PROVENTIL Take 3 mLs (2.5 mg total) by nebulization every 4 (four) hours as needed for wheezing or shortness of breath. Replaces: albuterol 108 (90 Base) MCG/ACT inhaler   allopurinol  300 MG tablet Commonly known as: ZYLOPRIM Take 300 mg by mouth at bedtime.   aspirin 81 MG tablet Take 81 mg by mouth daily.   atorvastatin 40 MG tablet Commonly known as: LIPITOR Take 40 mg by mouth daily.   azithromycin 250 MG tablet Commonly known as: ZITHROMAX Take 2 tablets by mouth today,  take 1 tablet daily for the next 4 days.   benzonatate 100 MG capsule Commonly known as: TESSALON Take 1 capsule (100 mg total) by mouth 3 (three) times daily as needed for cough.   cholecalciferol 1000 units tablet Commonly known as: VITAMIN D Take 1,000 Units by mouth at bedtime.   cyanocobalamin 1000 MCG/ML injection Commonly known as: (VITAMIN B-12) Inject 1,000 mcg into the muscle every 30 (thirty) days. 2nd Wednesday of the month.   fexofenadine 180 MG tablet Commonly known as: ALLEGRA Take 180 mg by mouth daily.   guaiFENesin 600 MG 12 hr tablet Commonly known as: MUCINEX Take 2 tablets (1,200 mg total) by mouth 2 (two) times daily.   losartan 25 MG tablet Commonly known as: COZAAR Take 12.5 mg by mouth at bedtime. 1/2 TABLET (12.5MG ) DAILY   metoprolol tartrate 25 MG tablet Commonly known as: LOPRESSOR TAKE 1 TABLET BY MOUTH  TWICE DAILY   OneTouch Verio test strip Generic drug: glucose blood 1 each daily.   predniSONE 20 MG tablet Commonly known as: DELTASONE Take 2 tablets (40 mg total) by mouth daily with breakfast. What changed:  how much to take when to take this   tamsulosin 0.4 MG Caps capsule Commonly known as: FLOMAX Take 0.4 mg by mouth daily as needed (kidney stones).               Durable Medical Equipment  (From admission, onward)           Start     Ordered   06/02/21 0816  For home use only DME Nebulizer/meds  Once       Question Answer Comment  Patient needs a nebulizer to treat with the following condition Bronchitis   Length of Need 6 Months      06/02/21 0818            Follow-up Information     Lavone Orn, MD. Schedule an appointment as soon as possible for a visit.   Specialty: Internal Medicine Contact information: [address], [address] [place] [address] [phone]                Subjective: Significant improvement. No dyspnea or wheezing this morning. Laying completely flat to  sleep, no orthopnea, PND, leg swelling, chest pain, palpitations or weight gain.   Discharge Exam: Filed Weights   06/02/21 0136  Weight: 103.1 kg  BP 123/78 (BP Location: Left Arm)    Pulse 90    Temp 97.6 F (36.4 C) (Oral)    Resp 18    Ht 5\' 10"  (1.778 m)    Wt 103.1 kg    SpO2 94%    BMI 32.61 kg/m   Gen: No distress, HOH Pulm: Clear, nonlabored CV: RRR, no edema or JVD GI: Soft, NT Neuro: Alert, oriented, normal gait.    Condition at discharge: good  The results of significant diagnostics from this hospitalization (including imaging, microbiology, ancillary and laboratory) are listed below for reference.   Imaging Studies: DG Chest 2 View  Result Date: 06/01/2021 CLINICAL DATA:  Shortness of breath, pain for 2 weeks getting worse, worsened breathing, weakness EXAM:  CHEST - 2 VIEW COMPARISON:  12/11/2017 FINDINGS: Normal heart size, mediastinal contours, and pulmonary vascularity. Azygous fissure. Atherosclerotic calcification aorta. Bronchitic changes with minimal chronic accentuation of basilar markings stable. No infiltrate, pleural effusion, pneumothorax, or acute osseous findings. LEFT shoulder prosthesis noted. IMPRESSION: Mild chronic bronchitic changes without infiltrate. Aortic Atherosclerosis (ICD10-I70.0). Electronically Signed   By: Lavonia Dana M.D.   On: 06/01/2021 13:13    Microbiology: Results for orders placed or performed during the hospital encounter of 06/01/21  Resp Panel by RT-PCR (Flu A&B, Covid) Nasopharyngeal Swab     Status: None   Collection Time: 06/01/21  2:59 PM   Specimen: Nasopharyngeal Swab; Nasopharyngeal(NP) swabs in vial transport medium  Result Value Ref Range Status   SARS Coronavirus 2 by RT PCR NEGATIVE NEGATIVE Final    Comment: (NOTE) SARS-CoV-2 target nucleic acids are NOT DETECTED.  The SARS-CoV-2 RNA is generally detectable in upper respiratory specimens during the acute phase of infection. The lowest concentration of SARS-CoV-2  viral copies this assay can detect is 138 copies/mL. A negative result does not preclude SARS-Cov-2 infection and should not be used as the sole basis for treatment or other patient management decisions. A negative result may occur with  improper specimen collection/handling, submission of specimen other than nasopharyngeal swab, presence of viral mutation(s) within the areas targeted by this assay, and inadequate number of viral copies(<138 copies/mL). A negative result must be combined with clinical observations, patient history, and epidemiological information. The expected result is Negative.  Fact Sheet for Patients:  EntrepreneurPulse.com.au  Fact Sheet for Healthcare Providers:  IncredibleEmployment.be  This test is no t yet approved or cleared by the [place] FDA and  has been authorized for detection and/or diagnosis of SARS-CoV-2 by FDA under an Emergency Use Authorization (EUA). This EUA will remain  in effect (meaning this test can be used) for the duration of the COVID-19 declaration under Section 564(b)(1) of the Act, 21 U.S.C.section 360bbb-3(b)(1), unless the authorization is terminated  or revoked sooner.       Influenza A by PCR NEGATIVE NEGATIVE Final   Influenza B by PCR NEGATIVE NEGATIVE Final    Comment: (NOTE) The Xpert Xpress SARS-CoV-2/FLU/RSV plus assay is intended as an aid in the diagnosis of influenza from Nasopharyngeal swab specimens and should not be used as a sole basis for treatment. Nasal washings and aspirates are unacceptable for Xpert Xpress SARS-CoV-2/FLU/RSV testing.  Fact Sheet for Patients: EntrepreneurPulse.com.au  Fact Sheet for Healthcare Providers: IncredibleEmployment.be  This test is not yet approved or cleared by the [place] FDA and has been authorized for detection and/or diagnosis of SARS-CoV-2 by FDA under an Emergency Use Authorization  (EUA). This EUA will remain in effect (meaning this test can be used) for the duration of the COVID-19 declaration under Section 564(b)(1) of the Act, 21 U.S.C. section 360bbb-3(b)(1), unless the authorization is terminated or revoked.  Performed at [facility], [place] [address] [place], [place] [address]     Labs: CBC: Recent Labs  Lab 06/01/21 1259 06/01/21 1700  WBC 10.0  --   NEUTROABS 8.5*  --   HGB 16.7 16.7  HCT 51.4 49.0  MCV 99.2  --   PLT 149*  --    Basic Metabolic Panel: Recent Labs  Lab 06/01/21 1259 06/01/21 1700  NA 134* 133*  K 4.3 5.1  CL 97*  --   CO2 28  --   GLUCOSE 187*  --   BUN 19  --  CREATININE 1.04  --   CALCIUM 9.6  --    Liver Function Tests: Recent Labs  Lab 06/01/21 1259  AST 34  ALT 28  ALKPHOS 87  BILITOT 0.6  PROT 7.4  ALBUMIN 3.8   CBG: No results for input(s): GLUCAP in the last 168 hours.  Discharge time spent: greater than 30 minutes.  Signed: Patrecia Pour, MD [facility] 06/02/2021

## 2021-06-03 LAB — MYCOPLASMA PNEUMONIAE ANTIBODY, IGM: Mycoplasma pneumo IgM: <770 U/mL (ref 0–769)

## 2021-06-03 LAB — LEGIONELLA PNEUMOPHILA SEROGP 1 UR AG: L. pneumophila Serogp 1 Ur Ag: NEGATIVE

## 2021-06-04 LAB — LACTIC ACID, PLASMA: Lactic Acid, Venous: 2.3 mmol/L (ref 0.5–1.9)

## 2021-06-06 LAB — CULTURE, BLOOD (ROUTINE X 2)
Culture: NO GROWTH
Culture: NO GROWTH
Special Requests: ADEQUATE

## 2021-06-10 DIAGNOSIS — J40 Bronchitis, not specified as acute or chronic: Secondary | ICD-10-CM | POA: Diagnosis not present

## 2021-06-10 DIAGNOSIS — I509 Heart failure, unspecified: Secondary | ICD-10-CM | POA: Diagnosis not present

## 2021-06-10 DIAGNOSIS — Z87891 Personal history of nicotine dependence: Secondary | ICD-10-CM | POA: Diagnosis not present

## 2021-07-01 ENCOUNTER — Encounter: Payer: Self-pay | Admitting: Emergency Medicine

## 2021-07-01 ENCOUNTER — Ambulatory Visit: Payer: Medicare Other | Admitting: Emergency Medicine

## 2021-07-01 DIAGNOSIS — J301 Allergic rhinitis due to pollen: Secondary | ICD-10-CM

## 2021-07-01 DIAGNOSIS — J449 Chronic obstructive pulmonary disease, unspecified: Secondary | ICD-10-CM

## 2021-07-01 DIAGNOSIS — E668 Other obesity: Secondary | ICD-10-CM

## 2021-07-01 NOTE — Assessment & Plan Note (Signed)
Asymptomatic at baseline with good functional capacity but he has clearly experienced flares.  The most recent was enough to make him hypoxemic in the setting of a URI, required admission.  He responded to steroids and antibiotics.  He does not use bronchodilators currently.  Discussed with him the utility of obtaining baseline pulmonary function testing to better define his degree of obstruction.  He has albuterol that he can use as needed.  He may benefit from scheduled BD therapy at some point going forward. ?

## 2021-07-01 NOTE — Addendum Note (Signed)
Addended by: Gavin Potters R on: 07/01/2021 11:13 AM ? ? Modules accepted: Orders ? ?

## 2021-07-01 NOTE — Assessment & Plan Note (Signed)
Chronic and stable.  He does not have significant cough except for every morning, nonproductive.  Currently managed on Allegra.  Could consider adding therapy if progresses, flaring. ?

## 2021-07-01 NOTE — Patient Instructions (Signed)
We will perform pulmonary function testing at your next office visit.  ?Keep albuterol available to use 2 puffs up to every 4 hours if needed for shortness of breath, chest tightness, wheezing.  ?Follow with Dr. Lamonte Sakai next available with full pulmonary function testing on the same day.  ? ?

## 2021-07-01 NOTE — Assessment & Plan Note (Signed)
At some risk for obstructive sleep apnea due to his weight and also his history of facial fractures.  He denies any sequela of OSA.  He does occasionally snore per his wife but no witnessed apneas.  We will hold off on PSG for now ?

## 2021-07-01 NOTE — Progress Notes (Signed)
? ?Subjective:  ? ? Patient ID: Frederick Keller, male    DOB: 05/04/1955, 66 y.o.   MRN: [id number] ? ?HPI ?66 year old former smoker (30 pack years) with a history of diabetes, hypertension, systolic CHF, hyperlipidemia.  He is referred today for evaluation of possible COPD. He had COVID in 03/2021, has had coughing ever since. Then he had another URI in late Feb, was treated for possible AE-COPD as an outpt, but then had to be admitted 2/28 because he was failing to respond - had dyspnea, wheezing, cough. Was found to be hypoxic. He has had flares before but never this severe. He has nasal congestion, has a L nasal reconstruction after a trauma as a child. When he is not acutely ill, he is not limited by his breathing. Able to be active. He does have a morning cough, usually non-productive. Occasionally snores. No witnessed apneas. No napping, no am HA.  ?On allegra, no other allergy meds. Has albuterol, never uses it.  ? ? ?Review of Systems ?As per HPI ? ?Past Medical History:  ?Diagnosis Date  ? Arthritis   ? CHF (congestive heart failure) ([place])   ? Diabetes mellitus   ? type 2-diet controlled  ? Essential hypertension 05/09/2016  ? Gout   ? High cholesterol   ? History of kidney stones   ? Nodule of right lung   ? lower-being monitored  and kidney for 5 year   ?  ? ?Family History  ?Problem Relation Age of Onset  ? CAD Mother   ? Heart disease Mother   ? Dementia Mother   ? CAD Father   ? Heart attack Father   ? Healthy Sister   ? Healthy Brother   ? Healthy Sister   ? Diabetes Brother   ? Kidney cancer Brother   ? Healthy Sister   ?  ? ?Social History  ? ?Socioeconomic History  ? Marital status: Married  ?  Spouse name: Not on file  ? Number of children: Not on file  ? Years of education: Not on file  ? Highest education level: Not on file  ?Occupational History  ? Not on file  ?Tobacco Use  ? Smoking status: Former  ?  Years: 30.00  ?  Types: Cigarettes  ?  Quit date: 04/04/1996  ?  Years since quitting: 25.2  ?  Smokeless tobacco: Never  ?Vaping Use  ? Vaping Use: Never used  ?Substance and Sexual Activity  ? Alcohol use: No  ? Drug use: No  ? Sexual activity: Not Currently  ?Other Topics Concern  ? Not on file  ?Social History Narrative  ? Not on file  ? ?Social Determinants of Health  ? ?Financial Resource Strain: Not on file  ?Food Insecurity: Not on file  ?Transportation Needs: Not on file  ?Physical Activity: Not on file  ?Stress: Not on file  ?Social Connections: Not on file  ?Intimate Partner Violence: Not on file  ?  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  No known inhaled exposures during his service.  He was in [place] ? ?Allergies  ?Allergen Reactions  ? Dilaudid [Hydromorphone Hcl] Nausea And Vomiting  ? Hydromorphone Nausea Only  ? Penicillins Hives  ? Atorvastatin Other (See Comments)  ? Lisinopril Cough  ?  ? ?Outpatient Medications Prior to Visit  ?Medication Sig Dispense Refill  ? albuterol (PROVENTIL) (2.5 MG/3ML) 0.083% nebulizer solution Take 3 mLs (  2.5 mg total) by nebulization every 4 (four) hours as needed for wheezing or shortness of breath. 75 mL 0  ? allopurinol (ZYLOPRIM) 300 MG tablet Take 300 mg by mouth at bedtime.     ? aspirin 81 MG tablet Take 81 mg by mouth daily.    ? atorvastatin (LIPITOR) 40 MG tablet Take 40 mg by mouth daily.    ? azithromycin (ZITHROMAX) 250 MG tablet Take 2 tablets by mouth today, take 1 tablet daily for the next 4 days. 6 tablet 0  ? benzonatate (TESSALON) 100 MG capsule Take 1 capsule (100 mg total) by mouth 3 (three) times daily as needed for cough. 30 capsule 0  ? cholecalciferol (VITAMIN D) 1000 UNITS tablet Take 1,000 Units by mouth at bedtime.     ? cyanocobalamin (,VITAMIN B-12,) 1000 MCG/ML injection Inject 1,000 mcg into the muscle every 30 (thirty) days. 2nd Wednesday of the month.    ? fexofenadine (ALLEGRA) 180 MG tablet Take 180 mg by mouth daily.    ?  guaiFENesin (MUCINEX) 600 MG 12 hr tablet Take 2 tablets (1,200 mg total) by mouth 2 (two) times daily. 60 tablet 0  ? losartan (COZAAR) 25 MG tablet Take 12.5 mg by mouth at bedtime. 1/2 TABLET (12.'5MG'$ ) DAILY     ? metoprolol tartrate (LOPRESSOR) 25 MG tablet TAKE 1 TABLET BY MOUTH  TWICE DAILY 180 tablet 3  ? ONETOUCH VERIO test strip 1 each daily.    ? predniSONE (DELTASONE) 20 MG tablet Take 2 tablets (40 mg total) by mouth daily with breakfast. 8 tablet 0  ? tamsulosin (FLOMAX) 0.4 MG CAPS capsule Take 0.4 mg by mouth daily as needed (kidney stones).     ? ?No facility-administered medications prior to visit.  ? ? ? ?   ?Objective:  ? Physical Exam ?Vitals:  ? 07/01/21 0952  ?BP: 124/74  ?Pulse: 77  ?Temp: 98.1 ?F (36.7 ?C)  ?TempSrc: Oral  ?SpO2: 97%  ?Weight: 232 lb 12.8 oz (105.6 kg)  ?Height: '5\' 10"'$  (1.778 m)  ? ?Gen: Pleasant, overweight man, in no distress,  normal affect ? ?ENT: No lesions,  mouth clear,  oropharynx clear, no postnasal drip ? ?Neck: No JVD, no stridor ? ?Lungs: No use of accessory muscles, no crackles or wheezing on normal respiration, no wheeze on forced expiration ? ?Cardiovascular: RRR, heart sounds normal, no murmur or gallops, no peripheral edema ? ?Musculoskeletal: No deformities, no cyanosis or clubbing ? ?Neuro: alert, awake, non focal ? ?Skin: Warm, no lesions or rash ? ?   ?Assessment & Plan:  ?COPD (chronic obstructive pulmonary disease) ([place]) ?Asymptomatic at baseline with good functional capacity but he has clearly experienced flares.  The most recent was enough to make him hypoxemic in the setting of a URI, required admission.  He responded to steroids and antibiotics.  He does not use bronchodilators currently.  Discussed with him the utility of obtaining baseline pulmonary function testing to better define his degree of obstruction.  He has albuterol that he can use as needed.  He may benefit from scheduled BD therapy at some point going forward. ? ?Seasonal allergic  rhinitis ?Chronic and stable.  He does not have significant cough except for every morning, nonproductive.  Currently managed on Allegra.  Could consider adding therapy if progresses, flaring. ? ?Moderate obesity ?At some risk for obstructive sleep apnea due to his weight and also his history of facial fractures.  He denies any sequela of OSA.  He does occasionally  snore per his wife but no witnessed apneas.  We will hold off on PSG for now ? ?Baltazar Apo, MD, PhD ?07/01/2021, 10:43 AM ?[facility] ?[phone] or if no answer before 7:00PM call [phone] ?For any issues after 7:00PM please call eLink [phone] ? ? ?

## 2021-07-16 ENCOUNTER — Encounter: Payer: Self-pay | Admitting: Podiatry

## 2021-07-16 ENCOUNTER — Ambulatory Visit: Payer: Medicare Other | Admitting: Podiatry

## 2021-07-16 DIAGNOSIS — M722 Plantar fascial fibromatosis: Secondary | ICD-10-CM

## 2021-07-16 MED ORDER — TRIAMCINOLONE ACETONIDE 10 MG/ML IJ SUSP
10.0000 mg | Freq: Once | INTRAMUSCULAR | Status: AC
  Administered 2021-07-16: 10 mg

## 2021-07-18 NOTE — Progress Notes (Signed)
Subjective:  ? ?Patient ID: Frederick Keller, male   DOB: 66 y.o.   MRN: [id number]  ? ?HPI ?Patient states he has had a flareup of pain in his right heel and admits that he was on a ladder and that is when it seemed to occur ? ? ?ROS ? ? ?   ?Objective:  ?Physical Exam  ?Neurovascular status intact with acute inflammation of the plantar fascia right at the insertional point of the tendon into the calcaneus with fluid buildup ? ?   ?Assessment:  ?Acute plantar fasciitis right with inflammation fluid buildup ? ?   ?Plan:  ?H&P reviewed condition sterile prep and injected the fascia 3 mg Kenalog 5 mg Xylocaine gave instructions on anti-inflammatory and reappoint to recheck ? ?X-rays indicate small spur no indication stress fracture from previous views ?   ? ? ?

## 2021-07-20 DIAGNOSIS — I10 Essential (primary) hypertension: Secondary | ICD-10-CM | POA: Diagnosis not present

## 2021-07-20 DIAGNOSIS — N182 Chronic kidney disease, stage 2 (mild): Secondary | ICD-10-CM | POA: Diagnosis not present

## 2021-07-20 DIAGNOSIS — J302 Other seasonal allergic rhinitis: Secondary | ICD-10-CM | POA: Diagnosis not present

## 2021-07-20 DIAGNOSIS — E1122 Type 2 diabetes mellitus with diabetic chronic kidney disease: Secondary | ICD-10-CM | POA: Diagnosis not present

## 2021-07-20 DIAGNOSIS — I5020 Unspecified systolic (congestive) heart failure: Secondary | ICD-10-CM | POA: Diagnosis not present

## 2021-08-25 ENCOUNTER — Ambulatory Visit (INDEPENDENT_AMBULATORY_CARE_PROVIDER_SITE_OTHER): Payer: Medicare Other | Admitting: Emergency Medicine

## 2021-08-25 ENCOUNTER — Encounter: Payer: Self-pay | Admitting: Emergency Medicine

## 2021-08-25 ENCOUNTER — Ambulatory Visit: Payer: Medicare Other | Admitting: Emergency Medicine

## 2021-08-25 DIAGNOSIS — J301 Allergic rhinitis due to pollen: Secondary | ICD-10-CM

## 2021-08-25 DIAGNOSIS — J449 Chronic obstructive pulmonary disease, unspecified: Secondary | ICD-10-CM

## 2021-08-25 LAB — PULMONARY FUNCTION TEST
DL/VA % pred: 124 %
DL/VA: 5.14 ml/min/mmHg/L
DLCO cor % pred: 90 %
DLCO cor: 23.29 ml/min/mmHg
DLCO unc % pred: 90 %
DLCO unc: 23.29 ml/min/mmHg
FEF 25-75 Post: 3.32 L/sec
FEF 25-75 Pre: 1.48 L/sec
FEF2575-%Change-Post: 124 %
FEF2575-%Pred-Post: 128 %
FEF2575-%Pred-Pre: 57 %
FEV1-%Change-Post: 25 %
FEV1-%Pred-Post: 70 %
FEV1-%Pred-Pre: 56 %
FEV1-Post: 2.31 L
FEV1-Pre: 1.84 L
FEV1FVC-%Change-Post: 5 %
FEV1FVC-%Pred-Pre: 102 %
FEV6-%Change-Post: 18 %
FEV6-%Pred-Post: 68 %
FEV6-%Pred-Pre: 57 %
FEV6-Post: 2.85 L
FEV6-Pre: 2.41 L
FEV6FVC-%Pred-Post: 105 %
FEV6FVC-%Pred-Pre: 105 %
FVC-%Change-Post: 18 %
FVC-%Pred-Post: 64 %
FVC-%Pred-Pre: 54 %
FVC-Post: 2.85 L
FVC-Pre: 2.41 L
Post FEV1/FVC ratio: 81 %
Post FEV6/FVC ratio: 100 %
Pre FEV1/FVC ratio: 76 %
Pre FEV6/FVC Ratio: 100 %
RV % pred: 67 %
RV: 1.56 L
TLC % pred: 63 %
TLC: 4.3 L

## 2021-08-25 NOTE — Assessment & Plan Note (Signed)
We reviewed your pulmonary function testing today.  There is a pattern consistent with moderate COPD/asthma.  This probably explains why you occasionally have flares of your breathing that have required medications to reverse. You can use albuterol up to every 4 hours if needed for shortness of breath, chest tightness, wheezing.  Try using it intermittently, possibly before exertion, to see if it helps your overall breathing.  If so call our office and let us know.  We may decide to try starting a longer acting inhaled medication to see if you get benefit. Follow Dr. Lamonte Sakai in 6 months or sooner if you have any problems.

## 2021-08-25 NOTE — Progress Notes (Signed)
Full PFT Performed Today  

## 2021-08-25 NOTE — Patient Instructions (Signed)
Full PFT Performed Today  

## 2021-08-25 NOTE — Patient Instructions (Signed)
We reviewed your pulmonary function testing today.  There is a pattern consistent with moderate COPD/asthma.  This probably explains why you occasionally have flares of your breathing that have required medications to reverse. You can use albuterol up to every 4 hours if needed for shortness of breath, chest tightness, wheezing.  Try using it intermittently, possibly before exertion, to see if it helps your overall breathing.  If so call our office and let us know.  We may decide to try starting a longer acting inhaled medication to see if you get benefit. Follow Dr. Lamonte Sakai in 6 months or sooner if you have any problems.

## 2021-08-25 NOTE — Progress Notes (Signed)
Subjective:    Patient ID: Frederick Keller, male    DOB: 1955-05-21, 66 y.o.   MRN: [id number]  HPI 66 year old former smoker (30 pack years) with a history of diabetes, hypertension, systolic CHF, hyperlipidemia.  He is referred today for evaluation of possible COPD. He had COVID in 03/2021, has had coughing ever since. Then he had another URI in late Feb, was treated for possible AE-COPD as an outpt, but then had to be admitted 2/28 because he was failing to respond - had dyspnea, wheezing, cough. Was found to be hypoxic. He has had flares before but never this severe. He has nasal congestion, has a L nasal reconstruction after a trauma as a child. When he is not acutely ill, he is not limited by his breathing. Able to be active. He does have a morning cough, usually non-productive. Occasionally snores. No witnessed apneas. No napping, no am HA.  On allegra, no other allergy meds. Has albuterol, never uses it.    ROV 08/25/21 --follow-up visit for 66 year old gentleman, former smoker.  He had COVID-19 in 03/2021 which started chronic cough bothersome in the mornings and with allergy season.  He has suspected COPD with episodes of flaring in the setting of URI in the past.  We performed pulmonary function testing today.  He reports he is doing well, breathing well. Pretty good functional capacity.   Pulmonary function testing performed today and reviewed by me shows mixed obstruction and restriction with a positive bronchodilator response consistent with an asthma pattern.  His lung volumes confirm restriction.  His diffusion capacity is normal.   Review of Systems As per HPI  Past Medical History:  Diagnosis Date   Arthritis    CHF (congestive heart failure) ([place])    Diabetes mellitus    type 2-diet controlled   Essential hypertension 05/09/2016   Gout    High cholesterol    History of kidney stones    Nodule of right lung    lower-being monitored  and kidney for 5 year      Family  History  Problem Relation Age of Onset   CAD Mother    Heart disease Mother    Dementia Mother    CAD Father    Heart attack Father    Healthy Sister    Healthy Brother    Healthy Sister    Diabetes Brother    Kidney cancer Brother    Healthy Sister      Social History   Socioeconomic History   Marital status: Married    Spouse name: Not on file   Number of children: Not on file   Years of education: Not on file   Highest education level: Not on file  Occupational History   Not on file  Tobacco Use   Smoking status: Former    Years: 30.00    Types: Cigarettes    Quit date: 04/04/1996    Years since quitting: 25.4   Smokeless tobacco: Never  Vaping Use   Vaping Use: Never used  Substance and Sexual Activity   Alcohol use: No   Drug use: No   Sexual activity: Not Currently  Other Topics Concern   Not on file  Social History Narrative   Not on file   Social Determinants of Health   Financial Resource Strain: Not on file  Food Insecurity: Not on file  Transportation Needs: Not on file  Physical Activity: Not on file  Stress: Not on file  Social Connections:  Not on file  Intimate Partner Violence: Not on file    From [place] Has worked as a Dealer, 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  No known inhaled exposures during his service.  He was in [place]  Allergen Reactions   Dilaudid [Hydromorphone Hcl] Nausea And Vomiting   Hydromorphone Nausea Only   Penicillins Hives   Atorvastatin Other (See Comments)   Lisinopril Cough     Outpatient Medications Prior to Visit  Medication Sig Dispense Refill   albuterol (PROVENTIL) (2.5 MG/3ML) 0.083% nebulizer solution Take 3 mLs (2.5 mg total) by nebulization every 4 (four) hours as needed for wheezing or shortness of breath. 75 mL 0   allopurinol (ZYLOPRIM) 300 MG tablet Take 300 mg by mouth at bedtime.      aspirin 81 MG tablet Take 81 mg by mouth  daily.     atorvastatin (LIPITOR) 40 MG tablet Take 40 mg by mouth daily.     cholecalciferol (VITAMIN D) 1000 UNITS tablet Take 1,000 Units by mouth at bedtime.      cyanocobalamin (,VITAMIN B-12,) 1000 MCG/ML injection Inject 1,000 mcg into the muscle every 30 (thirty) days. 2nd Wednesday of the month.     fexofenadine (ALLEGRA) 180 MG tablet Take 180 mg by mouth daily.     losartan (COZAAR) 25 MG tablet Take 12.5 mg by mouth at bedtime. 1/2 TABLET (12.'5MG'$ ) DAILY      metoprolol tartrate (LOPRESSOR) 25 MG tablet TAKE 1 TABLET BY MOUTH  TWICE DAILY 180 tablet 3   montelukast (SINGULAIR) 10 MG tablet Take 10 mg by mouth daily.     ONETOUCH VERIO test strip 1 each daily.     pravastatin (PRAVACHOL) 40 MG tablet Take 40 mg by mouth daily.     tamsulosin (FLOMAX) 0.4 MG CAPS capsule Take 0.4 mg by mouth daily as needed (kidney stones).  (Patient not taking: Reported on 08/25/2021)     azithromycin (ZITHROMAX) 250 MG tablet Take 2 tablets by mouth today, take 1 tablet daily for the next 4 days. 6 tablet 0   benzonatate (TESSALON) 100 MG capsule Take 1 capsule (100 mg total) by mouth 3 (three) times daily as needed for cough. 30 capsule 0   guaiFENesin (MUCINEX) 600 MG 12 hr tablet Take 2 tablets (1,200 mg total) by mouth 2 (two) times daily. 60 tablet 0   predniSONE (DELTASONE) 20 MG tablet Take 2 tablets (40 mg total) by mouth daily with breakfast. 8 tablet 0   No facility-administered medications prior to visit.        Objective:   Physical Exam Vitals:   08/25/21 1117  BP: 118/64  Pulse: 72  Temp: 98.6 F (37 C)  TempSrc: Oral  SpO2: 94%  Weight: 230 lb 3.2 oz (104.4 kg)  Height: '5\' 9"'$  (1.753 m)   Gen: Pleasant, overweight man, in no distress,  normal affect  ENT: No lesions,  mouth clear,  oropharynx clear, no postnasal drip  Neck: No JVD, no stridor  Lungs: No use of accessory muscles, no crackles or wheezing on normal respiration, no wheeze on forced  expiration  Cardiovascular: RRR, heart sounds normal, no murmur or gallops, no peripheral edema  Musculoskeletal: No deformities, no cyanosis or clubbing  Neuro: alert, awake, non focal  Skin: Warm, no lesions or rash     Assessment & Plan:  COPD (chronic obstructive pulmonary disease) (HCC) We reviewed your pulmonary function testing today.  There is a  pattern consistent with moderate COPD/asthma.  This probably explains why you occasionally have flares of your breathing that have required medications to reverse. You can use albuterol up to every 4 hours if needed for shortness of breath, chest tightness, wheezing.  Try using it intermittently, possibly before exertion, to see if it helps your overall breathing.  If so call our office and let us know.  We may decide to try starting a longer acting inhaled medication to see if you get benefit. Follow Dr. Lamonte Sakai in 6 months or sooner if you have any problems.  Baltazar Apo, MD, PhD 08/25/2021, 11:49 AM  [facility] [phone] or if no answer before 7:00PM call [phone] For any issues after 7:00PM please call eLink [phone]

## 2021-09-15 DIAGNOSIS — R109 Unspecified abdominal pain: Secondary | ICD-10-CM | POA: Diagnosis not present

## 2021-09-15 DIAGNOSIS — N390 Urinary tract infection, site not specified: Secondary | ICD-10-CM | POA: Diagnosis not present

## 2021-09-16 ENCOUNTER — Ambulatory Visit
Admission: RE | Admit: 2021-09-16 | Discharge: 2021-09-16 | Disposition: A | Discharge status: Home / Self Care | Payer: Medicare Other | Source: Ambulatory Visit | Attending: Internal Medicine | Admitting: Internal Medicine

## 2021-09-16 ENCOUNTER — Other Ambulatory Visit: Payer: Self-pay | Admitting: Internal Medicine

## 2021-09-16 DIAGNOSIS — K8020 Calculus of gallbladder without cholecystitis without obstruction: Secondary | ICD-10-CM | POA: Diagnosis not present

## 2021-09-16 DIAGNOSIS — R109 Unspecified abdominal pain: Secondary | ICD-10-CM

## 2021-09-16 DIAGNOSIS — N1330 Unspecified hydronephrosis: Secondary | ICD-10-CM | POA: Diagnosis not present

## 2021-09-17 ENCOUNTER — Other Ambulatory Visit: Payer: Self-pay | Admitting: Urology

## 2021-09-17 DIAGNOSIS — N23 Unspecified renal colic: Secondary | ICD-10-CM | POA: Diagnosis not present

## 2021-09-17 DIAGNOSIS — N132 Hydronephrosis with renal and ureteral calculous obstruction: Secondary | ICD-10-CM | POA: Diagnosis not present

## 2021-09-17 DIAGNOSIS — N201 Calculus of ureter: Secondary | ICD-10-CM | POA: Diagnosis not present

## 2021-09-17 NOTE — Progress Notes (Signed)
Talked with patient. Instructions given. Arrival time 28. Wife is the driver. Stopped ASA on 09/16/21. Clear liquids until 715

## 2021-09-20 ENCOUNTER — Encounter (HOSPITAL_BASED_OUTPATIENT_CLINIC_OR_DEPARTMENT_OTHER): Payer: Self-pay | Admitting: Urology

## 2021-09-20 ENCOUNTER — Ambulatory Visit (HOSPITAL_BASED_OUTPATIENT_CLINIC_OR_DEPARTMENT_OTHER)
Admission: RE | Admit: 2021-09-20 | Discharge: 2021-09-20 | Disposition: A | Discharge status: Home / Self Care | Payer: Medicare Other | Source: Ambulatory Visit | Attending: Urology | Admitting: Urology

## 2021-09-20 ENCOUNTER — Encounter (HOSPITAL_BASED_OUTPATIENT_CLINIC_OR_DEPARTMENT_OTHER)
Admission: RE | Disposition: A | Discharge status: Home / Self Care | Payer: Self-pay | Source: Ambulatory Visit | Attending: Urology

## 2021-09-20 ENCOUNTER — Other Ambulatory Visit: Payer: Self-pay

## 2021-09-20 ENCOUNTER — Ambulatory Visit (HOSPITAL_COMMUNITY): Payer: Medicare Other

## 2021-09-20 DIAGNOSIS — E119 Type 2 diabetes mellitus without complications: Secondary | ICD-10-CM | POA: Insufficient documentation

## 2021-09-20 DIAGNOSIS — I110 Hypertensive heart disease with heart failure: Secondary | ICD-10-CM | POA: Diagnosis not present

## 2021-09-20 DIAGNOSIS — N201 Calculus of ureter: Secondary | ICD-10-CM | POA: Diagnosis not present

## 2021-09-20 DIAGNOSIS — M419 Scoliosis, unspecified: Secondary | ICD-10-CM | POA: Diagnosis not present

## 2021-09-20 DIAGNOSIS — I509 Heart failure, unspecified: Secondary | ICD-10-CM | POA: Diagnosis not present

## 2021-09-20 DIAGNOSIS — N202 Calculus of kidney with calculus of ureter: Secondary | ICD-10-CM | POA: Diagnosis not present

## 2021-09-20 DIAGNOSIS — N200 Calculus of kidney: Secondary | ICD-10-CM | POA: Diagnosis not present

## 2021-09-20 DIAGNOSIS — I878 Other specified disorders of veins: Secondary | ICD-10-CM | POA: Diagnosis not present

## 2021-09-20 HISTORY — PX: EXTRACORPOREAL SHOCK WAVE LITHOTRIPSY: SHX1557

## 2021-09-20 LAB — GLUCOSE, CAPILLARY: Glucose-Capillary: 114 mg/dL — ABNORMAL HIGH (ref 70–99)

## 2021-09-20 SURGERY — LITHOTRIPSY, ESWL
Anesthesia: LOCAL | Laterality: Left

## 2021-09-20 MED ORDER — TAMSULOSIN HCL 0.4 MG PO CAPS: 0.4000 mg | ORAL_CAPSULE | Freq: Every day | ORAL | 1 refills | Status: AC | PRN

## 2021-09-20 MED ORDER — DIPHENHYDRAMINE HCL 25 MG PO CAPS
ORAL_CAPSULE | ORAL | Status: AC
  Filled 2021-09-20: qty 1

## 2021-09-20 MED ORDER — SODIUM CHLORIDE 0.9 % IV SOLN: INTRAVENOUS | Status: DC

## 2021-09-20 MED ORDER — DIAZEPAM 5 MG PO TABS
ORAL_TABLET | ORAL | Status: AC
  Filled 2021-09-20: qty 2

## 2021-09-20 MED ORDER — DIAZEPAM 5 MG PO TABS
10.0000 mg | ORAL_TABLET | ORAL | Status: AC
  Administered 2021-09-20: 10 mg via ORAL

## 2021-09-20 MED ORDER — CIPROFLOXACIN HCL 500 MG PO TABS: 500.0000 mg | ORAL_TABLET | ORAL | Status: DC

## 2021-09-20 MED ORDER — HYDROCODONE-ACETAMINOPHEN 5-325 MG PO TABS: 1.0000 | ORAL_TABLET | Freq: Four times a day (QID) | ORAL | 0 refills | Status: AC | PRN

## 2021-09-20 MED ORDER — DIPHENHYDRAMINE HCL 25 MG PO CAPS
25.0000 mg | ORAL_CAPSULE | ORAL | Status: AC
  Administered 2021-09-20: 25 mg via ORAL

## 2021-09-20 NOTE — Op Note (Signed)
See [employer] operative note scanned into chart. Also because of the size, density, location and other factors that cannot be anticipated I feel this will likely be a staged procedure. This fact supersedes any indication in the scanned [place] stone operative note to the contrary.  Donald Pore MD 09/20/2021, 12:32 PM  [facility]  Pager: [phone]

## 2021-09-20 NOTE — Discharge Instructions (Signed)
1. You should strain your urine and collect all fragments and bring them to your follow up appointment.  °2. You should take your pain medication as needed.  Please call if your pain is severe to the point that it is not controlled with your pain medication. °3. You should call if you develop fever > 101 or persistent nausea or vomiting. °4. Your doctor may prescribe tamsulosin to take to help facilitate stone passage. °

## 2021-09-20 NOTE — H&P (Signed)
H&P   History of Present Illness: Frederick Keller is a 66 y.o. year old with a 6 mm proximal left ureteral stone who presents today for ESWL  Past Medical History:  Diagnosis Date   Arthritis    CHF (congestive heart failure) ([place])    Diabetes mellitus    type 2-diet controlled   Essential hypertension 05/09/2016   Gout    High cholesterol    History of kidney stones    Nodule of right lung    lower-being monitored  and kidney for 5 year     Past Surgical History:  Procedure Laterality Date   ANKLE SURGERY Right 1970   JOINT REPLACEMENT     rt knee-2008   KNEE ARTHROSCOPY Bilateral    x2 right, x1 left   LEFT HEART CATHETERIZATION WITH CORONARY ANGIOGRAM N/A 06/30/2014   Procedure: LEFT HEART CATHETERIZATION WITH CORONARY ANGIOGRAM;  Surgeon: Troy Sine, MD;  Location[facility] CATH LAB;  Service: Cardiovascular;  Laterality: N/A;   LITHOTRIPSY     NASAL SEPTUM SURGERY  1997   NASAL SINUS SURGERY  1972   reconstruction with tonsils   NOSE SURGERY  1960   from horse accident   TONSILLECTOMY  age 67   TOTAL KNEE ARTHROPLASTY Left 02/25/2014   Procedure: LEFT TOTAL KNEE ARTHROPLASTY;  Surgeon: Mauri Pole, MD;  Location: [facility] ORS;  Service: Orthopedics;  Laterality: Left;   TOTAL SHOULDER ARTHROPLASTY Left 01/30/2020   Procedure: TOTAL SHOULDER ARTHROPLASTY;  Surgeon: Justice Britain, MD;  Location: [facility];  Service: Orthopedics;  Laterality: Left;  11mn    Home Medications:  Current Meds  Medication Sig   albuterol (PROVENTIL) (2.5 MG/3ML) 0.083% nebulizer solution Take 3 mLs (2.5 mg total) by nebulization every 4 (four) hours as needed for wheezing or shortness of breath.   aspirin 81 MG tablet Take 81 mg by mouth daily.   ciprofloxacin (CIPRO) 500 MG tablet Take 500 mg by mouth 2 (two) times daily.   cyanocobalamin (,VITAMIN B-12,) 1000 MCG/ML injection Inject 1,000 mcg into the muscle every 30 (thirty) days. 2nd Wednesday of the month.    Allergies:  Allergies   Allergen Reactions   Dilaudid [Hydromorphone Hcl] Nausea And Vomiting   Hydromorphone Nausea Only   Penicillins Hives   Atorvastatin Other (See Comments)   Lisinopril Cough    Family History  Problem Relation Age of Onset   CAD Mother    Heart disease Mother    Dementia Mother    CAD Father    Heart attack Father    Healthy Sister    Healthy Brother    Healthy Sister    Diabetes Brother    Kidney cancer Brother    Healthy Sister     Social History:  reports that he quit smoking about 25 years ago. His smoking use included cigarettes. He has never used smokeless tobacco. He reports that he does not drink alcohol and does not use drugs.  ROS: A complete review of systems was performed.  All systems are negative except for pertinent findings as noted.  Physical Exam:  Vital signs in last 24 hours: BP: ()/()  Arterial Line BP: ()/()  Constitutional:  Alert and oriented, No acute distress Cardiovascular: Regular rate and rhythm, No JVD Respiratory: Normal respiratory effort, Lungs clear bilaterally GI: Abdomen is soft, nontender, nondistended, no abdominal masses GU: No CVA tenderness Lymphatic: No lymphadenopathy Neurologic: Grossly intact, no focal deficits Psychiatric: Normal mood and affect   Laboratory Data:  No results for input(s): "WBC", "HGB", "HCT", "PLT" in the last 72 hours.  No results for input(s): "NA", "K", "CL", "GLUCOSE", "BUN", "CALCIUM", "CREATININE" in the last 72 hours.  Invalid input(s): "CO3"   No results found for this or any previous visit (from the past 24 hour(s)). No results found for this or any previous visit (from the past 240 hour(s)).  Renal Function: No results for input(s): "CREATININE" in the last 168 hours. CrCl cannot be calculated (Patient's most recent lab result is older than the maximum 21 days allowed.).  Radiologic Imaging: No results found.  Assessment:  66 yo M with L proximal ureteral stone  Plan:  L  [facility]  Donald Pore, MD 09/20/2021, 9:25 AM  [facility] Pager: [phone]

## 2021-09-22 ENCOUNTER — Encounter (HOSPITAL_BASED_OUTPATIENT_CLINIC_OR_DEPARTMENT_OTHER): Payer: Self-pay | Admitting: Urology

## 2021-10-04 DIAGNOSIS — M722 Plantar fascial fibromatosis: Secondary | ICD-10-CM | POA: Diagnosis not present

## 2021-10-08 DIAGNOSIS — N202 Calculus of kidney with calculus of ureter: Secondary | ICD-10-CM | POA: Diagnosis not present

## 2021-11-24 DIAGNOSIS — E7800 Pure hypercholesterolemia, unspecified: Secondary | ICD-10-CM | POA: Diagnosis not present

## 2021-11-24 DIAGNOSIS — I110 Hypertensive heart disease with heart failure: Secondary | ICD-10-CM | POA: Diagnosis not present

## 2021-11-24 DIAGNOSIS — N182 Chronic kidney disease, stage 2 (mild): Secondary | ICD-10-CM | POA: Diagnosis not present

## 2021-11-24 DIAGNOSIS — I10 Essential (primary) hypertension: Secondary | ICD-10-CM | POA: Diagnosis not present

## 2021-11-24 DIAGNOSIS — E1122 Type 2 diabetes mellitus with diabetic chronic kidney disease: Secondary | ICD-10-CM | POA: Diagnosis not present

## 2021-12-16 ENCOUNTER — Other Ambulatory Visit: Payer: Self-pay | Admitting: Internal Medicine

## 2021-12-16 DIAGNOSIS — I10 Essential (primary) hypertension: Secondary | ICD-10-CM

## 2021-12-16 DIAGNOSIS — I428 Other cardiomyopathies: Secondary | ICD-10-CM

## 2022-01-17 DIAGNOSIS — G8929 Other chronic pain: Secondary | ICD-10-CM | POA: Diagnosis not present

## 2022-01-17 DIAGNOSIS — M5451 Vertebrogenic low back pain: Secondary | ICD-10-CM | POA: Diagnosis not present

## 2022-02-04 DIAGNOSIS — S61412A Laceration without foreign body of left hand, initial encounter: Secondary | ICD-10-CM | POA: Diagnosis not present

## 2022-02-08 ENCOUNTER — Other Ambulatory Visit: Payer: Self-pay | Admitting: Internal Medicine

## 2022-02-08 DIAGNOSIS — I428 Other cardiomyopathies: Secondary | ICD-10-CM

## 2022-02-08 DIAGNOSIS — I10 Essential (primary) hypertension: Secondary | ICD-10-CM

## 2022-02-08 DIAGNOSIS — L089 Local infection of the skin and subcutaneous tissue, unspecified: Secondary | ICD-10-CM | POA: Diagnosis not present

## 2022-02-10 DIAGNOSIS — Z79899 Other long term (current) drug therapy: Secondary | ICD-10-CM | POA: Diagnosis not present

## 2022-02-10 DIAGNOSIS — M1A09X0 Idiopathic chronic gout, multiple sites, without tophus (tophi): Secondary | ICD-10-CM | POA: Diagnosis not present

## 2022-02-10 DIAGNOSIS — N200 Calculus of kidney: Secondary | ICD-10-CM | POA: Diagnosis not present

## 2022-02-10 DIAGNOSIS — J449 Chronic obstructive pulmonary disease, unspecified: Secondary | ICD-10-CM | POA: Diagnosis not present

## 2022-02-10 DIAGNOSIS — I10 Essential (primary) hypertension: Secondary | ICD-10-CM | POA: Diagnosis not present

## 2022-02-10 DIAGNOSIS — E538 Deficiency of other specified B group vitamins: Secondary | ICD-10-CM | POA: Diagnosis not present

## 2022-02-10 DIAGNOSIS — E1122 Type 2 diabetes mellitus with diabetic chronic kidney disease: Secondary | ICD-10-CM | POA: Diagnosis not present

## 2022-02-10 DIAGNOSIS — E7800 Pure hypercholesterolemia, unspecified: Secondary | ICD-10-CM | POA: Diagnosis not present

## 2022-02-10 DIAGNOSIS — I5020 Unspecified systolic (congestive) heart failure: Secondary | ICD-10-CM | POA: Diagnosis not present

## 2022-02-10 DIAGNOSIS — Z0000 Encounter for general adult medical examination without abnormal findings: Secondary | ICD-10-CM | POA: Diagnosis not present

## 2022-03-22 DIAGNOSIS — H2513 Age-related nuclear cataract, bilateral: Secondary | ICD-10-CM | POA: Diagnosis not present

## 2022-03-22 DIAGNOSIS — H52223 Regular astigmatism, bilateral: Secondary | ICD-10-CM | POA: Diagnosis not present

## 2022-03-22 DIAGNOSIS — H524 Presbyopia: Secondary | ICD-10-CM | POA: Diagnosis not present

## 2022-03-23 ENCOUNTER — Other Ambulatory Visit: Payer: Self-pay | Admitting: Internal Medicine

## 2022-03-23 DIAGNOSIS — I10 Essential (primary) hypertension: Secondary | ICD-10-CM

## 2022-03-23 DIAGNOSIS — I428 Other cardiomyopathies: Secondary | ICD-10-CM

## 2022-03-29 DIAGNOSIS — M545 Low back pain, unspecified: Secondary | ICD-10-CM | POA: Diagnosis not present

## 2022-04-28 DIAGNOSIS — R0989 Other specified symptoms and signs involving the circulatory and respiratory systems: Secondary | ICD-10-CM | POA: Diagnosis not present

## 2022-04-28 DIAGNOSIS — J029 Acute pharyngitis, unspecified: Secondary | ICD-10-CM | POA: Diagnosis not present

## 2022-04-28 DIAGNOSIS — R519 Headache, unspecified: Secondary | ICD-10-CM | POA: Diagnosis not present

## 2022-04-28 DIAGNOSIS — R051 Acute cough: Secondary | ICD-10-CM | POA: Diagnosis not present

## 2022-05-04 ENCOUNTER — Ambulatory Visit: Payer: Medicare Other | Admitting: General Practice

## 2022-05-06 DIAGNOSIS — R051 Acute cough: Secondary | ICD-10-CM | POA: Diagnosis not present

## 2022-05-09 DIAGNOSIS — U071 COVID-19: Secondary | ICD-10-CM | POA: Diagnosis not present

## 2022-05-09 DIAGNOSIS — J1282 Pneumonia due to coronavirus disease 2019: Secondary | ICD-10-CM | POA: Diagnosis not present

## 2022-05-12 DIAGNOSIS — Z8616 Personal history of COVID-19: Secondary | ICD-10-CM | POA: Diagnosis not present

## 2022-05-16 DIAGNOSIS — R739 Hyperglycemia, unspecified: Secondary | ICD-10-CM | POA: Diagnosis not present

## 2022-05-16 DIAGNOSIS — R531 Weakness: Secondary | ICD-10-CM | POA: Diagnosis not present

## 2022-05-16 DIAGNOSIS — R000 Tachycardia, unspecified: Secondary | ICD-10-CM | POA: Diagnosis not present

## 2022-05-16 DIAGNOSIS — R300 Dysuria: Secondary | ICD-10-CM | POA: Diagnosis not present

## 2022-05-16 DIAGNOSIS — R3589 Other polyuria: Secondary | ICD-10-CM | POA: Diagnosis not present

## 2022-05-27 ENCOUNTER — Other Ambulatory Visit: Payer: Self-pay | Admitting: Physician Assistant

## 2022-05-27 ENCOUNTER — Ambulatory Visit
Admission: RE | Admit: 2022-05-27 | Discharge: 2022-05-27 | Disposition: A | Discharge status: Home / Self Care | Payer: Medicare Other | Source: Ambulatory Visit | Attending: Physician Assistant | Admitting: Physician Assistant

## 2022-05-27 DIAGNOSIS — Z8616 Personal history of COVID-19: Secondary | ICD-10-CM

## 2022-05-27 DIAGNOSIS — J986 Disorders of diaphragm: Secondary | ICD-10-CM | POA: Diagnosis not present

## 2022-05-27 DIAGNOSIS — U071 COVID-19: Secondary | ICD-10-CM | POA: Diagnosis not present

## 2022-05-29 NOTE — Progress Notes (Unsigned)
Cardiology Clinic Note   Patient Name: Frederick Keller Date of Encounter: 06/01/2022  Primary Care Provider:  Lavone Orn, MD Primary Cardiologist:  Pixie Casino, MD  Patient Profile    Frederick Keller 67 year old male presents the clinic today for follow-up evaluation of his nonischemic cardiomyopathy.  Past Medical History    Past Medical History:  Diagnosis Date   Arthritis    CHF (congestive heart failure) ([place])    Diabetes mellitus    type 2-diet controlled   Essential hypertension 05/09/2016   Gout    High cholesterol    History of kidney stones    Nodule of right lung    lower-being monitored  and kidney for 5 year    Past Surgical History:  Procedure Laterality Date   ANKLE SURGERY Right 1970   EXTRACORPOREAL SHOCK WAVE LITHOTRIPSY Left 09/20/2021   Procedure: EXTRACORPOREAL SHOCK WAVE LITHOTRIPSY ([facility]);  Surgeon: Vira Agar, MD;  Location: [facility];  Service: Urology;  Laterality: Left;   JOINT REPLACEMENT     rt knee-2008   KNEE ARTHROSCOPY Bilateral    x2 right, x1 left   LEFT HEART CATHETERIZATION WITH CORONARY ANGIOGRAM N/A 06/30/2014   Procedure: LEFT HEART CATHETERIZATION WITH CORONARY ANGIOGRAM;  Surgeon: Troy Sine, MD;  Location: [facility] CATH LAB;  Service: Cardiovascular;  Laterality: N/A;   LITHOTRIPSY     NASAL SEPTUM SURGERY  1997   NASAL SINUS SURGERY  1972   reconstruction with tonsils   NOSE SURGERY  1960   from horse accident   TONSILLECTOMY  age 51   TOTAL KNEE ARTHROPLASTY Left 02/25/2014   Procedure: LEFT TOTAL KNEE ARTHROPLASTY;  Surgeon: Mauri Pole, MD;  Location: [facility];  Service: Orthopedics;  Laterality: Left;   TOTAL SHOULDER ARTHROPLASTY Left 01/30/2020   Procedure: TOTAL SHOULDER ARTHROPLASTY;  Surgeon: Justice Britain, MD;  Location: [facility];  Service: Orthopedics;  Laterality: Left;  126mn    Allergies  Allergies  Allergen Reactions   Dilaudid [Hydromorphone Hcl] Nausea And Vomiting    Hydromorphone Nausea Only   Penicillins Hives   Atorvastatin Other (See Comments)   Lisinopril Cough    History of Present Illness    Frederick KOVACEVICHhas a PMH of paroxysmal SVT, nonischemic cardiomyopathy, essential hypertension, HTN, diabetes mellitus type 2, CKD stage II, moderate obesity, Puzio due to, vitamin B12 deficiency, and restless leg syndrome.  He previously presented to the hospital with chest pain and an echocardiogram found his EF to be 25-30%.  He underwent cardiac catheterization which showed an EF of 45% and patent coronary arteries.  He was noted to have an episode of SVT with a heart rate of 170 during his hospitalization.  The patient was not aware of any other episodes.  It was felt that his SVT may be contributing to his nonischemic cardiomyopathy.  He was placed on metoprolol and his lisinopril was continued.  On follow-up he denied chest pain and shortness of breath.  He developed a dry nagging cough.  It was felt that this may be related to his lisinopril 2.5 mg that he was taking twice daily.  A repeat echo showed an EF of 50-55%.  He was encouraged to continue with his heart failure medications.  Subsequently he was switched from lisinopril to an ARB.  He reported that he felt like his cough became worse.  Dr. HDebara Pickettfelt that there may be other reasons for his cough.  He eventually stopped the medication  and his cough went away.  His lisinopril was restarted.  He denied side effects from the medication.  He presented for follow-up on 01/14/2020.  During that time he continued to do well.  He was highly active 8/21 and developed some acute inflammatory arthritis of his left shoulder.  It was felt that he may need a shoulder replacement.  He was scheduled to see orthopedics for further evaluation and treatment.  He presented to the cardiology clinic for preoperative cardiac evaluation.  During that time he denied shortness of breath and was able to walk up 2 flights of stairs  without stopping.  He denied exertional chest discomfort.  His echocardiogram in 2016 showed normalized LV function and he was felt to be NYHA class I.  He presented the clinic 01/27/2021 for follow-up evaluation and stated he felt well .  He had recently purchased his father's tractor and was restoring it.  He had the tractor running and plan to have it repainted.  It is a international.  We discussed his echocardiogram and previous angiography.  He reported that his father died of an MI .  He tolerated his left shoulder surgery well.  Follow-up was planned for 12 months.  He presents to the clinic today for follow-up evaluation and states he recently got over a case of COVID-19.  The infection developed into pneumonia.  He required steroids for several days.  He is feeling much better now.  He is starting to get back to his normal daily activities.  He continues to notice some weakness and reduced endurance.  We reviewed his previous office visit.  He presents with his wife.  His blood pressure is well-controlled.  He has lost about 22 pounds.  His EKG today shows normal sinus rhythm no ectopy 87 bpm.  He had recent labs drawn with his PCP.  We will refill his cardiac medications.  I will give him the salty 6 diet sheet, have him increase his physical activity as tolerated and plan follow-up in 12 months.  Today he denies chest pain, shortness of breath, lower extremity edema, fatigue, palpitations, melena, hematuria, hemoptysis, diaphoresis, weakness, presyncope, syncope, orthopnea, and PND.     Home Medications    Prior to Admission medications   Medication Sig Start Date End Date Taking? Authorizing Provider  allopurinol (ZYLOPRIM) 300 MG tablet Take 300 mg by mouth at bedtime.     [provider]  aspirin 81 MG tablet Take 81 mg by mouth daily.    [provider]  cholecalciferol (VITAMIN D) 1000 UNITS tablet Take 1,000 Units by mouth at bedtime.     [provider]   cyanocobalamin (,VITAMIN B-12,) 1000 MCG/ML injection Inject 1,000 mcg into the muscle every 30 (thirty) days. 2nd Wednesday of the month.    [provider]  cyclobenzaprine (FLEXERIL) 5 MG tablet 1 tablet 06/15/19   [provider]  fexofenadine (ALLEGRA) 180 MG tablet Take 180 mg by mouth daily.    [provider]  indomethacin (INDOCIN) 50 MG capsule 1 capsule with food or milk 01/15/20   [provider]  losartan (COZAAR) 25 MG tablet Take 12.5 mg by mouth at bedtime. 1/2 TABLET (12.'5MG'$ ) DAILY     [provider]  metoprolol tartrate (LOPRESSOR) 25 MG tablet TAKE 1 TABLET BY MOUTH  TWICE DAILY 07/07/20   Hilty, Nadean Corwin, MD  pravastatin (PRAVACHOL) 40 MG tablet Take 40 mg by mouth daily.    [provider]  predniSONE (DELTASONE)  10 MG tablet Take by mouth. 05/06/20   [provider]  tamsulosin (FLOMAX) 0.4 MG CAPS capsule Take 0.4 mg by mouth daily as needed (kidney stones).     [provider]    Family History    Family History  Problem Relation Age of Onset   CAD Mother    Heart disease Mother    Dementia Mother    CAD Father    Heart attack Father    Healthy Sister    Healthy Brother    Healthy Sister    Diabetes Brother    Kidney cancer Brother    Healthy Sister    He indicated that his mother is alive. He indicated that his father is deceased. He indicated that all of his three sisters are alive. He indicated that both of his brothers are alive.   Social History    Social History   Socioeconomic History   Marital status: Married    Spouse name: Not on file   Number of children: Not on file   Years of education: Not on file   Highest education level: Not on file  Occupational History   Not on file  Tobacco Use   Smoking status: Former    Years: 30.00    Types: Cigarettes    Quit date: 04/04/1996    Years since quitting: 26.1   Smokeless tobacco: Never  Vaping Use   Vaping Use: Never used   Substance and Sexual Activity   Alcohol use: No   Drug use: No   Sexual activity: Not Currently  Other Topics Concern   Not on file  Social History Narrative   Not on file   Social Determinants of Health   Financial Resource Strain: Not on file  Food Insecurity: Not on file  Transportation Needs: Not on file  Physical Activity: Not on file  Stress: Not on file  Social Connections: Not on file  Intimate Partner Violence: Not on file     Review of Systems    General:  No chills, fever, night sweats or weight changes.  Cardiovascular:  No chest pain, dyspnea on exertion, edema, orthopnea, palpitations, paroxysmal nocturnal dyspnea. Dermatological: No rash, lesions/masses Respiratory: No cough, dyspnea Urologic: No hematuria, dysuria Abdominal:   No nausea, vomiting, diarrhea, bright red blood per rectum, melena, or hematemesis Neurologic:  No visual changes, wkns, changes in mental status. All other systems reviewed and are otherwise negative except as noted above.  Physical Exam    VS:  BP 120/80 (BP Location: Left Arm, Patient Position: Sitting, Cuff Size: Large)   Pulse 87   Wt 225 lb 9.6 oz (102.3 kg)   BMI 33.32 kg/m  , BMI Body mass index is 33.32 kg/m. GEN: Well nourished, well developed, in no acute distress. HEENT: normal. Neck: Supple, no JVD, carotid bruits, or masses. Cardiac: RRR, no murmurs, rubs, or gallops. No clubbing, cyanosis, edema.  Radials/DP/PT 2+ and equal bilaterally.  Respiratory:  Respirations regular and unlabored, clear to auscultation bilaterally. GI: Soft, nontender, nondistended, BS + x 4. MS: no deformity or atrophy. Skin: warm and dry, no rash. Neuro:  Strength and sensation are intact. Psych: Normal affect.  Accessory Clinical Findings    Recent Labs: 06/01/2021: ALT 28; B Natriuretic Peptide 73.9; BUN 19; Creatinine, Ser 1.04; Hemoglobin 16.7; Platelets 149; Potassium 5.1; Sodium 133   Recent Lipid Panel    Component Value  Date/Time   CHOL 168 08/06/2019 1435   TRIG 139 08/06/2019 1435  HDL 38 (L) 08/06/2019 1435   CHOLHDL 4.4 08/06/2019 1435   CHOLHDL 4.5 06/29/2014 0649   VLDL 27 06/29/2014 0649   LDLCALC 105 (H) 08/06/2019 1435    ECG personally reviewed by me today-normal sinus rhythm no ectopy 87 bpm  EKG 01/27/2021 normal sinus rhythm nonspecific T wave abnormality prolonged QT 68 bpm- No acute changes  Echocardiogram 09/30/2014  Study Conclusions   - Left ventricle: The cavity size was normal. Wall thickness was    normal. Systolic function was normal. The estimated ejection    fraction was in the range of 50% to 55%. Doppler parameters are    consistent with abnormal left ventricular relaxation (grade 1    diastolic dysfunction). The E/e&' ratio is <8, suggesting normal    LV filling pressure.  - Left atrium: The atrium was normal in size.   Impressions:   - Compared to the prior echo in 06/2014, the EF has improved to    50-55%.  Assessment & Plan   1.  Nonischemic cardiomyopathy-remains active.  No dyspnea.  NYHA class I.  Echocardiogram 2016 showed EF 50-55% with G1 DD.   Continue losartan, metoprolol Heart healthy low-sodium diet-reviewed  Obesity-weight today 225.6 pounds.   Heart healthy low-sodium diet-salty 6 given Increase physical activity as tolerated Continue weight loss  Dyslipidemia-LDL 101 on 11/23 Continue aspirin, pravastatin Heart healthy low-sodium high-fiber diet-reviewed Increase physical activity as tolerated Follows with PCP  Disposition: Follow-up with Dr. Debara Pickett or myself in 12 months.  Jossie Ng. Bethzaida Boord NP-C    06/01/2022, 11:37 AM [place] [address] Office [phone] Fax [phone]  Notice: This dictation was prepared with Dragon dictation along with smaller phrase technology. Any transcriptional errors that result from this process are unintentional and may not be corrected upon review.  I spent  14 minutes examining this patient, reviewing medications, and using patient centered shared decision making involving her cardiac care.  Prior to her visit I spent greater than 20 minutes reviewing her past medical history,  medications, and prior cardiac tests.

## 2022-05-30 DIAGNOSIS — R8279 Other abnormal findings on microbiological examination of urine: Secondary | ICD-10-CM | POA: Diagnosis not present

## 2022-05-30 DIAGNOSIS — D72828 Other elevated white blood cell count: Secondary | ICD-10-CM | POA: Diagnosis not present

## 2022-06-01 ENCOUNTER — Ambulatory Visit: Payer: Medicare Other | Attending: General Practice | Admitting: General Practice

## 2022-06-01 ENCOUNTER — Encounter: Payer: Self-pay | Admitting: General Practice

## 2022-06-01 VITALS — BP 120/80 | HR 87 | Wt 225.6 lb

## 2022-06-01 DIAGNOSIS — I428 Other cardiomyopathies: Secondary | ICD-10-CM | POA: Diagnosis not present

## 2022-06-01 DIAGNOSIS — E782 Mixed hyperlipidemia: Secondary | ICD-10-CM

## 2022-06-01 DIAGNOSIS — I10 Essential (primary) hypertension: Secondary | ICD-10-CM | POA: Diagnosis not present

## 2022-06-01 DIAGNOSIS — E668 Other obesity: Secondary | ICD-10-CM | POA: Diagnosis not present

## 2022-06-01 MED ORDER — METOPROLOL TARTRATE 25 MG PO TABS: 25.0000 mg | ORAL_TABLET | Freq: Two times a day (BID) | ORAL | 3 refills | Status: DC

## 2022-06-01 MED ORDER — PRAVASTATIN SODIUM 40 MG PO TABS: 40.0000 mg | ORAL_TABLET | Freq: Every day | ORAL | 3 refills | Status: DC

## 2022-06-01 MED ORDER — LOSARTAN POTASSIUM 25 MG PO TABS
12.5000 mg | ORAL_TABLET | Freq: Every day | ORAL | 3 refills | Status: DC
Start: 2022-06-01 — End: 2023-03-30

## 2022-06-01 NOTE — Patient Instructions (Signed)
Medication Instructions:  The current medical regimen is effective;  continue present plan and medications as directed. Please refer to the Current Medication list given to you today.  *If you need a refill on your cardiac medications before your next appointment, please call your pharmacy*  Lab Work: NONE If you have labs (blood work) drawn today and your tests are completely normal, you will receive your results only by: [place] (if you have MyChart) OR A paper copy in the mail If you have any lab test that is abnormal or we need to change your treatment, we will call you to review the results.  Testing/Procedures: NONE  Follow-Up: At [facility], you and your health needs are our priority.  As part of our continuing mission to provide you with exceptional heart care, we have created designated Provider Care Teams.  These Care Teams include your primary Cardiologist (physician) and Advanced Practice Providers (APPs -  Physician Assistants and Nurse Practitioners) who all work together to provide you with the care you need, when you need it.  Your next appointment:   12 month(s)  Provider:   Pixie Casino, MD     Other Instructions PLEASE READ AND FOLLOW ATTACHED  SALTY 6    INCREASE PHYSICAL ACTIVITY-AS TOLERATED

## 2022-06-07 DIAGNOSIS — H25043 Posterior subcapsular polar age-related cataract, bilateral: Secondary | ICD-10-CM | POA: Diagnosis not present

## 2022-06-07 DIAGNOSIS — H2513 Age-related nuclear cataract, bilateral: Secondary | ICD-10-CM | POA: Diagnosis not present

## 2022-06-07 DIAGNOSIS — H2512 Age-related nuclear cataract, left eye: Secondary | ICD-10-CM | POA: Diagnosis not present

## 2022-06-07 DIAGNOSIS — H18413 Arcus senilis, bilateral: Secondary | ICD-10-CM | POA: Diagnosis not present

## 2022-06-07 DIAGNOSIS — H25013 Cortical age-related cataract, bilateral: Secondary | ICD-10-CM | POA: Diagnosis not present

## 2022-08-17 DIAGNOSIS — E538 Deficiency of other specified B group vitamins: Secondary | ICD-10-CM | POA: Diagnosis not present

## 2022-08-17 DIAGNOSIS — I5020 Unspecified systolic (congestive) heart failure: Secondary | ICD-10-CM | POA: Diagnosis not present

## 2022-08-17 DIAGNOSIS — M545 Low back pain, unspecified: Secondary | ICD-10-CM | POA: Diagnosis not present

## 2022-08-17 DIAGNOSIS — J449 Chronic obstructive pulmonary disease, unspecified: Secondary | ICD-10-CM | POA: Diagnosis not present

## 2022-08-17 DIAGNOSIS — I471 Supraventricular tachycardia, unspecified: Secondary | ICD-10-CM | POA: Diagnosis not present

## 2022-08-17 DIAGNOSIS — I130 Hypertensive heart and chronic kidney disease with heart failure and stage 1 through stage 4 chronic kidney disease, or unspecified chronic kidney disease: Secondary | ICD-10-CM | POA: Diagnosis not present

## 2022-08-17 DIAGNOSIS — I10 Essential (primary) hypertension: Secondary | ICD-10-CM | POA: Diagnosis not present

## 2022-08-17 DIAGNOSIS — E7800 Pure hypercholesterolemia, unspecified: Secondary | ICD-10-CM | POA: Diagnosis not present

## 2022-08-17 DIAGNOSIS — E1122 Type 2 diabetes mellitus with diabetic chronic kidney disease: Secondary | ICD-10-CM | POA: Diagnosis not present

## 2022-08-17 DIAGNOSIS — G8929 Other chronic pain: Secondary | ICD-10-CM | POA: Diagnosis not present

## 2022-08-17 DIAGNOSIS — Z79899 Other long term (current) drug therapy: Secondary | ICD-10-CM | POA: Diagnosis not present

## 2022-08-24 DIAGNOSIS — H2512 Age-related nuclear cataract, left eye: Secondary | ICD-10-CM | POA: Diagnosis not present

## 2022-08-25 DIAGNOSIS — H2511 Age-related nuclear cataract, right eye: Secondary | ICD-10-CM | POA: Diagnosis not present

## 2022-08-31 ENCOUNTER — Ambulatory Visit (INDEPENDENT_AMBULATORY_CARE_PROVIDER_SITE_OTHER): Payer: Medicare Other | Admitting: Podiatry

## 2022-08-31 ENCOUNTER — Encounter: Payer: Self-pay | Admitting: Podiatry

## 2022-08-31 DIAGNOSIS — M722 Plantar fascial fibromatosis: Secondary | ICD-10-CM

## 2022-08-31 MED ORDER — TRIAMCINOLONE ACETONIDE 10 MG/ML IJ SUSP
10.0000 mg | Freq: Once | INTRAMUSCULAR | Status: AC
  Administered 2022-08-31: 10 mg

## 2022-08-31 NOTE — Progress Notes (Signed)
Subjective:   Patient ID: Frederick Keller, male   DOB: 67 y.o.   MRN: [id number]   HPI Patient presents with pain in the right plantar heel stating it has been very tender for the last couple days and does not remember injury   ROS      Objective:  Physical Exam  Neurovascular status intact inflammation pain around the plantar heel region right     Assessment:  Acute Planter fasciitis right inflammation fluid noted     Plan:  X-ray reviewed sterile prep injected the plantar fascia right 3 mg Kenalog 5 mg Xylocaine and advised on reduced activity for the next couple weeks  X-rays indicate spur no indication stress fracture arthritis

## 2022-09-14 DIAGNOSIS — H2511 Age-related nuclear cataract, right eye: Secondary | ICD-10-CM | POA: Diagnosis not present

## 2022-10-06 DIAGNOSIS — J069 Acute upper respiratory infection, unspecified: Secondary | ICD-10-CM | POA: Diagnosis not present

## 2022-10-19 DIAGNOSIS — S0501XA Injury of conjunctiva and corneal abrasion without foreign body, right eye, initial encounter: Secondary | ICD-10-CM | POA: Diagnosis not present

## 2023-01-02 DIAGNOSIS — M109 Gout, unspecified: Secondary | ICD-10-CM | POA: Diagnosis not present

## 2023-01-06 DIAGNOSIS — R051 Acute cough: Secondary | ICD-10-CM | POA: Diagnosis not present

## 2023-01-06 DIAGNOSIS — Z03818 Encounter for observation for suspected exposure to other biological agents ruled out: Secondary | ICD-10-CM | POA: Diagnosis not present

## 2023-01-06 DIAGNOSIS — J069 Acute upper respiratory infection, unspecified: Secondary | ICD-10-CM | POA: Diagnosis not present

## 2023-01-16 ENCOUNTER — Ambulatory Visit: Payer: Medicare Other | Admitting: Podiatry

## 2023-01-16 ENCOUNTER — Encounter: Payer: Self-pay | Admitting: Podiatry

## 2023-01-16 DIAGNOSIS — M722 Plantar fascial fibromatosis: Secondary | ICD-10-CM | POA: Diagnosis not present

## 2023-01-16 MED ORDER — TRIAMCINOLONE ACETONIDE 10 MG/ML IJ SUSP
10.0000 mg | Freq: Once | INTRAMUSCULAR | Status: AC
  Administered 2023-01-16: 10 mg via INTRA_ARTICULAR

## 2023-01-16 NOTE — Progress Notes (Signed)
Subjective:   Patient ID: Frederick Keller, male   DOB: 67 y.o.   MRN: [id number]   HPI Patient states that he has had a flareup of pain in his right plantar heel that it was doing well but he was active and it started hurting   ROS      Objective:  Physical Exam  Neurovascular status intact inflammation of the medial central band of the plantar fascia right     Assessment:  Acute plantar fasciitis right inflammation fluid medial band     Plan:  H&P reviewed and I have recommended conservative sterile prep injected the fascia 3 mg Kenalog 5 mg Xylocaine and applied sterile dressing

## 2023-01-19 DIAGNOSIS — M109 Gout, unspecified: Secondary | ICD-10-CM | POA: Diagnosis not present

## 2023-01-19 DIAGNOSIS — M25572 Pain in left ankle and joints of left foot: Secondary | ICD-10-CM | POA: Diagnosis not present

## 2023-01-19 DIAGNOSIS — U099 Post covid-19 condition, unspecified: Secondary | ICD-10-CM | POA: Diagnosis not present

## 2023-01-19 DIAGNOSIS — M25571 Pain in right ankle and joints of right foot: Secondary | ICD-10-CM | POA: Diagnosis not present

## 2023-01-19 DIAGNOSIS — I471 Supraventricular tachycardia, unspecified: Secondary | ICD-10-CM | POA: Diagnosis not present

## 2023-01-19 DIAGNOSIS — M25521 Pain in right elbow: Secondary | ICD-10-CM | POA: Diagnosis not present

## 2023-02-06 ENCOUNTER — Other Ambulatory Visit: Payer: Self-pay | Admitting: General Practice

## 2023-02-06 DIAGNOSIS — I428 Other cardiomyopathies: Secondary | ICD-10-CM

## 2023-02-06 DIAGNOSIS — I10 Essential (primary) hypertension: Secondary | ICD-10-CM

## 2023-02-21 DIAGNOSIS — Z0000 Encounter for general adult medical examination without abnormal findings: Secondary | ICD-10-CM | POA: Diagnosis not present

## 2023-02-21 DIAGNOSIS — J449 Chronic obstructive pulmonary disease, unspecified: Secondary | ICD-10-CM | POA: Diagnosis not present

## 2023-02-21 DIAGNOSIS — E1122 Type 2 diabetes mellitus with diabetic chronic kidney disease: Secondary | ICD-10-CM | POA: Diagnosis not present

## 2023-02-21 DIAGNOSIS — E7800 Pure hypercholesterolemia, unspecified: Secondary | ICD-10-CM | POA: Diagnosis not present

## 2023-02-21 DIAGNOSIS — Z79899 Other long term (current) drug therapy: Secondary | ICD-10-CM | POA: Diagnosis not present

## 2023-02-21 DIAGNOSIS — I471 Supraventricular tachycardia, unspecified: Secondary | ICD-10-CM | POA: Diagnosis not present

## 2023-02-21 DIAGNOSIS — I130 Hypertensive heart and chronic kidney disease with heart failure and stage 1 through stage 4 chronic kidney disease, or unspecified chronic kidney disease: Secondary | ICD-10-CM | POA: Diagnosis not present

## 2023-02-21 DIAGNOSIS — Z8739 Personal history of other diseases of the musculoskeletal system and connective tissue: Secondary | ICD-10-CM | POA: Diagnosis not present

## 2023-02-21 DIAGNOSIS — Z8616 Personal history of COVID-19: Secondary | ICD-10-CM | POA: Diagnosis not present

## 2023-02-21 DIAGNOSIS — I5022 Chronic systolic (congestive) heart failure: Secondary | ICD-10-CM | POA: Diagnosis not present

## 2023-03-28 ENCOUNTER — Other Ambulatory Visit: Payer: Self-pay | Admitting: General Practice

## 2023-04-07 ENCOUNTER — Other Ambulatory Visit: Payer: Self-pay | Admitting: General Practice

## 2023-04-07 DIAGNOSIS — I10 Essential (primary) hypertension: Secondary | ICD-10-CM

## 2023-04-07 DIAGNOSIS — I428 Other cardiomyopathies: Secondary | ICD-10-CM

## 2023-04-12 DIAGNOSIS — M25511 Pain in right shoulder: Secondary | ICD-10-CM | POA: Diagnosis not present

## 2023-05-04 DIAGNOSIS — M25511 Pain in right shoulder: Secondary | ICD-10-CM | POA: Diagnosis not present

## 2023-05-23 DIAGNOSIS — E1122 Type 2 diabetes mellitus with diabetic chronic kidney disease: Secondary | ICD-10-CM | POA: Diagnosis not present

## 2023-05-29 DIAGNOSIS — M25511 Pain in right shoulder: Secondary | ICD-10-CM | POA: Diagnosis not present

## 2023-06-06 ENCOUNTER — Other Ambulatory Visit: Payer: Self-pay | Admitting: General Practice

## 2023-06-09 ENCOUNTER — Encounter: Payer: Self-pay | Admitting: Emergency Medicine

## 2023-06-23 ENCOUNTER — Other Ambulatory Visit: Payer: Self-pay | Admitting: Internal Medicine

## 2023-06-23 DIAGNOSIS — I10 Essential (primary) hypertension: Secondary | ICD-10-CM

## 2023-06-23 DIAGNOSIS — I428 Other cardiomyopathies: Secondary | ICD-10-CM

## 2023-07-07 ENCOUNTER — Other Ambulatory Visit: Payer: Self-pay | Admitting: Internal Medicine

## 2023-08-08 DIAGNOSIS — J449 Chronic obstructive pulmonary disease, unspecified: Secondary | ICD-10-CM | POA: Diagnosis not present

## 2023-08-08 DIAGNOSIS — J069 Acute upper respiratory infection, unspecified: Secondary | ICD-10-CM | POA: Diagnosis not present

## 2023-08-22 DIAGNOSIS — E7800 Pure hypercholesterolemia, unspecified: Secondary | ICD-10-CM | POA: Diagnosis not present

## 2023-08-22 DIAGNOSIS — I10 Essential (primary) hypertension: Secondary | ICD-10-CM | POA: Diagnosis not present

## 2023-08-22 DIAGNOSIS — I5020 Unspecified systolic (congestive) heart failure: Secondary | ICD-10-CM | POA: Diagnosis not present

## 2023-08-22 DIAGNOSIS — E1122 Type 2 diabetes mellitus with diabetic chronic kidney disease: Secondary | ICD-10-CM | POA: Diagnosis not present

## 2023-08-22 DIAGNOSIS — J449 Chronic obstructive pulmonary disease, unspecified: Secondary | ICD-10-CM | POA: Diagnosis not present

## 2023-08-30 DIAGNOSIS — M25511 Pain in right shoulder: Secondary | ICD-10-CM | POA: Diagnosis not present

## 2023-09-21 DIAGNOSIS — M25511 Pain in right shoulder: Secondary | ICD-10-CM | POA: Diagnosis not present

## 2023-09-22 DIAGNOSIS — S81802A Unspecified open wound, left lower leg, initial encounter: Secondary | ICD-10-CM | POA: Diagnosis not present

## 2023-09-27 DIAGNOSIS — S80812A Abrasion, left lower leg, initial encounter: Secondary | ICD-10-CM | POA: Diagnosis not present

## 2023-09-27 DIAGNOSIS — L089 Local infection of the skin and subcutaneous tissue, unspecified: Secondary | ICD-10-CM | POA: Diagnosis not present

## 2023-09-27 DIAGNOSIS — Z5189 Encounter for other specified aftercare: Secondary | ICD-10-CM | POA: Diagnosis not present

## 2023-09-29 DIAGNOSIS — M25562 Pain in left knee: Secondary | ICD-10-CM | POA: Diagnosis not present

## 2023-09-29 DIAGNOSIS — S81802A Unspecified open wound, left lower leg, initial encounter: Secondary | ICD-10-CM | POA: Diagnosis not present

## 2023-10-02 DIAGNOSIS — M19011 Primary osteoarthritis, right shoulder: Secondary | ICD-10-CM | POA: Diagnosis not present

## 2023-10-04 ENCOUNTER — Telehealth: Payer: Self-pay | Admitting: *Deleted

## 2023-10-04 DIAGNOSIS — S81812A Laceration without foreign body, left lower leg, initial encounter: Secondary | ICD-10-CM | POA: Diagnosis not present

## 2023-10-04 NOTE — Telephone Encounter (Signed)
 Pt has been scheduled in office 10/16/23 with Olivia Pavy, PAC. Pt has been given address of our new location [address]  I will update all parties involved of appt for preop clearance.

## 2023-10-04 NOTE — Telephone Encounter (Signed)
   Pre-operative Risk Assessment    Patient Name: Frederick Keller  DOB: [birth year] MRN: [id number]   Date of last office visit: 06/01/22 JOSEFA BEAUVAIS, FNP Date of next office visit: NONE   Request for Surgical Clearance    Procedure:  RIGHT ANATOMIC TOTAL SHOULDER ARTHROPLASTY  Date of Surgery:  Clearance TBD                                Surgeon:  DR. KEVIN SUPPLE Surgeon's Group or Practice Name:  [facility] Phone number:  [phone] Fax number:  [phone] LORI STONE   Type of Clearance Requested:   - Medical  - Pharmacy:  Hold Aspirin      Type of Anesthesia:  Not Indicated   Additional requests/questions:    Bonney Niels Jest   10/04/2023, 10:34 AM

## 2023-10-04 NOTE — Telephone Encounter (Signed)
   Name: Frederick Keller  DOB: February 24, 1956  MRN: [id number]  Primary Cardiologist: [name], MD  Chart reviewed as part of pre-operative protocol coverage. Because of Frederick Keller's past medical history and time since last visit, Frederick Keller will require a follow-up in-office visit in order to better assess preoperative cardiovascular risk. Last seen by Josefa Beauvais on 06/01/2022  Pre-op covering staff: - Please schedule appointment and call patient to inform them. If patient already had an upcoming appointment within acceptable timeframe, please add pre-op clearance to the appointment notes so provider is aware. - Please contact requesting surgeon's office via preferred method (i.e, phone, fax) to inform them of need for appointment prior to surgery.  Per office protocol, if patient is without any new symptoms or concerns at the time of their virtual visit, Frederick Keller may hold ASA for 7 days prior to procedure. Please resume ASA as soon as possible postprocedure, at the discretion of the surgeon.    Lamarr Satterfield, NP  10/04/2023, 10:57 AM

## 2023-10-09 NOTE — Progress Notes (Signed)
 Cardiology Office Note:  .   Date:  10/16/2023  ID:  Lynwood JAYSON Gu, DOB 23-Jul-1955, MRN [id number] PCP: Dwight Trula SQUIBB, MD  [facility] Providers Cardiologist:  [name], MD    History of Present Illness: .   Frederick Keller is a 68 y.o. male  with a PMH of paroxysmal SVT, nonischemic cardiomyopathy, essential hypertension, diabetes mellitus type 2, CKD stage II, moderate obesity, vitamin B12 deficiency, and restless leg syndrome. He previously presented to the hospital with chest pain and an echocardiogram found his EF to be 25-30%. He underwent cardiac catheterization which showed an EF of 45% and patent coronary arteries. He was noted to have an episode of SVT with a heart rate of 170 during his hospitalization.  It was felt that his SVT may be contributing to his nonischemic cardiomyopathy. He was placed on metoprolol  and his lisinopril  was continued.  A repeat echo showed an EF of 50-55%.  Patient here for preop clearance for surgery in Oct. Denies chest pain, dyspnea, edema, palpitations. 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  ROS:    Studies Reviewed: SABRA    EKG Interpretation Date/Time:  Monday October 16 2023 08:35:43 EDT Ventricular Rate:  60 PR Interval:  170 QRS Duration:  84 QT Interval:  456 QTC Calculation: 456 R Axis:   17  Text Interpretation: Normal sinus rhythm Normal ECG When compared with ECG of 01-Jun-2021 12:39, Vent. rate has decreased BY  33 BPM Confirmed by Parthenia Klinefelter [phone]) on 10/16/2023 8:51:32 AM    Prior CV Studies:   Echocardiogram 09/30/2014   Study Conclusions   - Left ventricle: The cavity size was normal. Wall thickness was    normal. Systolic function was normal. The estimated ejection    fraction was in the range of 50% to 55%. Doppler parameters are    consistent with abnormal left ventricular relaxation (grade 1    diastolic dysfunction). The E/e&' ratio is <8, suggesting normal    LV filling pressure.  - Left  atrium: The atrium was normal in size.   Impressions:   - Compared to the prior echo in 06/2014, the EF has improved to    50-55%.   Risk Assessment/Calculations:             Physical Exam:   VS:  BP 120/74   Ht 5' 10 (1.778 m)   Wt 218 lb (98.9 kg)   SpO2 96%   BMI 31.28 kg/m    Orhtostatics: No data found. Wt Readings from Last 3 Encounters:  10/16/23 218 lb (98.9 kg)  06/01/22 225 lb 9.6 oz (102.3 kg)  09/20/21 224 lb (101.6 kg)    GEN: Obesty, in no acute distress NECK: No JVD; No carotid bruits CARDIAC:  RRR, 2/6 systolic murmur LSB RESPIRATORY:  Clear to auscultation without rales, wheezing or rhonchi  ABDOMEN: Soft, non-tender, non-distended EXTREMITIES:  No edema; No deformity   ASSESSMENT AND PLAN: .     Preop clearance for right total shoulder arthroplasty by Dr. Franky Pointer in Oct. Patient doing well from cardiac standpoint, METs>8. Can proceed with above surgery without further cardiac testing. He is advised to call us  if symptoms develop before above surgery.  According to the Revised Cardiac Risk Index (RCRI), his Perioperative Risk of Major Cardiac Event is (%): 0.9  His Functional Capacity in METs is: 8.23 according to the Duke Activity Status Index (DASI).   Nonischemic cardiomyopathy-remains active.  No dyspnea.  NYHA class I.  Echocardiogram 2016 showed EF 50-55% with G1 DD.   Continue losartan , metoprolol  Heart healthy low-sodium diet-reviewed  HTN-BP controlled on losartan  and metoprolol    Obesity-weight today 218 pounds.   Heart healthy low-sodium diet  Increase physical activity as tolerated Continue weight loss   Dyslipidemia-LDL 123 08/2023  Pravastatin  increased by PCP to 40 mg daily. Will have repeat labs through PCP Heart healthy low-sodium high-fiber diet-reviewed Increase physical activity as tolerated, weight loss            Dispo: f/u Dr. Mona in 1 yr.  Signed, Olivia Pavy, PA-C

## 2023-10-16 ENCOUNTER — Ambulatory Visit: Attending: Physician Assistant | Admitting: Physician Assistant

## 2023-10-16 ENCOUNTER — Encounter: Payer: Self-pay | Admitting: Physician Assistant

## 2023-10-16 VITALS — BP 120/74 | Ht 70.0 in | Wt 218.0 lb

## 2023-10-16 DIAGNOSIS — E669 Obesity, unspecified: Secondary | ICD-10-CM | POA: Diagnosis not present

## 2023-10-16 DIAGNOSIS — Z01818 Encounter for other preprocedural examination: Secondary | ICD-10-CM

## 2023-10-16 DIAGNOSIS — I428 Other cardiomyopathies: Secondary | ICD-10-CM | POA: Diagnosis not present

## 2023-10-16 DIAGNOSIS — I10 Essential (primary) hypertension: Secondary | ICD-10-CM

## 2023-10-16 DIAGNOSIS — E782 Mixed hyperlipidemia: Secondary | ICD-10-CM | POA: Diagnosis not present

## 2023-10-16 NOTE — Patient Instructions (Signed)
 Medication Instructions:  Your physician recommends that you continue on your current medications as directed. Please refer to the Current Medication list given to you today.  *If you need a refill on your cardiac medications before your next appointment, please call your pharmacy*  Lab Work: None ordered  If you have labs (blood work) drawn today and your tests are completely normal, you will receive your results only by: MyChart Message (if you have MyChart) OR A paper copy in the mail If you have any lab test that is abnormal or we need to change your treatment, we will call you to review the results.  Testing/Procedures: None ordered  Follow-Up: At [facility], you and your health needs are our priority.  As part of our continuing mission to provide you with exceptional heart care, our providers are all part of one team.  This team includes your primary Cardiologist (physician) and Advanced Practice Providers or APPs (Physician Assistants and Nurse Practitioners) who all work together to provide you with the care you need, when you need it.  Your next appointment:   1 year(s)  Provider:   [name], MD    We recommend signing up for the patient portal called MyChart.  Sign up information is provided on this After Visit Summary.  MyChart is used to connect with patients for Virtual Visits (Telemedicine).  Patients are able to view lab/test results, encounter notes, upcoming appointments, etc.  Non-urgent messages can be sent to your provider as well.   To learn more about what you can do with MyChart, go to [email or web address].   Other Instructions

## 2023-10-18 DIAGNOSIS — S81812D Laceration without foreign body, left lower leg, subsequent encounter: Secondary | ICD-10-CM | POA: Diagnosis not present

## 2023-11-15 DIAGNOSIS — S81812D Laceration without foreign body, left lower leg, subsequent encounter: Secondary | ICD-10-CM | POA: Diagnosis not present

## 2023-11-15 NOTE — Telephone Encounter (Signed)
 Caller Jeryl) is following-up on the status of patient's clearance.

## 2023-11-16 NOTE — Telephone Encounter (Signed)
 Per Chart review, Pre op clearance sent on 11/16/2023 at 09:31 am to Dr. Franky Pointer.   LVM at [phone] to ask for verification of receipt.

## 2023-11-16 NOTE — Telephone Encounter (Signed)
 Copy and pasted from Epic Secure Chat:   Hi Leor Whyte I spoke to [place] from [place] and she wanted to confirm that you sent the fax over to the correct number this morning. SM The fax number she gave me is [phone] Now okay, thank you , I will note in pt's chart that Katheryn confirmed receipt of paper work. thx again!! Now SM Lauraine KANDICE Ada of course! :)

## 2023-11-21 ENCOUNTER — Encounter (HOSPITAL_BASED_OUTPATIENT_CLINIC_OR_DEPARTMENT_OTHER): Admitting: Internal Medicine

## 2023-12-20 DIAGNOSIS — Z23 Encounter for immunization: Secondary | ICD-10-CM | POA: Diagnosis not present

## 2024-01-19 DIAGNOSIS — M19011 Primary osteoarthritis, right shoulder: Secondary | ICD-10-CM | POA: Diagnosis not present

## 2024-01-19 DIAGNOSIS — G8918 Other acute postprocedural pain: Secondary | ICD-10-CM | POA: Diagnosis not present

## 2024-01-19 DIAGNOSIS — M25711 Osteophyte, right shoulder: Secondary | ICD-10-CM | POA: Diagnosis not present

## 2024-01-22 NOTE — Progress Notes (Addendum)
 Frederick Keller                                          MRN: [id number]   02/26/2024   The [facility] Quality Team Specialist reviewed this patient medical record for the purposes of chart review for care gap closure. The following were reviewed: chart review for care gap closure-diabetic eye exam and kidney health evaluation for diabetes:eGFR  and uACR.    [facility]I Quality Team
